# Patient Record
Sex: Male | Born: 1960
Health system: Southern US, Community
[De-identification: ages and names within clinical notes are randomized; demographics above are authoritative.]

## PROBLEM LIST (undated history)

## (undated) DIAGNOSIS — I10 Essential (primary) hypertension: Secondary | ICD-10-CM

## (undated) DIAGNOSIS — IMO0001 Reserved for inherently not codable concepts without codable children: Secondary | ICD-10-CM

## (undated) DIAGNOSIS — R188 Other ascites: Secondary | ICD-10-CM

## (undated) DIAGNOSIS — K7460 Unspecified cirrhosis of liver: Secondary | ICD-10-CM

## (undated) DIAGNOSIS — K439 Ventral hernia without obstruction or gangrene: Secondary | ICD-10-CM

## (undated) HISTORY — DX: Unspecified cirrhosis of liver: K74.60

## (undated) HISTORY — PX: HAND SURGERY: SHX662

---

## 1998-11-19 HISTORY — PX: BACK SURGERY: SHX140

## 2000-12-31 ENCOUNTER — Encounter: Payer: Self-pay | Admitting: Neurosurgery

## 2000-12-31 ENCOUNTER — Ambulatory Visit (HOSPITAL_COMMUNITY): Admission: RE | Admit: 2000-12-31 | Discharge: 2000-12-31 | Payer: Self-pay | Admitting: Neurosurgery

## 2001-01-01 ENCOUNTER — Ambulatory Visit (HOSPITAL_COMMUNITY): Admission: RE | Admit: 2001-01-01 | Discharge: 2001-01-01 | Payer: Self-pay | Admitting: Neurosurgery

## 2001-01-08 ENCOUNTER — Ambulatory Visit (HOSPITAL_COMMUNITY): Admission: RE | Admit: 2001-01-08 | Discharge: 2001-01-08 | Payer: Self-pay | Admitting: Neurosurgery

## 2001-01-08 ENCOUNTER — Encounter: Payer: Self-pay | Admitting: Neurosurgery

## 2001-01-14 ENCOUNTER — Inpatient Hospital Stay (HOSPITAL_COMMUNITY): Admission: RE | Admit: 2001-01-14 | Discharge: 2001-01-15 | Payer: Self-pay | Admitting: Neurosurgery

## 2001-01-14 ENCOUNTER — Encounter: Payer: Self-pay | Admitting: Neurosurgery

## 2015-04-13 ENCOUNTER — Telehealth: Payer: Self-pay | Admitting: Internal Medicine

## 2015-04-13 ENCOUNTER — Emergency Department (HOSPITAL_COMMUNITY)
Admission: EM | Admit: 2015-04-13 | Discharge: 2015-04-13 | Disposition: A | Discharge status: Home / Self Care | Payer: Self-pay | Attending: Emergency Medicine | Admitting: Emergency Medicine

## 2015-04-13 ENCOUNTER — Encounter (HOSPITAL_COMMUNITY): Payer: Self-pay | Admitting: Emergency Medicine

## 2015-04-13 ENCOUNTER — Emergency Department (HOSPITAL_COMMUNITY): Payer: Self-pay

## 2015-04-13 DIAGNOSIS — R05 Cough: Secondary | ICD-10-CM | POA: Insufficient documentation

## 2015-04-13 DIAGNOSIS — K7031 Alcoholic cirrhosis of liver with ascites: Secondary | ICD-10-CM | POA: Insufficient documentation

## 2015-04-13 DIAGNOSIS — Z720 Tobacco use: Secondary | ICD-10-CM | POA: Insufficient documentation

## 2015-04-13 LAB — COMPREHENSIVE METABOLIC PANEL
ALT: 21 U/L (ref 17–63)
AST: 35 U/L (ref 15–41)
Albumin: 2.9 g/dL — ABNORMAL LOW (ref 3.5–5.0)
Alkaline Phosphatase: 78 U/L (ref 38–126)
Anion gap: 7 (ref 5–15)
BUN: 10 mg/dL (ref 6–20)
CO2: 26 mmol/L (ref 22–32)
Calcium: 8.4 mg/dL — ABNORMAL LOW (ref 8.9–10.3)
Chloride: 103 mmol/L (ref 101–111)
Creatinine, Ser: 0.79 mg/dL (ref 0.61–1.24)
GFR calc Af Amer: >60 mL/min (ref >60)
GFR calc non Af Amer: >60 mL/min (ref >60)
Glucose, Bld: 99 mg/dL (ref 65–99)
Potassium: 4.1 mmol/L (ref 3.5–5.1)
Sodium: 136 mmol/L (ref 135–145)
Total Bilirubin: 2.2 mg/dL — ABNORMAL HIGH (ref 0.3–1.2)
Total Protein: 6.4 g/dL — ABNORMAL LOW (ref 6.5–8.1)

## 2015-04-13 LAB — CBC WITH DIFFERENTIAL/PLATELET
Basophils Absolute: 0.1 10*3/uL (ref 0.0–0.1)
Basophils Relative: 1 % (ref 0–1)
Eosinophils Absolute: 0.1 10*3/uL (ref 0.0–0.7)
Eosinophils Relative: 1 % (ref 0–5)
HCT: 40.5 % (ref 39.0–52.0)
Hemoglobin: 13.8 g/dL (ref 13.0–17.0)
Lymphocytes Relative: 30 % (ref 12–46)
Lymphs Abs: 2.3 10*3/uL (ref 0.7–4.0)
MCH: 33.8 pg (ref 26.0–34.0)
MCHC: 34.1 g/dL (ref 30.0–36.0)
MCV: 99.3 fL (ref 78.0–100.0)
Monocytes Absolute: 0.7 10*3/uL (ref 0.1–1.0)
Monocytes Relative: 9 % (ref 3–12)
NEUTROS PCT: 59 % (ref 43–77)
Neutro Abs: 4.5 10*3/uL (ref 1.7–7.7)
PLATELETS: 179 10*3/uL (ref 150–400)
RBC: 4.08 MIL/uL — AB (ref 4.22–5.81)
RDW: 13.4 % (ref 11.5–15.5)
WBC: 7.6 10*3/uL (ref 4.0–10.5)

## 2015-04-13 LAB — BRAIN NATRIURETIC PEPTIDE: B Natriuretic Peptide: 50.1 pg/mL (ref 0.0–100.0)

## 2015-04-13 LAB — I-STAT TROPONIN, ED: Troponin i, poc: 0 ng/mL (ref 0.00–0.08)

## 2015-04-13 MED ORDER — IPRATROPIUM-ALBUTEROL 0.5-2.5 (3) MG/3ML IN SOLN
3.0000 mL | Freq: Once | RESPIRATORY_TRACT | Status: AC
  Administered 2015-04-13: 3 mL via RESPIRATORY_TRACT
  Filled 2015-04-13: qty 3

## 2015-04-13 MED ORDER — IOHEXOL 300 MG/ML  SOLN
100.0000 mL | Freq: Once | INTRAMUSCULAR | Status: AC | PRN
  Administered 2015-04-13: 100 mL via INTRAVENOUS

## 2015-04-13 MED ORDER — SPIRONOLACTONE 50 MG PO TABS: 50.0000 mg | ORAL_TABLET | Freq: Every day | ORAL | Status: DC

## 2015-04-13 MED ORDER — SPIRONOLACTONE 50 MG PO TABS
50.0000 mg | ORAL_TABLET | Freq: Every day | ORAL | Status: DC
  Administered 2015-04-13: 50 mg via ORAL
  Filled 2015-04-13: qty 1

## 2015-04-13 MED ORDER — FUROSEMIDE 40 MG PO TABS: 40.0000 mg | ORAL_TABLET | Freq: Every day | ORAL | Status: DC

## 2015-04-13 NOTE — Discharge Instructions (Signed)
Cirrhosis Cirrhosis is a condition of scarring of the liver which is caused when the liver has tried repairing itself following damage. This damage may come from a previous infection such as one of the forms of hepatitis (usually hepatitis C), or the damage may come from being injured by toxins. The main toxin that causes this damage is alcohol. The scarring of the liver from use of alcohol is irreversible. That means the liver cannot return to normal even though alcohol is not used any more. The main danger of hepatitis C infection is that it may cause long-lasting (chronic) liver disease, and this also may lead to cirrhosis. This complication is progressive and irreversible. CAUSES  Prior to available blood tests, hepatitis C could be contracted by blood transfusions. Since testing of blood has improved, this is now unlikely. This infection can also be contracted through intravenous drug use and the sharing of needles. It can also be contracted through sexual relationships. The injury caused by alcohol comes from too much use. It is not a few drinks that poison the liver, but years of misuse. Usually there will be some signs and symptoms early with scarring of the liver that suggest the development of better habits. Alcohol should never be used while using acetaminophen. A small dose of both taken together may cause irreversible damage to the liver. HOME CARE INSTRUCTIONS  There is no specific treatment for cirrhosis. However, there are things you can do to avoid making the condition worse.  Rest as needed.  Eat a well-balanced diet. Your caregiver can help you with suggestions.  Vitamin supplements including vitamins A, K, D, and thiamine can help.  A low-salt diet, water restriction, or diuretic medicine may be needed to reduce fluid retention.  Avoid alcohol. This can be extremely toxic if combined with acetaminophen.  Avoid drugs which are toxic to the liver. Some of these include isoniazid,  methyldopa, acetaminophen, anabolic steroids (muscle-building drugs), erythromycin, and oral contraceptives (birth control pills). Check with your caregiver to make sure medicines you are presently taking will not be harmful.  Periodic blood tests may be required. Follow your caregiver's advice regarding the timing of these.  Milk thistle is an herbal remedy which does protect the liver against toxins. However, it will not help once the liver has been scarred. SEEK MEDICAL CARE IF:  You have increasing fatigue or weakness.  You develop swelling of the hands, feet, legs, or face.  You vomit bright red blood, or a coffee ground appearing material.  You have blood in your stools, or the stools turn black and tarry.  You have a fever.  You develop loss of appetite, or have nausea and vomiting.  You develop jaundice.  You develop easy bruising or bleeding.  You have worsening of any of the problems you are concerned about. Document Released: 11/05/2005 Document Revised: 01/28/2012 Document Reviewed: 06/23/2008 [facility] Patient Information 2015 [place], [place]. This information is not intended to replace advice given to you by your health care provider. Make sure you discuss any questions you have with your health care provider.  Ascites Ascites is a gathering of fluid in the belly (abdomen). This is most often caused by liver disease. It may also be caused by a number of other less common problems. It causes a ballooning out (distension) of the abdomen. CAUSES  Scarring of the liver (cirrhosis) is the most common cause of ascites. Other causes include:  Infection or inflammation in the abdomen.  Cancer in the abdomen.  Heart failure.  Certain forms of kidney failure (nephritic syndrome).  Inflammation of the pancreas.  Clots in the veins of the liver. SYMPTOMS  In the early stages of ascites, you may not have any symptoms. The main symptom of ascites is a sense of abdominal  bloating. This is due to the presence of fluid. This may also cause an increase in abdominal or waist size. People with this condition can develop swelling in the legs, and men can develop a swollen scrotum. When there is a lot of fluid, it may be hard to breath. Stretching of the abdomen by fluid can be painful. DIAGNOSIS  Certain features of your medical history, such as a history of liver disease and of an enlarging abdomen, can suggest the presence of ascites. The diagnosis of ascites can be made on physical exam by your caregiver. An abdominal ultrasound examination can confirm that ascites is present, and estimate the amount of fluid. Once ascites is confirmed, it is important to determine its cause. Again, a history of one of the conditions listed in "CAUSES" provides a strong clue. A physical exam is important, and blood and X-ray tests may be needed. During a procedure called paracentesis, a sample of fluid is removed from the abdomen. This can determine certain key features about the fluid, such as whether or not infection or cancer is present. Your caregiver will determine if a paracentesis is necessary. They will describe the procedure to you. PREVENTION  Ascites is a complication of other conditions. Therefore to prevent ascites, you must seek treatment for any significant health conditions you have. Once ascites is present, careful attention to fluid and salt intake may help prevent it from getting worse. If you have ascites, you should not drink alcohol. PROGNOSIS  The prognosis of ascites depends on the underlying disease. If the disease is reversible, such as with certain infections or with heart failure, then ascites may improve or disappear. When ascites is caused by cirrhosis, then it indicates that the liver disease has worsened, and further evaluation and treatment of the liver disease is needed. If your ascites is caused by cancer, then the success or failure of the cancer treatment  will determine whether your ascites will improve or worsen. RISKS AND COMPLICATIONS  Ascites is likely to worsen if it is not properly diagnosed and treated. A large amount of ascites can cause pain and difficulty breathing. The main complication, besides worsening, is infection (called spontaneous bacterial peritonitis). This requires prompt treatment. TREATMENT  The treatment of ascites depends on its cause. When liver disease is your cause, medical management using water pills (diuretics) and decreasing salt intake is often effective. Ascites due to peritoneal inflammation or malignancy (cancer) alone does not respond to salt restriction and diuretics. Hospitalization is sometimes required. If the treatment of ascites cannot be managed with medications, a number of other treatments are available. Your caregivers will help you decide which will work best for you. Some of these are:  Removal of fluid from the abdomen (paracentesis).  Fluid from the abdomen is passed into a vein (peritoneovenous shunting).  Liver transplantation.  Transjugular intrahepatic portosystemic stent shunt. HOME CARE INSTRUCTIONS  It is important to monitor body weight and the intake and output of fluids. Weigh yourself at the same time every day. Record your weights. Fluid restriction may be necessary. It is also important to know your salt intake. The more salt you take in, the more fluid you will retain. Ninety percent of people with ascites  respond to this approach.  Follow any directions for medicines carefully.  Follow up with your caregiver, as directed.  Report any changes in your health, especially any new or worsening symptoms.  If your ascites is from liver disease, avoid alcohol and other substances toxic to the liver. SEEK MEDICAL CARE IF:   Your weight increases more than a few pounds in a few days.  Your abdominal or waist size increases.  You develop swelling in your legs.  You had swelling  and it worsens. SEEK IMMEDIATE MEDICAL CARE IF:   You develop a fever.  You develop new abdominal pain.  You develop difficulty breathing.  You develop confusion.  You have bleeding from the mouth, stomach, or rectum. MAKE SURE YOU:   Understand these instructions.  Will watch your condition.  Will get help right away if you are not doing well or get worse. Document Released: 11/05/2005 Document Revised: 01/28/2012 Document Reviewed: 06/06/2007 [facility] Patient Information 2015 [place], [place]. This information is not intended to replace advice given to you by your health care provider. Make sure you discuss any questions you have with your health care provider.

## 2015-04-13 NOTE — ED Provider Notes (Signed)
CSN: [id number]     Arrival date & time 04/13/15  1258 History   First MD Initiated Contact with Patient 04/13/15 1538     Chief Complaint  Patient presents with  . Leg Swelling     (Consider location/radiation/quality/duration/timing/severity/associated sxs/prior Treatment) HPI The patient reports he has had large swelling of his abdomen and legs that have been developing over several months duration. Now he reports his abdomen is so tense and there is somewhat pressure that it is making him short of breath. He denies specific pain but just feels an intense amount of tightness in his abdomen. He quit drinking alcohol approximately 3 months ago. Prior to that he reports a daily alcohol history particularly heavy over the 5 years prior. The patient also is a daily smoker. He does endorse morning cough he has not had fever or chest pain. He has not seen a physician in many years. He has no known medical history. History reviewed. No pertinent past medical history. History reviewed. No pertinent past surgical history. History reviewed. No pertinent family history. History  Substance Use Topics  . Smoking status: Current Every Day Smoker  . Smokeless tobacco: Not on file  . Alcohol Use: No     Comment: former heavy drinker     Review of Systems  10 Systems reviewed and are negative for acute change except as noted in the HPI.   Allergies  Review of patient's allergies indicates no known allergies.  Home Medications   Prior to Admission medications   Medication Sig Start Date End Date Taking? Authorizing Provider  furosemide (LASIX) 40 MG tablet Take 1 tablet (40 mg total) by mouth daily. 04/13/15   Charlesetta Shanks, MD  spironolactone (ALDACTONE) 50 MG tablet Take 1 tablet (50 mg total) by mouth daily. 04/13/15   Charlesetta Shanks, MD   BP 121/90 mmHg  Pulse 71  Temp(Src) 98.4 F (36.9 C) (Oral)  Resp 19  SpO2 100% Physical Exam  Constitutional: He is oriented to person, place, and  time. He appears well-developed and well-nourished.  HENT:  Head: Normocephalic and atraumatic.  Eyes: EOM are normal. Pupils are equal, round, and reactive to light.  Neck: Neck supple.  Cardiovascular: Normal rate, regular rhythm, normal heart sounds and intact distal pulses.   Pulmonary/Chest: Effort normal and breath sounds normal.  Abdominal: Soft. Bowel sounds are normal. He exhibits distension. There is no tenderness.  Patient's abdomen is very distended and tense. There is erythema over the lower and mid abdomen diffusely.  Musculoskeletal: Normal range of motion. He exhibits edema.  Bilateral 3+ pitting edema lower legs. No localizing calf tenderness. Erythema over the dorsum of both feet.  Neurological: He is alert and oriented to person, place, and time. He has normal strength. Coordination normal. GCS eye subscore is 4. GCS verbal subscore is 5. GCS motor subscore is 6.  Skin: Skin is warm, dry and intact.  Psychiatric: He has a normal mood and affect.    ED Course  Procedures (including critical care time) Labs Review Labs Reviewed  CBC WITH DIFFERENTIAL/PLATELET - Abnormal; Notable for the following:    RBC 4.08 (*)    All other components within normal limits  COMPREHENSIVE METABOLIC PANEL - Abnormal; Notable for the following:    Calcium 8.4 (*)    Total Protein 6.4 (*)    Albumin 2.9 (*)    Total Bilirubin 2.2 (*)    All other components within normal limits  BRAIN NATRIURETIC PEPTIDE  HEPATITIS PANEL, ACUTE  Randolm Idol, ED    Imaging Review Dg Chest 2 View  04/13/2015   CLINICAL DATA:  Shortness of breath and lower extremity edema for 3 weeks  EXAM: CHEST  2 VIEW  COMPARISON:  None.  FINDINGS: There is a calcified granuloma in the posterior left mid lung region. There is no edema or consolidation. Heart size and pulmonary vascularity are normal. No adenopathy. No bone lesions.  IMPRESSION: Calcified granuloma left mid lung posteriorly. No edema or  consolidation.   Electronically Signed   By: Lowella Grip III M.D.   On: 04/13/2015 14:22   Ct Abdomen Pelvis W Contrast  04/13/2015   CLINICAL DATA:  Leg an abdominal swelling for several months. History of smoking and alcohol use.  EXAM: CT ABDOMEN AND PELVIS WITH CONTRAST  TECHNIQUE: Multidetector CT imaging of the abdomen and pelvis was performed using the standard protocol following bolus administration of intravenous contrast.  CONTRAST:  117mL OMNIPAQUE IOHEXOL 300 MG/ML  SOLN  COMPARISON:  None.  FINDINGS: Nodular contour of the liver compatible with cirrhotic change. No discrete hepatic lesions on this non CTA examination. Normal appearance of the gallbladder. No radiopaque gallstones. No intra extrahepatic biliary duct dilatation. Moderate volume intra-abdominal ascites. The portal vein appears patent. No definitive hypertrophied gastroesophageal varices. Borderline splenomegaly with the spleen measuring 12.6 cm punctate calcification within the spleen, likely the sequela of prior granulomatous infection.  There is symmetric enhancement and excretion of the bilateral kidneys. No definite renal stones this postcontrast examination. No renal lesions. No urine obstruction a perinephric stranding. Normal appearance of the bilateral adrenal glands and pancreas.  Scatter minimal colonic diverticulosis without evidence of diverticulitis. The bowel is normal in course and caliber without discrete area of wall thickening given the presence of intra-abdominal ascites. The appendix is not visualized, however there is no pericecal inflammatory change. No pneumoperitoneum, pneumatosis or portal venous gas.  Moderate amount of mixed calcified and noncalcified atherosclerotic plaque with a normal caliber abdominal aorta. The major branch vessels of the abdominal aorta appear patent on this non CTA examination. No bulky retroperitoneal, pelvic or inguinal lymphadenopathy.  Mesenteric scattered calcifications  within normal sized prostate gland. Normal appearance of the urinary bladder given underdistention. Limited visualization of the lower thorax is negative for focal airspace opacity or pleural effusion.  Normal heart size. Coronary artery calcifications. Trace amount of pericardial fluid, presumably physiologic.  No acute or aggressive osseous abnormalities. Mild-to-moderate multilevel lumbar spine DDD, worse at L4-L5 with disc space height loss, endplate irregularity and small posteriorly directed disc osteophyte complex. Mild-to-moderate bilateral facet degenerative change of the lower lumbar spine.  Moderate diffuse body wall anasarca, most conspicuous about the bilateral flanks. Small bilateral mesenteric fat containing inguinal hernias.  IMPRESSION: 1. Stigmata of cirrhosis and portal venous hypertension including borderline splenomegaly and moderate volume intra-abdominal ascites. No discrete hepatic lesions on this non arterial phase examination. Further evaluation with nonemergent abdominal MRI could be performed as clinically indicated. 2. Minimal colonic diverticulosis without evidence of diverticulitis.   Electronically Signed   By: Sandi Mariscal M.D.   On: 04/13/2015 19:10     EKG Interpretation None     Consult: Patient's case is been discussed with Dr. Valentino Hue he approves starting the patient on spironolactone 50 mg daily and Lasix 40 mg daily with a follow-up in the office. MDM   Final diagnoses:  Ascites due to alcoholic cirrhosis   The patient appears her chronic alcoholic cirrhosis with ascites. Consultations have been made to  gastroenterology and an interventional radiology for establishing ongoing care for the patient. At this time he is stable for discharge with a follow-up plan of paracentesis tomorrow by interventional radiology and follow up with gastroenterology this week.     Charlesetta Shanks, MD 04/15/15 [id number]

## 2015-04-13 NOTE — Telephone Encounter (Signed)
In ED with ascites, CT shows cirrhosis - hx EtOH - stopped 3 months ago  Does not need admission  Advised try to schedule paracentesis  Start furosemide and spironolactone  We will contact patient tomorrow to arrange appt in next several days

## 2015-04-13 NOTE — ED Notes (Signed)
Pt sts leg and abd swelling x several months getting more severe; pt sts quit drinking ETOH 3 months ago; pt sts SOB due to fluid in abd area

## 2015-04-13 NOTE — ED Notes (Signed)
Patient sent for Scan by another staff member

## 2015-04-14 ENCOUNTER — Other Ambulatory Visit: Payer: Self-pay | Admitting: Radiology

## 2015-04-14 DIAGNOSIS — R188 Other ascites: Secondary | ICD-10-CM

## 2015-04-14 LAB — HEPATITIS PANEL, ACUTE
HCV Ab: NEGATIVE
HEP B C IGM: NONREACTIVE
Hep A IgM: NONREACTIVE
Hepatitis B Surface Ag: NEGATIVE

## 2015-04-14 NOTE — Telephone Encounter (Signed)
Left message for patient to call back  

## 2015-04-15 ENCOUNTER — Ambulatory Visit (HOSPITAL_COMMUNITY): Admission: RE | Admit: 2015-04-15 | Payer: Self-pay | Source: Ambulatory Visit

## 2015-04-15 NOTE — Telephone Encounter (Signed)
Left message for patient to call back  

## 2015-04-19 ENCOUNTER — Encounter: Payer: Self-pay | Admitting: Physician Assistant

## 2015-04-19 NOTE — Telephone Encounter (Signed)
OK We have tried to contact him

## 2015-04-19 NOTE — Telephone Encounter (Signed)
No return call from the patient. I left another message from him to call if he chooses to pursue an appt here.

## 2015-04-21 ENCOUNTER — Ambulatory Visit (HOSPITAL_COMMUNITY)
Admission: RE | Admit: 2015-04-21 | Discharge: 2015-04-21 | Disposition: A | Discharge status: Home / Self Care | Payer: Self-pay | Source: Ambulatory Visit | Attending: Radiology | Admitting: Radiology

## 2015-04-21 DIAGNOSIS — R188 Other ascites: Secondary | ICD-10-CM | POA: Insufficient documentation

## 2015-04-21 LAB — BODY FLUID CELL COUNT WITH DIFFERENTIAL
Lymphs, Fluid: 37 %
Monocyte-Macrophage-Serous Fluid: 58 % (ref 50–90)
Neutrophil Count, Fluid: 5 % (ref 0–25)
Total Nucleated Cell Count, Fluid: 425 cu mm (ref 0–1000)

## 2015-04-21 LAB — ALBUMIN, FLUID (OTHER): Albumin, Fluid: 1.1 g/dL

## 2015-04-21 NOTE — Procedures (Signed)
Successful US guided paracentesis from RLQ.  Yielded 6 Liters of hazy yellow fluid.  No immediate complications.  Pt tolerated well.   Specimen was sent for labs.  Gareth Eagle R PA-C 04/21/2015 2:26 PM

## 2015-04-22 LAB — PATHOLOGIST SMEAR REVIEW

## 2015-04-24 LAB — BODY FLUID CULTURE: Culture: NO GROWTH

## 2015-05-10 ENCOUNTER — Ambulatory Visit: Payer: Self-pay | Admitting: Physician Assistant

## 2015-06-21 ENCOUNTER — Emergency Department (HOSPITAL_COMMUNITY): Payer: Self-pay

## 2015-06-21 ENCOUNTER — Telehealth: Payer: Self-pay

## 2015-06-21 ENCOUNTER — Emergency Department (HOSPITAL_COMMUNITY)
Admission: EM | Admit: 2015-06-21 | Discharge: 2015-06-21 | Disposition: A | Discharge status: Home / Self Care | Payer: Self-pay | Attending: Emergency Medicine | Admitting: Emergency Medicine

## 2015-06-21 ENCOUNTER — Encounter (HOSPITAL_COMMUNITY): Payer: Self-pay | Admitting: Emergency Medicine

## 2015-06-21 DIAGNOSIS — R2243 Localized swelling, mass and lump, lower limb, bilateral: Secondary | ICD-10-CM | POA: Insufficient documentation

## 2015-06-21 DIAGNOSIS — Z720 Tobacco use: Secondary | ICD-10-CM | POA: Insufficient documentation

## 2015-06-21 DIAGNOSIS — R0602 Shortness of breath: Secondary | ICD-10-CM | POA: Insufficient documentation

## 2015-06-21 DIAGNOSIS — R188 Other ascites: Secondary | ICD-10-CM | POA: Insufficient documentation

## 2015-06-21 HISTORY — DX: Other ascites: R18.8

## 2015-06-21 LAB — COMPREHENSIVE METABOLIC PANEL
ALK PHOS: 113 U/L (ref 38–126)
ALT: 20 U/L (ref 17–63)
AST: 37 U/L (ref 15–41)
Albumin: 3.1 g/dL — ABNORMAL LOW (ref 3.5–5.0)
Anion gap: 8 (ref 5–15)
BUN: 12 mg/dL (ref 6–20)
CALCIUM: 8.6 mg/dL — AB (ref 8.9–10.3)
CO2: 24 mmol/L (ref 22–32)
CREATININE: 0.77 mg/dL (ref 0.61–1.24)
Chloride: 101 mmol/L (ref 101–111)
GFR calc non Af Amer: >60 mL/min (ref >60)
Glucose, Bld: 102 mg/dL — ABNORMAL HIGH (ref 65–99)
Potassium: 4 mmol/L (ref 3.5–5.1)
SODIUM: 133 mmol/L — AB (ref 135–145)
Total Bilirubin: 2.4 mg/dL — ABNORMAL HIGH (ref 0.3–1.2)
Total Protein: 6.9 g/dL (ref 6.5–8.1)

## 2015-06-21 LAB — CBC WITH DIFFERENTIAL/PLATELET
Basophils Absolute: 0.1 10*3/uL (ref 0.0–0.1)
Basophils Relative: 1 % (ref 0–1)
EOS ABS: 0.2 10*3/uL (ref 0.0–0.7)
Eosinophils Relative: 3 % (ref 0–5)
HCT: 40.4 % (ref 39.0–52.0)
Hemoglobin: 14 g/dL (ref 13.0–17.0)
LYMPHS ABS: 1.6 10*3/uL (ref 0.7–4.0)
Lymphocytes Relative: 20 % (ref 12–46)
MCH: 33.7 pg (ref 26.0–34.0)
MCHC: 34.7 g/dL (ref 30.0–36.0)
MCV: 97.1 fL (ref 78.0–100.0)
MONO ABS: 0.7 10*3/uL (ref 0.1–1.0)
MONOS PCT: 9 % (ref 3–12)
NEUTROS PCT: 67 % (ref 43–77)
Neutro Abs: 5.3 10*3/uL (ref 1.7–7.7)
PLATELETS: 198 10*3/uL (ref 150–400)
RBC: 4.16 MIL/uL — AB (ref 4.22–5.81)
RDW: 14.4 % (ref 11.5–15.5)
WBC: 7.9 10*3/uL (ref 4.0–10.5)

## 2015-06-21 LAB — BRAIN NATRIURETIC PEPTIDE: B NATRIURETIC PEPTIDE 5: 181.4 pg/mL — AB (ref 0.0–100.0)

## 2015-06-21 LAB — I-STAT TROPONIN, ED: Troponin i, poc: 0 ng/mL (ref 0.00–0.08)

## 2015-06-21 LAB — LIPASE, BLOOD: LIPASE: 27 U/L (ref 22–51)

## 2015-06-21 MED ORDER — FUROSEMIDE 40 MG PO TABS: 40.0000 mg | ORAL_TABLET | Freq: Every day | ORAL | Status: DC

## 2015-06-21 MED ORDER — SPIRONOLACTONE 50 MG PO TABS: 50.0000 mg | ORAL_TABLET | Freq: Every day | ORAL | Status: DC

## 2015-06-21 MED ORDER — LIDOCAINE HCL (PF) 1 % IJ SOLN
INTRAMUSCULAR | Status: AC
  Filled 2015-06-21: qty 10

## 2015-06-21 NOTE — Telephone Encounter (Signed)
Call received from Fuller Mandril, [place] requesting a hospital follow up appointment for the patient and and appointment was scheduled for 06/28/15 @ 1000 w/ Dr Jarold Song. The information was added to the AVS.

## 2015-06-21 NOTE — ED Notes (Signed)
Pt here with abd pain and distention x months; pt sts diuretic meds ran out end of June and has been here before for same; pt sts swelling in legs; large abd distention noted; pt with hx of liver failure and did not follow up with PCP

## 2015-06-21 NOTE — ED Notes (Signed)
Pt is in stable condition upon d/c and is escorted from ED via wheelchair. 

## 2015-06-21 NOTE — ED Notes (Signed)
Patient transported to X-ray 

## 2015-06-21 NOTE — ED Notes (Signed)
Dr. Darl Householder at bedside for ultrasound of abdomen.

## 2015-06-21 NOTE — ED Provider Notes (Signed)
CSN: [id number]     Arrival date & time 06/21/15  1017 History   First MD Initiated Contact with Patient 06/21/15 1025     Chief Complaint  Patient presents with  . Leg Swelling  . Abdominal Pain     (Consider location/radiation/quality/duration/timing/severity/associated sxs/prior Treatment) Patient is a 54 y.o. male presenting with abdominal pain. The history is provided by the patient and medical records.  Abdominal Pain   This is a 54 year old male with history of alcoholic cirrhosis, presenting to the ED for abdominal pain and distention for the past 2 months. Patient was previously on Lasix and Aldactone, states he ran out of these medications at the end of June. He states since this time he has had increased swelling in his legs and abdomen. He denies any nausea, vomiting, or diarrhea. No reported fever or chills. Patient states he is only able to lie flat for 30 minutes at a time or less as he feels there is fluid in his longest period patient was sent to a specialist, however did not have money for co-pay and could not be seen. He does not have a primary care physician at this time. Patient does report some occasional chest pain which he thinks is due to fluid. Patient is a daily smoker. He has no known history of coronary artery disease or MI.  VSS.  Past Medical History  Diagnosis Date  . Ascites    History reviewed. No pertinent past surgical history. History reviewed. No pertinent family history. History  Substance Use Topics  . Smoking status: Current Every Day Smoker  . Smokeless tobacco: Not on file  . Alcohol Use: No     Comment: former heavy drinker     Review of Systems  Gastrointestinal: Positive for abdominal pain and abdominal distention.  All other systems reviewed and are negative.     Allergies  Review of patient's allergies indicates no known allergies.  Home Medications   Prior to Admission medications   Medication Sig Start Date End Date Taking?  Authorizing Provider  furosemide (LASIX) 40 MG tablet Take 1 tablet (40 mg total) by mouth daily. 04/13/15   Charlesetta Shanks, MD  spironolactone (ALDACTONE) 50 MG tablet Take 1 tablet (50 mg total) by mouth daily. 04/13/15   Charlesetta Shanks, MD   BP 127/75 mmHg  Pulse 98  Temp(Src) 98.5 F (36.9 C) (Oral)  Resp 22  SpO2 98%   Physical Exam  Constitutional: He is oriented to person, place, and time. He appears well-developed and well-nourished. No distress.  HENT:  Head: Normocephalic and atraumatic.  Mouth/Throat: Oropharynx is clear and moist.  Eyes: Conjunctivae and EOM are normal. Pupils are equal, round, and reactive to light.  Neck: Normal range of motion. Neck supple.  Cardiovascular: Normal rate, regular rhythm and normal heart sounds.   Pulmonary/Chest: Effort normal. No respiratory distress. He has no wheezes. He has rales (mild).  Abdominal: Soft. Bowel sounds are normal. He exhibits distension, fluid wave and ascites.  Musculoskeletal: Normal range of motion. He exhibits edema (2+ bilaterally).  Neurological: He is alert and oriented to person, place, and time.  Skin: Skin is warm and dry. He is not diaphoretic.  Psychiatric: He has a normal mood and affect.  Nursing note and vitals reviewed.   ED Course  Procedures (including critical care time) Labs Review Labs Reviewed  CBC WITH DIFFERENTIAL/PLATELET - Abnormal; Notable for the following:    RBC 4.16 (*)    All other components within normal limits  COMPREHENSIVE METABOLIC PANEL - Abnormal; Notable for the following:    Sodium 133 (*)    Glucose, Bld 102 (*)    Calcium 8.6 (*)    Albumin 3.1 (*)    Total Bilirubin 2.4 (*)    All other components within normal limits  BRAIN NATRIURETIC PEPTIDE - Abnormal; Notable for the following:    B Natriuretic Peptide 181.4 (*)    All other components within normal limits  LIPASE, BLOOD  I-STAT TROPOININ, ED    Imaging Review Dg Chest 2 View  06/21/2015   CLINICAL DATA:   Short of breath  EXAM: CHEST  2 VIEW  COMPARISON:  04/13/2015  FINDINGS: Heart size and vascularity normal. Mild bibasilar atelectasis/ scarring unchanged. Calcified granuloma superior segment left lower lobe is unchanged. Negative for heart failure or pneumonia. No effusion.  IMPRESSION: No active cardiopulmonary disease.   Electronically Signed   By: Franchot Gallo M.D.   On: 06/21/2015 11:56   US Paracentesis  06/21/2015   INDICATION: Ascites, request for a paracentesis.  EXAM: ULTRASOUND-GUIDED PARACENTESIS  COMPARISON:  Paracentesis 04/21/15.  MEDICATIONS: None.  COMPLICATIONS: None immediate  TECHNIQUE: Informed written consent was obtained from the patient after a discussion of the risks, benefits and alternatives to treatment. A timeout was performed prior to the initiation of the procedure.  Initial ultrasound scanning demonstrates a large amount of ascites within the right lower abdominal quadrant. The right lower abdomen was prepped and draped in the usual sterile fashion. 1% lidocaine was used for local anesthesia.  Under direct ultrasound guidance, a 19 gauge, 10-cm, Yueh catheter was introduced. An ultrasound image was saved for documentation purposed. The paracentesis was performed. The catheter was removed and a dressing was applied. The patient tolerated the procedure well without immediate post procedural complication.  FINDINGS: A total of approximately 8.4 liters of serous fluid was removed.  IMPRESSION: Successful ultrasound-guided paracentesis yielding 8.4 liters of peritoneal fluid.  Read By:  Tsosie Billing PA-C   Electronically Signed   By: Jerilynn Mages.  Shick M.D.   On: 06/21/2015 14:14     EKG Interpretation   Date/Time:  Tuesday June 21 2015 10:45:38 EDT Ventricular Rate:  102 PR Interval:  202 QRS Duration: 79 QT Interval:  355 QTC Calculation: 462 R Axis:   0 Text Interpretation:  Sinus or ectopic atrial tachycardia Inferoposterior  infarct, recent No significant change since last  tracing Confirmed by YAO   MD, DAVID ([id number]) on 06/21/2015 11:40:52 AM      MDM   Final diagnoses:  SOB (shortness of breath)  Ascites   54 year old male here with abdominal distention and some intermittent shortness of breath. He has history of alcoholic cirrhosis and ascites, been off of his diuretics for the past month. Patient is afebrile, nontoxic. He does appear fluid overloaded.  His abdomen is significantly distended and he has peripheral edema. His vital signs remained stable on room air. Labwork as above. Chest x-ray is clear. Do not suspect SBP.  Bedside ultrasound performed by Dr. Darl Householder-- significant fluid noted in abdomen.  Will arrange u/s paracentesis.  U/s paracentesis completed, 8.4 liters drained.  Patient monitored for approx 1 hour afterwards, VS remain stable. No complaints of dizziness, lightheadedness, or feelings of syncope.  He states he feels significantly better after fluid drained.  Patient appears stable for discharge. Will restart his Lasix and Aldactone. Case manager has arranged [facility].  Discussed plan with patient, he/she acknowledged understanding and agreed  with plan of care.  Return precautions given for new or worsening symptoms.  Larene Pickett, PA-C 06/21/15 [place] Yao, MD 06/22/15 [phone]

## 2015-06-21 NOTE — Procedures (Signed)
Successful US guided paracentesis from RLQ.  Yielded 8.4 liters of serous.  No immediate complications.  Pt tolerated well.   Specimen was not sent for labs.  Tsosie Billing D PA-C 06/21/2015 2:05 PM

## 2015-06-21 NOTE — ED Notes (Signed)
Patient transported to Ultrasound 

## 2015-06-21 NOTE — Discharge Instructions (Signed)
Take the prescribed medication as directed. Follow-up with [facility]-- appt scheduled for you on 8/9 at 10:00AM Return to the ED for new or worsening symptoms.

## 2015-06-21 NOTE — Care Management Note (Signed)
Case Management Note  Patient Details  Name: Kevin Nicholson MRN: [id number] Date of Birth: 04/13/1961  Subjective/Objective:                54-year-old male here with abdominal distention and some intermittent shortness of breath. He has history of alcoholic cirrhosis and ascites, been off of his diuretics for the past month.// Home alone.             Action/Plan: Follow for disposition needs.  Expected Discharge Date:  06/21/15               Expected Discharge Plan:  Home/Self Care  In-House Referral:  PCP / [facility]  Discharge planning Services  CM Consult  Post Acute Care Choice:  NA Choice offered to:  NA  DME Arranged:    DME Agency:     HH Arranged:    HH Agency:     Status of Service:  Completed, signed off  Medicare Important Message Given:    Date Medicare IM Given:    Medicare IM give by:    Date Additional Medicare IM Given:    Additional Medicare Important Message give by:     If discussed at Long Length of Stay Meetings, dates discussed:    Additional Comments: Camellia J. Wood, RN, BSN, NCM [phone] ED CM consulted to meet with patient regarding PCP establishment and patient does not have insurance. Pt presented to [facility] ED today with abdominal distention and some intermittent shortness of breath. Met with patient at bedside, confirmed informaton. Pt  reports not having access to f/u care with PCP or insurance coverage. Discussed with patient importance and benefits of establishing PCP, and not utilizing the ED for primary care needs. Pt verbalized understanding and is in agreement. Discussed other options, provided list of local  affordable PCPs.  Pt voiced interest in [facility].  [facility] Brochure given with address, phone number, and the services highlighted.   Instructed pt that appointment is at [facility] 8/9 @1000.  Pt verbalizes understanding.  Wood, Camellia, RN 06/21/2015, 1:57  PM  

## 2015-06-28 ENCOUNTER — Ambulatory Visit: Payer: Self-pay | Attending: Family Medicine | Admitting: Family Medicine

## 2015-06-28 ENCOUNTER — Encounter: Payer: Self-pay | Admitting: Family Medicine

## 2015-06-28 VITALS — BP 116/80 | HR 96 | Temp 97.8°F | Ht 70.0 in | Wt 220.0 lb

## 2015-06-28 DIAGNOSIS — K7460 Unspecified cirrhosis of liver: Secondary | ICD-10-CM | POA: Insufficient documentation

## 2015-06-28 DIAGNOSIS — R0602 Shortness of breath: Secondary | ICD-10-CM | POA: Insufficient documentation

## 2015-06-28 DIAGNOSIS — R188 Other ascites: Secondary | ICD-10-CM | POA: Insufficient documentation

## 2015-06-28 DIAGNOSIS — F17210 Nicotine dependence, cigarettes, uncomplicated: Secondary | ICD-10-CM | POA: Insufficient documentation

## 2015-06-28 DIAGNOSIS — K7031 Alcoholic cirrhosis of liver with ascites: Secondary | ICD-10-CM

## 2015-06-28 DIAGNOSIS — R609 Edema, unspecified: Secondary | ICD-10-CM | POA: Insufficient documentation

## 2015-06-28 DIAGNOSIS — Z79899 Other long term (current) drug therapy: Secondary | ICD-10-CM | POA: Insufficient documentation

## 2015-06-28 LAB — POCT INR: INR: 1.2

## 2015-06-28 NOTE — Progress Notes (Signed)
Pt here for alcoholic cirrhosis known ascites w/ abd swelling, SOB, ran out of lasix, aldactone. U/S guided paracentesis by IR and took of 8 liters.  Pt states last drink was 58mo ago Pt states he's taken all meds as prescribed.  Complains  of SOB and abd pain 5/10

## 2015-06-28 NOTE — Progress Notes (Signed)
Kevin Nicholson, is a 54 y.o. male  ZJQ:[id number]  WIO:[id number]  DOB - Feb 28, 1961  CC:  Chief Complaint  Patient presents with  . Hospitalization Follow-up  . Cirrhosis       HPI: Dellas Guard is a 54 y.o. male with a history of alcoholic liver cirrhosis who was supposed to follow-up with GI but states he never went to see [facility] because he did not have the $180 to see them. He ran out of his medications resulting accumulation of ascites for which he presented to the ED at [facility] on 06/21/15 with abdominal pain and leg swelling.  His blood work revealed an elevated bilirubin of 2.4 otherwise other LFTs within normal. He underwent an IR guided abdominal paracentesis which yielded 8.4 L of serous fluid which was not sent off for cytology.  He comes in here today to establish medical care.Complains of some abdominal discomfort, pedal edema and mild shortness of breath. He has not drunk alcohol in 4 months. Patient has No headache, No chest pain, No Nausea, No new weakness tingling or numbness, No Cough   No Known Allergies Past Medical History  Diagnosis Date  . Ascites    Current Outpatient Prescriptions on File Prior to Visit  Medication Sig Dispense Refill  . furosemide (LASIX) 40 MG tablet Take 1 tablet (40 mg total) by mouth daily. 30 tablet 1  . spironolactone (ALDACTONE) 50 MG tablet Take 1 tablet (50 mg total) by mouth daily. 30 tablet 1   No current facility-administered medications on file prior to visit.   Family History  Problem Relation Age of Onset  . Cancer Mother   . Cancer Father    History   Social History  . Marital Status: Single    Spouse Name: N/A  . Number of Children: N/A  . Years of Education: N/A   Occupational History  . Not on file.   Social History Main Topics  . Smoking status: Current Every Day Smoker -- 1.00 packs/day  . Smokeless tobacco: Not on file  . Alcohol Use: No     Comment: former heavy drinker   . Drug Use: No    . Sexual Activity: Not on file   Other Topics Concern  . Not on file   Social History Narrative    Review of Systems: Constitutional: Negative for fever, chills, diaphoresis, activity change, appetite change and fatigue. HENT: Negative for ear pain, nosebleeds, congestion, facial swelling, rhinorrhea, neck pain, neck stiffness and ear discharge.  Eyes: Negative for pain, discharge, redness, itching and visual disturbance. Respiratory: Negative for cough, choking, chest tightness, positive for  shortness of breath, negative for wheezing and stridor.  Cardiovascular: Negative for chest pain, palpitations and positive for leg swelling. Gastrointestinal: positive for abdominal distention. Genitourinary: Negative for dysuria, urgency, frequency, hematuria, flank pain, decreased urine volume, difficulty urinating and dyspareunia.  Musculoskeletal: Negative for back pain, joint swelling, arthralgia and gait problem. Neurological: Negative for dizziness, tremors, seizures, syncope, facial asymmetry, speech difficulty, weakness, light-headedness, numbness and headaches.  Hematological: Negative for adenopathy. Does not bruise/bleed easily. Skin: Negative for rash, ulcer. Psychiatric/Behavioral: Negative for hallucinations, behavioral problems, confusion, dysphoric mood, decreased concentration and agitation.    Objective:   Filed Vitals:   06/28/15 1008  BP: 116/80  Pulse: 96  Temp: 97.8 F (36.6 C)    Physical Exam: Constitutional: Patient appears well-developed and well-nourished. No distress. HENT: Normocephalic, atraumatic, External right and left ear normal. Oropharynx is clear and moist.  Eyes:  Conjunctivae and EOM are normal. PERRLA, no scleral icterus. Neck: Normal ROM, No JVD. No tracheal deviation. No thyromegaly. CVS: RRR, S1/S2 +, no murmurs, no gallops, no carotid bruit.  Pulmonary: Effort and breath sounds normal, no stridor, rhonchi, wheezes, rales.  Abdominal: Massive  ascites, abdominal girth 49 inches.  Musculoskeletal: Normal range of motion. 3+ bilateral pedal edema.  Lymphadenopathy: No lymphadenopathy noted, cervical, inguinal or axillary Neuro: Alert. Normal reflexes, muscle tone coordination. No cranial nerve deficit. Skin: Skin is warm and dry. Spider nevi on abdominal wall.  Psychiatric: Normal mood and affect. Behavior, judgment, thought content normal.  Lab Results  Component Value Date   WBC 7.9 06/21/2015   HGB 14.0 06/21/2015   HCT 40.4 06/21/2015   MCV 97.1 06/21/2015   PLT 198 06/21/2015   Lab Results  Component Value Date   CREATININE 0.77 06/21/2015   BUN 12 06/21/2015   NA 133* 06/21/2015   K 4.0 06/21/2015   CL 101 06/21/2015   CO2 24 06/21/2015   CLINICAL DATA: Leg an abdominal swelling for several months. History of smoking and alcohol use.  EXAM: CT ABDOMEN AND PELVIS WITH CONTRAST  TECHNIQUE: Multidetector CT imaging of the abdomen and pelvis was performed using the standard protocol following bolus administration of intravenous contrast.  CONTRAST: 16mL OMNIPAQUE IOHEXOL 300 MG/ML SOLN  COMPARISON: None.  FINDINGS: Nodular contour of the liver compatible with cirrhotic change. No discrete hepatic lesions on this non CTA examination. Normal appearance of the gallbladder. No radiopaque gallstones. No intra extrahepatic biliary duct dilatation. Moderate volume intra-abdominal ascites. The portal vein appears patent. No definitive hypertrophied gastroesophageal varices. Borderline splenomegaly with the spleen measuring 12.6 cm punctate calcification within the spleen, likely the sequela of prior granulomatous infection.  There is symmetric enhancement and excretion of the bilateral kidneys. No definite renal stones this postcontrast examination. No renal lesions. No urine obstruction a perinephric stranding. Normal appearance of the bilateral adrenal glands and pancreas.  Scatter minimal  colonic diverticulosis without evidence of diverticulitis. The bowel is normal in course and caliber without discrete area of wall thickening given the presence of intra-abdominal ascites. The appendix is not visualized, however there is no pericecal inflammatory change. No pneumoperitoneum, pneumatosis or portal venous gas.  Moderate amount of mixed calcified and noncalcified atherosclerotic plaque with a normal caliber abdominal aorta. The major branch vessels of the abdominal aorta appear patent on this non CTA examination. No bulky retroperitoneal, pelvic or inguinal lymphadenopathy.  Mesenteric scattered calcifications within normal sized prostate gland. Normal appearance of the urinary bladder given underdistention. Limited visualization of the lower thorax is negative for focal airspace opacity or pleural effusion.  Normal heart size. Coronary artery calcifications. Trace amount of pericardial fluid, presumably physiologic.  No acute or aggressive osseous abnormalities. Mild-to-moderate multilevel lumbar spine DDD, worse at L4-L5 with disc space height loss, endplate irregularity and small posteriorly directed disc osteophyte complex. Mild-to-moderate bilateral facet degenerative change of the lower lumbar spine.  Moderate diffuse body wall anasarca, most conspicuous about the bilateral flanks. Small bilateral mesenteric fat containing inguinal hernias.  IMPRESSION: 1. Stigmata of cirrhosis and portal venous hypertension including borderline splenomegaly and moderate volume intra-abdominal ascites. No discrete hepatic lesions on this non arterial phase examination. Further evaluation with nonemergent abdominal MRI could be performed as clinically indicated. 2. Minimal colonic diverticulosis without evidence of diverticulitis.   Electronically Signed  By: Sandi Mariscal M.D.  On: 04/13/2015 19:10      Assessment and plan:  54 year old male patient with  cirrhosis of the liver with massive ascites. -Unable to afford GI  -MELD score ([facility]o)= 9 -Referred for ultrasound-guided paracentesis. -Continue furosemide and spironolactone. -We will reassess at the next office visit for initiation of beta blocker as his blood pressure is on the low side today.   The patient was given clear instructions to go to ER or return to medical center if symptoms don't improve, worsen or new problems develop. The patient verbalized understanding. The patient was told to call to get lab results if they haven't heard anything in the next week.     Arnoldo Morale, [facility] [phone] 06/28/2015, 10:29 AM

## 2015-07-04 ENCOUNTER — Ambulatory Visit (HOSPITAL_COMMUNITY): Payer: Self-pay

## 2015-07-08 ENCOUNTER — Ambulatory Visit (HOSPITAL_COMMUNITY)
Admission: RE | Admit: 2015-07-08 | Discharge: 2015-07-08 | Disposition: A | Discharge status: Home / Self Care | Payer: Medicaid Other | Source: Ambulatory Visit | Attending: Family Medicine | Admitting: Family Medicine

## 2015-07-08 DIAGNOSIS — R188 Other ascites: Secondary | ICD-10-CM | POA: Diagnosis present

## 2015-07-08 DIAGNOSIS — K7031 Alcoholic cirrhosis of liver with ascites: Secondary | ICD-10-CM

## 2015-07-08 NOTE — Procedures (Signed)
Para No comp/EBL

## 2015-07-15 ENCOUNTER — Encounter: Payer: Self-pay | Admitting: Family Medicine

## 2015-07-15 ENCOUNTER — Ambulatory Visit: Payer: Self-pay | Attending: Family Medicine | Admitting: Family Medicine

## 2015-07-15 ENCOUNTER — Ambulatory Visit: Payer: Self-pay

## 2015-07-15 VITALS — BP 110/72 | HR 96 | Temp 98.0°F | Resp 16 | Ht 71.0 in | Wt 212.6 lb

## 2015-07-15 DIAGNOSIS — R188 Other ascites: Secondary | ICD-10-CM

## 2015-07-15 DIAGNOSIS — K7031 Alcoholic cirrhosis of liver with ascites: Secondary | ICD-10-CM

## 2015-07-15 MED ORDER — FUROSEMIDE 40 MG PO TABS: 40.0000 mg | ORAL_TABLET | Freq: Every day | ORAL | Status: DC

## 2015-07-15 MED ORDER — LACTULOSE 10 GM/15ML PO SOLN: 10.0000 g | Freq: Two times a day (BID) | ORAL | Status: DC | PRN

## 2015-07-15 MED ORDER — SPIRONOLACTONE 50 MG PO TABS: 50.0000 mg | ORAL_TABLET | Freq: Every day | ORAL | Status: DC

## 2015-07-15 NOTE — Progress Notes (Signed)
Patient here for follow up on his ascites and cirrhosis  of his liver Patient states per his doctor she wanted him to come back in for re check

## 2015-07-15 NOTE — Progress Notes (Signed)
Subjective:    Patient ID: Kevin Nicholson, male    DOB: 1961/07/26, 54 y.o.   MRN: [id number]  HPI Kevin Nicholson is a 54 year old male patient with a history of ascites secondary to liver cirrhosis status post ultrasound-guided paracentesis on 06/21/15 in which 8.4 L of ascitic fluid was removed after which I had seen him at an office visit 2 weeks ago and referred him for a repeat abdominal paracentesis which he had on 07/08/15 with removal of 4 L of ascitic fluid. Fluid was not sent for cytology in both cases.  He remains compliant on his medications but still complains of abdominal bloating; he is short of breath but this is his baseline and he is not as dyspneic as he felt at his last office visit when he was referred for the paracentesis. Previously referred to [facility] but was unable to afford the co-pay.  Past Medical History  Diagnosis Date  . Ascites    History reviewed. No pertinent past surgical history.  Social History   Social History  . Marital Status: Single    Spouse Name: N/A  . Number of Children: N/A  . Years of Education: N/A   Occupational History  . Not on file.   Social History Main Topics  . Smoking status: Current Every Day Smoker -- 1.00 packs/day  . Smokeless tobacco: Not on file  . Alcohol Use: No     Comment: former heavy drinker   . Drug Use: No  . Sexual Activity: Not on file   Other Topics Concern  . Not on file   Social History Narrative    No Known Allergies  No current outpatient prescriptions on file prior to visit.   No current facility-administered medications on file prior to visit.      Review of Systems  Constitutional: Negative for fever, chills, diaphoresis, activity change, appetite change and fatigue. HENT: Negative for ear pain, nosebleeds, congestion, facial swelling, rhinorrhea, neck pain, neck stiffness and ear discharge.  Eyes: Negative for pain, discharge, redness, itching and visual disturbance. Respiratory:  Negative for cough, choking, chest tightness, positive for  shortness of breath which is at baseline, negative for wheezing and stridor.  Cardiovascular: Negative for chest pain, palpitations and positive for leg swelling. Gastrointestinal: positive for abdominal distention. Genitourinary: Negative for dysuria, urgency, frequency, hematuria, flank pain, decreased urine volume, difficulty urinating and dyspareunia.  Musculoskeletal: Negative for back pain, joint swelling, arthralgia and gait problem. Neurological: Negative for dizziness, tremors, seizures, syncope, facial asymmetry, speech difficulty, weakness, light-headedness, numbness and headaches.  Hematological: Negative for adenopathy. Does not bruise/bleed easily. Skin: Negative for rash, ulcer. Psychiatric/Behavioral: Negative for hallucinations, behavioral problems, confusion, dysphoric mood, decreased concentration and agitation.        Objective: Filed Vitals:   07/15/15 1414  BP: 110/72  Pulse: 96  Temp: 98 F (36.7 C)  Resp: 16  Height: 5\' 11"  (1.803 m)  Weight: 212 lb 9.6 oz (96.435 kg)  SpO2: 99%      Physical Exam Constitutional: Patient appears well-developed and well-nourished. No distress. HENT: Normocephalic, atraumatic, External right and left ear normal. Oropharynx is clear and moist.  Eyes: Conjunctivae and EOM are normal. PERRLA, no scleral icterus. Neck: Normal ROM, No JVD. No tracheal deviation. No thyromegaly. CVS: RRR, S1/S2 +, no murmurs, no gallops, no carotid bruit.  Pulmonary: Effort and breath sounds normal, no stridor, rhonchi, wheezes, rales.  Abdominal: Massive ascites, abdominal girth 48.5 inches.  Musculoskeletal: Normal range of motion. 3+  bilateral pedal edema.  Lymphadenopathy: No lymphadenopathy noted, cervical, inguinal or axillary Neuro: Alert. Normal reflexes, muscle tone coordination. No cranial nerve deficit. Skin: Skin is warm and dry. Spider nevi on abdominal wall. Multiple  erythematous scab on lower extremities Psychiatric: Normal mood and affect. Behavior, judgment, thought content normal.  CMP Latest Ref Rng 06/21/2015 04/13/2015  Glucose 65 - 99 mg/dL 102(H) 99  BUN 6 - 20 mg/dL 12 10  Creatinine 0.61 - 1.24 mg/dL 0.77 0.79  Sodium 135 - 145 mmol/L 133(L) 136  Potassium 3.5 - 5.1 mmol/L 4.0 4.1  Chloride 101 - 111 mmol/L 101 103  CO2 22 - 32 mmol/L 24 26  Calcium 8.9 - 10.3 mg/dL 8.6(L) 8.4(L)  Total Protein 6.5 - 8.1 g/dL 6.9 6.4(L)  Total Bilirubin 0.3 - 1.2 mg/dL 2.4(H) 2.2(H)  Alkaline Phos 38 - 126 U/L 113 78  AST 15 - 41 U/L 37 35  ALT 17 - 63 U/L 20 21         Assessment & Plan:  54 year old male patient with cirrhosis of the liver with massive ascites. -Previously unable to afford GI; referred to GI today.  -MELD score ([facility])= 9 -Ultrasound-guided paracentesis not indicated today; if symptoms worsens he will need one -Continue furosemide and spironolactone. -Will reassess at the next office visit for initiation of beta blocker as his blood pressure is on the low side today; he will also need an ammonia level.

## 2015-07-20 ENCOUNTER — Encounter: Payer: Self-pay | Admitting: Gastroenterology

## 2015-08-08 ENCOUNTER — Other Ambulatory Visit: Payer: Self-pay

## 2015-08-08 ENCOUNTER — Encounter: Payer: Self-pay | Admitting: Gastroenterology

## 2015-08-08 ENCOUNTER — Ambulatory Visit (INDEPENDENT_AMBULATORY_CARE_PROVIDER_SITE_OTHER): Payer: Self-pay | Admitting: Gastroenterology

## 2015-08-08 VITALS — BP 116/69 | HR 92 | Temp 98.1°F | Ht 71.0 in | Wt 216.8 lb

## 2015-08-08 DIAGNOSIS — R188 Other ascites: Secondary | ICD-10-CM

## 2015-08-08 DIAGNOSIS — K7031 Alcoholic cirrhosis of liver with ascites: Secondary | ICD-10-CM

## 2015-08-08 NOTE — Patient Instructions (Addendum)
Please go by [facility] and fill out financial assistance forms. This is where you need to have your blood work done.   We have scheduled you for another paracentesis.   We have also scheduled you for an upper endoscopy with Dr. Oneida Alar in the near future. If you change your mind about the routine screening colonoscopy, please let me know.  Please take the prescription for the hepatitis vaccinations to your doctor tomorrow to have started.   Strictly follow a low sodium (2 gram sodium) diet. I will likely adjust your fluid pills after reviewing updated blood work.   Return in 6-8 weeks.   Low-Sodium Eating Plan Sodium raises blood pressure and causes water to be held in the body. Getting less sodium from food will help lower your blood pressure, reduce any swelling, and protect your heart, liver, and kidneys. We get sodium by adding salt (sodium chloride) to food. Most of our sodium comes from canned, boxed, and frozen foods. Restaurant foods, fast foods, and pizza are also very high in sodium. Even if you take medicine to lower your blood pressure or to reduce fluid in your body, getting less sodium from your food is important. WHAT IS MY PLAN? Most people should limit their sodium intake to 2,300 mg a day. Your health care Muaz Shorey recommends that you limit your sodium intake to 2 grams a day.  WHAT DO I NEED TO KNOW ABOUT THIS EATING PLAN? For the low-sodium eating plan, you will follow these general guidelines:  Choose foods with a % Daily Value for sodium of less than 5% (as listed on the food label).   Use salt-free seasonings or herbs instead of table salt or sea salt.   Check with your health care Ryn Peine or pharmacist before using salt substitutes.   Eat fresh foods.  Eat more vegetables and fruits.  Limit canned vegetables. If you do use them, rinse them well to decrease the sodium.   Limit cheese to 1 oz (28 g) per day.   Eat lower-sodium products, often labeled as  "lower sodium" or "no salt added."  Avoid foods that contain monosodium glutamate (MSG). MSG is sometimes added to [place] food and some canned foods.  Check food labels (Nutrition Facts labels) on foods to learn how much sodium is in one serving.  Eat more home-cooked food and less restaurant, buffet, and fast food.  When eating at a restaurant, ask that your food be prepared with less salt or none, if possible.  HOW DO I READ FOOD LABELS FOR SODIUM INFORMATION? The Nutrition Facts label lists the amount of sodium in one serving of the food. If you eat more than one serving, you must multiply the listed amount of sodium by the number of servings. Food labels may also identify foods as:  Sodium free--Less than 5 mg in a serving.  Very low sodium--35 mg or less in a serving.  Low sodium--140 mg or less in a serving.  Light in sodium--50% less sodium in a serving. For example, if a food that usually has 300 mg of sodium is changed to become light in sodium, it will have 150 mg of sodium.  Reduced sodium--25% less sodium in a serving. For example, if a food that usually has 400 mg of sodium is changed to reduced sodium, it will have 300 mg of sodium. WHAT FOODS CAN I EAT? Grains Low-sodium cereals, including oats, puffed wheat and rice, and shredded wheat cereals. Low-sodium crackers. Unsalted rice and pasta. Lower-sodium bread.  Vegetables Frozen or fresh vegetables. Low-sodium or reduced-sodium canned vegetables. Low-sodium or reduced-sodium tomato sauce and paste. Low-sodium or reduced-sodium tomato and vegetable juices.  Fruits Fresh, frozen, and canned fruit. Fruit juice.  Meat and Other Protein Products Low-sodium canned tuna and salmon. Fresh or frozen meat, poultry, seafood, and fish. Lamb. Unsalted nuts. Dried beans, peas, and lentils without added salt. Unsalted canned beans. Homemade soups without salt. Eggs.  Dairy Milk. Soy milk. Ricotta cheese. Low-sodium or  reduced-sodium cheeses. Yogurt.  Condiments Fresh and dried herbs and spices. Salt-free seasonings. Onion and garlic powders. Low-sodium varieties of mustard and ketchup. Lemon juice.  Fats and Oils Reduced-sodium salad dressings. Unsalted butter.  Other Unsalted popcorn and pretzels.  The items listed above may not be a complete list of recommended foods or beverages. Contact your dietitian for more options. WHAT FOODS ARE NOT RECOMMENDED? Grains Instant hot cereals. Bread stuffing, pancake, and biscuit mixes. Croutons. Seasoned rice or pasta mixes. Noodle soup cups. Boxed or frozen macaroni and cheese. Self-rising flour. Regular salted crackers. Vegetables Regular canned vegetables. Regular canned tomato sauce and paste. Regular tomato and vegetable juices. Frozen vegetables in sauces. Salted french fries. Olives. Angie Fava. Relishes. Sauerkraut. Salsa. Meat and Other Protein Products Salted, canned, smoked, spiced, or pickled meats, seafood, or fish. Bacon, ham, sausage, hot dogs, corned beef, chipped beef, and packaged luncheon meats. Salt pork. Jerky. Pickled herring. Anchovies, regular canned tuna, and sardines. Salted nuts. Dairy Processed cheese and cheese spreads. Cheese curds. Blue cheese and cottage cheese. Buttermilk.  Condiments Onion and garlic salt, seasoned salt, table salt, and sea salt. Canned and packaged gravies. Worcestershire sauce. Tartar sauce. Barbecue sauce. Teriyaki sauce. Soy sauce, including reduced sodium. Steak sauce. Fish sauce. Oyster sauce. Cocktail sauce. Horseradish. Regular ketchup and mustard. Meat flavorings and tenderizers. Bouillon cubes. Hot sauce. Tabasco sauce. Marinades. Taco seasonings. Relishes. Fats and Oils Regular salad dressings. Salted butter. Margarine. Ghee. Bacon fat.  Other Potato and tortilla chips. Corn chips and puffs. Salted popcorn and pretzels. Canned or dried soups. Pizza. Frozen entrees and pot pies.  The items listed  above may not be a complete list of foods and beverages to avoid. Contact your dietitian for more information. Document Released: 04/27/2002 Document Revised: 11/10/2013 Document Reviewed: 09/09/2013 [facility] Patient Information 2015 [place], [place]. This information is not intended to replace advice given to you by your health care Eriyonna Matsushita. Make sure you discuss any questions you have with your health care Afiya Ferrebee.

## 2015-08-08 NOTE — Progress Notes (Signed)
Primary Care Physician:  Arnoldo Morale, MD Primary Gastroenterologist:  Dr. Oneida Alar   Chief Complaint  Patient presents with  . Cirrhosis    HPI:   Kevin Nicholson is a 54 y.o. male presenting today at the request of his primary care doctor due to cirrhosis.   Paracentesis June (6 liters), Aug 2 (8.4 liters), Aug 19th (4 liters). CT May 2016 with stigmata of cirrhosis and portal venous hypertension noting borderline splenomegaly, moderate volume ascites, no discrete hepatic lesions, colonic diverticulosis. Negative viral markers May 2016. Fluid analysis June 2016 at time of original paracentesis negative for SBP. Quit drinking in March 2016. Sober since that time.   Has intermittent abdominal pain, "stabs" in him "all the time". Uncomfortable. On lactulose through PCP. BM daily. No hematochezia or melena. States he lost weight "like crazy" when all this started happening. No N/V. Some food gives indigestion. No confusion or mental status changes but stays fatigued all the time. Still has lower extremity edema. On lasix 40 mg daily and aldactone 50 mg daily. Tries not to eat extra salt but not sure if he is following it right. Eats a can of sardines daily along with saltine crackers. Feels like abdomen is tight now but "not as bad as it has been". No Hep A or B vaccinations. No prior upper endoscopy or colonoscopy. Not much of an appetite. About a year ago weighed around 270.   Past Medical History  Diagnosis Date  . Ascites   . Cirrhosis     Past Surgical History  Procedure Laterality Date  . Back surgery  2000  . 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 furosemide (LASIX) 40 MG tablet Take 1 tablet (40 mg total) by mouth daily. 30 tablet 1  . lactulose (CHRONULAC) 10 GM/15ML solution Take 15 mLs (10 g total) by mouth 2 (two) times daily as needed for mild constipation. 946  mL 1  . spironolactone (ALDACTONE) 50 MG tablet Take 1 tablet (50 mg total) by mouth daily. 30 tablet 1   No current facility-administered medications for this visit.    Allergies as of 08/08/2015  . (No Known Allergies)    Family History  Problem Relation Age of Onset  . Cancer Mother   . Cancer Father   . Colon cancer Neg Hx   . Liver disease Neg Hx     Social History   Social History  . Marital Status: Single    Spouse Name: N/A  . Number of Children: N/A  . Years of Education: N/A   Occupational History  . Not on file.   Social History Main Topics  . Smoking status: Current Every Day Smoker -- 1.00 packs/day  . Smokeless tobacco: Not on file  . 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 Drug Use: No  . Sexual Activity: Not on file   Other Topics Concern  . Not on file   Social History Narrative    Review of Systems: Gen: see HPI CV: occasional chest discomfort Resp: +DOE GI: see HPI  GU : Denies urinary burning, urinary frequency, urinary hesitancy  MS: muscle wasting  Derm: Denies rash, itching, dry skin Psych: Denies depression, anxiety, memory loss, and confusion Heme: Denies bruising, bleeding, and enlarged lymph nodes.  Physical Exam: BP 116/69 mmHg  Pulse 92  Temp(Src) 98.1 F (36.7 C) (Oral)  Ht 5\' 11"  (1.803 m)  Wt 216 lb 12.8 oz (98.34 kg)  BMI 30.25 kg/m2 General:   Alert and oriented. Pleasant and cooperative. Appears chronically ill.  Head:  Normocephalic and atraumatic. Eyes:  Without icterus, sclera clear and conjunctiva pink.  Ears:  Normal auditory acuity. Nose:  No deformity, discharge,  or lesions. Mouth:  No deformity or lesions, oral mucosa pink.  Lungs:  Clear to auscultation bilaterally. No wheezes, rales, or rhonchi. No distress.  Heart:  S1, S2 present without murmurs appreciated.  Abdomen:  +BS, soft, distended, +ascites. Difficult to appreciate HSM due to moderately tense  abdomen Rectal:  Deferred  Msk:  Symmetrical without gross deformities. Normal posture. Extremities:  With 1-2 + pitting lower extremity edema. Muscle wasting bilateral upper extremities  Neurologic:  Alert and  oriented x4;  grossly normal neurologically. Psych:  Alert and cooperative. Normal mood and affect.   Lab Results  Component Value Date   WBC 7.9 06/21/2015   HGB 14.0 06/21/2015   HCT 40.4 06/21/2015   MCV 97.1 06/21/2015   PLT 198 06/21/2015   Lab Results  Component Value Date   ALT 20 06/21/2015   AST 37 06/21/2015   ALKPHOS 113 06/21/2015   BILITOT 2.4* 06/21/2015   Lab Results  Component Value Date   CREATININE 0.77 06/21/2015   BUN 12 06/21/2015   NA 133* 06/21/2015   K 4.0 06/21/2015   CL 101 06/21/2015   CO2 24 06/21/2015

## 2015-08-09 ENCOUNTER — Encounter: Payer: Self-pay | Admitting: Family Medicine

## 2015-08-09 ENCOUNTER — Ambulatory Visit: Payer: Medicaid Other | Attending: Family Medicine | Admitting: Family Medicine

## 2015-08-09 VITALS — BP 109/71 | HR 98 | Temp 98.7°F | Resp 16 | Ht 71.0 in | Wt 215.0 lb

## 2015-08-09 DIAGNOSIS — R188 Other ascites: Secondary | ICD-10-CM | POA: Insufficient documentation

## 2015-08-09 DIAGNOSIS — R0602 Shortness of breath: Secondary | ICD-10-CM | POA: Insufficient documentation

## 2015-08-09 DIAGNOSIS — Z23 Encounter for immunization: Secondary | ICD-10-CM | POA: Diagnosis not present

## 2015-08-09 DIAGNOSIS — F17200 Nicotine dependence, unspecified, uncomplicated: Secondary | ICD-10-CM | POA: Insufficient documentation

## 2015-08-09 DIAGNOSIS — J3489 Other specified disorders of nose and nasal sinuses: Secondary | ICD-10-CM | POA: Insufficient documentation

## 2015-08-09 DIAGNOSIS — K7031 Alcoholic cirrhosis of liver with ascites: Secondary | ICD-10-CM | POA: Diagnosis not present

## 2015-08-09 MED ORDER — SPIRONOLACTONE 50 MG PO TABS: 50.0000 mg | ORAL_TABLET | Freq: Every day | ORAL | Status: DC

## 2015-08-09 MED ORDER — FLUTICASONE PROPIONATE 50 MCG/ACT NA SUSP: 2.0000 | Freq: Every day | NASAL | Status: DC

## 2015-08-09 MED ORDER — ALBUTEROL SULFATE HFA 108 (90 BASE) MCG/ACT IN AERS: 2.0000 | INHALATION_SPRAY | Freq: Four times a day (QID) | RESPIRATORY_TRACT | Status: DC | PRN

## 2015-08-09 MED ORDER — LACTULOSE 10 GM/15ML PO SOLN: 10.0000 g | Freq: Two times a day (BID) | ORAL | Status: DC | PRN

## 2015-08-09 MED ORDER — ACETAMINOPHEN 500 MG PO TABS: 500.0000 mg | ORAL_TABLET | Freq: Three times a day (TID) | ORAL | Status: DC | PRN

## 2015-08-09 MED ORDER — FUROSEMIDE 40 MG PO TABS: 40.0000 mg | ORAL_TABLET | Freq: Every day | ORAL | Status: DC

## 2015-08-09 NOTE — Progress Notes (Signed)
Patient ID: Kevin Nicholson, male   DOB: [birth year], 54 y.o.   MRN: [id number]   Subjective:  Patient ID: Kevin Nicholson, male    DOB: 18-May-1961  Age: 54 y.o. MRN: [id number]  CC: Establish Care and Cirrhosis   HPI KAYDEN HUTMACHER presents for cirrhosis. He is followed by GI. He saw his GI provider yesterday. He has ascites. He has a paracentesis scheduled for 3 days from now. He has been advised to have Hep A and B vaccine.   Smoking: he continues to smoke since age 64. He would like to quit. He is not ready to quit now.   SOB: with dry cough. Sob with moderate exertion. No CP. Does not use an inhaler.   sinus pressure in the evenings: for 2 + months. No fever, chill, vision changes or nausea. Would like tylenol or aleve for pain. Wants to know which is safe.   Outpatient Prescriptions Prior to Visit  Medication Sig Dispense Refill  . furosemide (LASIX) 40 MG tablet Take 1 tablet (40 mg total) by mouth daily. 30 tablet 1  . lactulose (CHRONULAC) 10 GM/15ML solution Take 15 mLs (10 g total) by mouth 2 (two) times daily as needed for mild constipation. 946 mL 1  . spironolactone (ALDACTONE) 50 MG tablet Take 1 tablet (50 mg total) by mouth daily. 30 tablet 1   No facility-administered medications prior to visit.   Social History  Substance Use Topics  . Smoking status: Current Every Day Smoker -- 1.00 packs/day  . Smokeless tobacco: Not on file  . 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  HENT: Positive for sinus pressure.   Eyes: Negative for visual disturbance.  Respiratory: Positive for cough. Negative for shortness of breath.   Cardiovascular: Negative for chest pain, palpitations and leg swelling.  Gastrointestinal: Positive for abdominal distention. Negative for nausea, vomiting, abdominal pain, diarrhea, constipation and blood in  stool.  Endocrine: Negative for polydipsia, polyphagia and polyuria.  Musculoskeletal: Negative for myalgias, back pain, arthralgias, gait problem and neck pain.  Skin: Negative for rash.  Allergic/Immunologic: Negative for immunocompromised state.  Hematological: Negative for adenopathy. Bruises/bleeds easily (easy bruising. no bleeding ).  Psychiatric/Behavioral: Negative for suicidal ideas, sleep disturbance and dysphoric mood. The patient is not nervous/anxious.     Objective:  BP 109/71 mmHg  Pulse 98  Temp(Src) 98.7 F (37.1 C) (Oral)  Resp 16  Ht 5\' 11"  (1.803 m)  Wt 215 lb (97.523 kg)  BMI 30.00 kg/m2  SpO2 100%  BP/Weight 08/09/2015 08/08/2015 2/84/1324  Systolic BP 401 027 253  Diastolic BP 71 69 72  Wt. (Lbs) 215 216.8 212.6  BMI 30 30.25 29.66   Physical Exam  Constitutional: He appears well-developed and well-nourished. No distress.  HENT:  Nose: Mucosal edema present.  Neck: Normal range of motion. Neck supple.  Cardiovascular: Normal rate, regular rhythm, normal heart sounds and intact distal pulses.   Pulmonary/Chest: Effort normal and breath sounds normal.  Abdominal: He exhibits distension. There is no tenderness.  Round and distended abdomen   Musculoskeletal: He exhibits edema. He exhibits no tenderness.  Neurological: He is alert.  Skin: Skin is warm and dry. No rash noted. No erythema.  Psychiatric: He has a normal mood and affect.     Assessment & Plan:   Problem List Items Addressed This Visit  Ascites - Primary (Chronic)   Relevant Medications   furosemide (LASIX) 40 MG tablet   lactulose (CHRONULAC) 10 GM/15ML solution   spironolactone (ALDACTONE) 50 MG tablet   Hepatic cirrhosis (Chronic)   Relevant Medications   furosemide (LASIX) 40 MG tablet   lactulose (CHRONULAC) 10 GM/15ML solution   spironolactone (ALDACTONE) 50 MG tablet   Sinus pressure   Relevant Medications   fluticasone (FLONASE) 50 MCG/ACT nasal spray   acetaminophen  (TYLENOL) 500 MG tablet   SOB (shortness of breath)   Relevant Medications   albuterol (PROVENTIL HFA;VENTOLIN HFA) 108 (90 BASE) MCG/ACT inhaler   Vaccine for viral hepatitis    In need of vaccine. Hep B in stock Hep A ordered and will arrive shortly He will be contacted when hep A and stock and the series will be started Plan Hep Aseries of 2 at 0 and 6 months Plan Hep B series of 3 at 0, 1 and 6 months          No orders of the defined types were placed in this encounter.    Follow-up: Return in about 4 weeks (around 09/06/2015).   Kevin Nicholson

## 2015-08-09 NOTE — Assessment & Plan Note (Signed)
In need of vaccine. Hep B in stock Hep A ordered and will arrive shortly He will be contacted when hep A and stock and the series will be started Plan Hep Aseries of 2 at 0 and 6 months Plan Hep B series of 3 at 0, 1 and 6 months

## 2015-08-09 NOTE — Progress Notes (Signed)
Establish care with PCP Hx cirrhosis of the liver  Hx tobacco 1ppday

## 2015-08-09 NOTE — Patient Instructions (Addendum)
Mr. Kevin Nicholson, Kevin Nicholson was seen today for establish care and cirrhosis.  Diagnoses and all orders for this visit:  Ascites -     furosemide (LASIX) 40 MG tablet; Take 1 tablet (40 mg total) by mouth daily. -     lactulose (CHRONULAC) 10 GM/15ML solution; Take 15 mLs (10 g total) by mouth 2 (two) times daily as needed for mild constipation. -     spironolactone (ALDACTONE) 50 MG tablet; Take 1 tablet (50 mg total) by mouth daily.  Alcoholic cirrhosis of liver with ascites -     furosemide (LASIX) 40 MG tablet; Take 1 tablet (40 mg total) by mouth daily. -     lactulose (CHRONULAC) 10 GM/15ML solution; Take 15 mLs (10 g total) by mouth 2 (two) times daily as needed for mild constipation. -     spironolactone (ALDACTONE) 50 MG tablet; Take 1 tablet (50 mg total) by mouth daily.  Sinus pressure -     fluticasone (FLONASE) 50 MCG/ACT nasal spray; Place 2 sprays into both nostrils daily. -     acetaminophen (TYLENOL) 500 MG tablet; Take 1-2 tablets (500-1,000 mg total) by mouth every 8 (eight) hours as needed. Do not exceed 2000 mg in 24 hr period  SOB (shortness of breath) -     albuterol (PROVENTIL HFA;VENTOLIN HFA) 108 (90 BASE) MCG/ACT inhaler; Inhale 2 puffs into the lungs every 6 (six) hours as needed for wheezing or shortness of breath.  Other orders -     Flu Vaccine QUAD 36+ mos IM -     Cancel: Flu Vaccine QUAD 36+ mos IM   You will be called with Hep A and B vaccines have arrived   F/u with me in 4 weeks for cirrhosis   Dr. Adrian Blackwater  Dr. Adrian Blackwater

## 2015-08-09 NOTE — Assessment & Plan Note (Signed)
54 year old with likely ETOH cirrhosis, presenting with prior multiple LVAPs likely multifactorial and with need for stricter dietary regimen and possible increase in diuretics. I would like to check a repeat BMP and increase aldactone to 100 mg and keep lasix at 40 mg. Will order renal function first. Discussed avoidance of added salt, following a strict 2 gram sodium diet. Needs Hep A/B vaccination. Moderately tense abdomen at time of appt; will also arrange LVAP with 25 g IV albumin at 4 liters. No prior colonoscopy and declining this today despite discussion of risks/benefits. Willing to proceed with EGD for variceal screening. Does not want to be considered for transplant evaluation. Will follow closely. Applauded on ETOH abstinence.   Proceed with upper endoscopy in the near future with Dr. Oneida Alar. The risks, benefits, and alternatives have been discussed in detail with patient. They have stated understanding and desire to proceed.  PROPOFOL due to hx of ETOH use [place] para with 25 g IV albumin at 4 liters Labs to include autoimmune/PBC, INR, AFP [place] every 6 months Hep A/B vaccination 6-8 week return

## 2015-08-10 ENCOUNTER — Telehealth: Payer: Self-pay | Admitting: Family Medicine

## 2015-08-10 NOTE — Telephone Encounter (Signed)
Please call patient Kevin Nicholson now available Please give the following:  Kevin A series of 2 at 0 and 6 months Kevin Nicholson series of 3 at 0, 1 and 6 months

## 2015-08-10 NOTE — Progress Notes (Signed)
CC'D TO PCP °

## 2015-08-10 NOTE — Telephone Encounter (Signed)
Pt notified INJ here  transfer call to front office to schedule appointment

## 2015-08-12 ENCOUNTER — Encounter (HOSPITAL_COMMUNITY)
Admission: RE | Admit: 2015-08-12 | Discharge: 2015-08-12 | Disposition: A | Discharge status: Home / Self Care | Payer: Self-pay | Source: Ambulatory Visit | Attending: Gastroenterology | Admitting: Gastroenterology

## 2015-08-12 ENCOUNTER — Ambulatory Visit (HOSPITAL_COMMUNITY)
Admission: RE | Admit: 2015-08-12 | Discharge: 2015-08-12 | Disposition: A | Discharge status: Home / Self Care | Payer: Medicaid Other | Source: Ambulatory Visit | Attending: Gastroenterology | Admitting: Gastroenterology

## 2015-08-12 DIAGNOSIS — R188 Other ascites: Secondary | ICD-10-CM | POA: Diagnosis not present

## 2015-08-12 DIAGNOSIS — K7031 Alcoholic cirrhosis of liver with ascites: Secondary | ICD-10-CM | POA: Diagnosis not present

## 2015-08-12 MED ORDER — SODIUM CHLORIDE 0.9 % IV SOLN
Freq: Once | INTRAVENOUS | Status: AC
  Administered 2015-08-12: 250 mL via INTRAVENOUS

## 2015-08-12 MED ORDER — ALBUMIN HUMAN 25 % IV SOLN: 25.0000 g | Freq: Once | INTRAVENOUS | Status: DC

## 2015-08-12 MED ORDER — ALBUMIN HUMAN 25 % IV SOLN
50.0000 g | Freq: Once | INTRAVENOUS | Status: AC
  Administered 2015-08-12: 50 g via INTRAVENOUS
  Filled 2015-08-12: qty 200

## 2015-08-12 NOTE — Progress Notes (Signed)
Uneventful infusion of 50 Gm Albumin after paracentesis in Radiology. Pt states this is the first time he has received Albumin. Discharged via w/c accompanied by staff and friend to get car.

## 2015-08-12 NOTE — Procedures (Signed)
Successful US guided paracentesis from RLQ.  Yielded 10 liters of dark yellow fluid.  No immediate complications.  Pt tolerated well.   Specimen was not sent for labs.  WENDY S BLAIR PA-C 08/12/2015 12:03 PM

## 2015-08-12 NOTE — Discharge Instructions (Signed)
Albumin injection °What is this medicine? °ALBUMIN (al BYOO min) is used to treat or prevent shock following serious injury, bleeding, surgery, or burns by increasing the volume of blood plasma. This medicine can also replace low blood protein. °This medicine may be used for other purposes; ask your health care provider or pharmacist if you have questions. °COMMON BRAND NAME(S): Albuked, Albumarc, Albuminar, Albutein, Buminate, Flexbumin, Kedbumin, Macrotec, Plasbumin °What should I tell my health care provider before I take this medicine? °They need to know if you have any of the following conditions: °-anemia °-heart disease °-kidney disease °-an unusual or allergic reaction to albumin, other medicines, foods, dyes, or preservatives °-pregnant or trying to get pregnant °-breast-feeding °How should I use this medicine? °This medicine is for infusion into a vein. It is given by a health-care professional in a hospital or clinic. °Talk to your pediatrician regarding the use of this medicine in children. While this drug may be prescribed for selected conditions, precautions do apply. °Overdosage: If you think you have taken too much of this medicine contact a poison control center or emergency room at once. °NOTE: This medicine is only for you. Do not share this medicine with others. °What if I miss a dose? °This does not apply. °What may interact with this medicine? °Interactions are not expected. °This list may not describe all possible interactions. Give your health care provider a list of all the medicines, herbs, non-prescription drugs, or dietary supplements you use. Also tell them if you smoke, drink alcohol, or use illegal drugs. Some items may interact with your medicine. °What should I watch for while using this medicine? °Your condition will be closely monitored while you receive this medicine. °Some products are derived from human plasma, and there is a small risk that these products may contain certain  types of virus or bacteria. All products are processed to kill most viruses and bacteria. If you have questions concerning the risk of infections, discuss them with your doctor or health care professional. °What side effects may I notice from receiving this medicine? °Side effects that you should report to your doctor or health care professional as soon as possible: °-allergic reactions like skin rash, itching or hives, swelling of the face, lips, or tongue °-breathing problems °-changes in heartbeat °-fever, chills °-pain, redness or swelling at the injection site °-signs of viral infection including fever, drowsiness, chills, runny nose followed in about 2 weeks by a rash and joint pain °-tightness in the chest °Side effects that usually do not require medical attention (report to your doctor or health care professional if they continue or are bothersome): °-increased salivation °-nausea, vomiting °This list may not describe all possible side effects. Call your doctor for medical advice about side effects. You may report side effects to FDA at 1-800-FDA-1088. °Where should I keep my medicine? °This does not apply. You will not be given this medicine to store at home. °NOTE: This sheet is a summary. It may not cover all possible information. If you have questions about this medicine, talk to your doctor, pharmacist, or health care provider. °© 2015, Elsevier/Gold Standard. (2008-01-29 10:18:55) °Paracentesis °Paracentesis is a procedure used to remove excess fluid from the belly (abdomen). Excess fluid in the belly is called ascites. Excess fluid can be the result of certain conditions, such as infection, inflammation, abdominal injury, heart failure, chronic scarring of the liver (cirrhosis), or cancer. The excess fluid is removed using a needle inserted through the skin and tissue into the   abdomen.  °A paracentesis may be done to: °· Determine the cause of the excess fluid through examination of the  fluid. °· Relieve symptoms of shortness of breath or pain caused by the excess fluid. °· Determine presence of bleeding after an abdominal injury. °LET YOUR CAREGIVERS KNOW ABOUT: °· Allergies. °· Medications taken including herbs, eye drops, over-the-counter medications, and creams. °· Use of steroids (by mouth or creams). °· Previous problems with anesthetics or numbing medicine. °· Possibility of pregnancy, if this applies. °· History of blood clots (thrombophlebitis). °· History of bleeding or blood problems. °· Previous surgery. °· Other health problems. °RISKS AND COMPLICATIONS °· Injury to an abdominal organ, such as the bowel (large intestine), liver, spleen, or bladder. °· Possible infection. °· Bleeding. °· Low blood pressure (hypotension). °BEFORE THE PROCEDURE °This is a procedure that can be done as an outpatient. Confirm the time that you need to arrive for your procedure. A blood sample may be done to determine your blood clotting time. The presence of a severe bleeding disorder (coagulopathy) which cannot be promptly corrected may make this procedure inadvisable. You may be asked to urinate. °PROCEDURE °The procedure will take about 30 minutes. This time will vary depending on the amount of fluid that is removed. You may be asked to lie on your back with your head elevated. An area on your abdomen will be cleansed. A numbing medicine may then be injected (local anesthesia) into the skin and tissue. A needle is inserted through your abdominal skin and tissues until it is positioned in your abdomen. You may feel pressure or slight pain as the needle is positioned into the abdomen. Fluid is removed from the abdomen through the needle. Tell your caregiver if you feel dizzy or lightheaded. The needle is withdrawn once the desired amount of fluid has been removed. A sample of the fluid may be sent for examination.  °AFTER THE PROCEDURE °Your recovery will be assessed and monitored. If there are no  problems, as an outpatient, you should be able to go home shortly after the procedure. There may be a very limited amount of clear fluid draining from the needle insertion site over the next 2 days. Confirm with your caregiver as to the expected amount of drainage. °Obtaining the Test Results °It is your responsibility to obtain your test results. Do not assume everything is normal if you have not heard from your caregiver or the medical facility. It is important for you to follow up on all of your test results. °HOME CARE INSTRUCTIONS  °· You may resume normal diet and activities as directed or allowed. °· Only take over-the-counter or prescription medicines for pain, discomfort, or fever as directed by your caregiver. °SEEK IMMEDIATE MEDICAL CARE IF: °· You develop shortness of breath or chest pain. °· You develop increasing pain, discomfort, or swelling in your abdomen. °· You develop new drainage or pus coming from site where fluid was removed. °· You develop swelling or increased redness from site where fluid was removed. °· You develop an unexplained temperature of 102° F (38.9° C) or above. °Document Released: 05/21/2005 Document Revised: 01/28/2012 Document Reviewed: 06/27/2009 °ExitCare® Patient Information ©2015 ExitCare, [employer]. This information is not intended to replace advice given to you by your health care provider. Make sure you discuss any questions you have with your health care provider. ° ° °

## 2015-08-15 ENCOUNTER — Ambulatory Visit: Payer: Self-pay

## 2015-08-16 NOTE — Progress Notes (Signed)
Quick Note:  LMOM to call. ______ 

## 2015-08-16 NOTE — Progress Notes (Signed)
Quick Note:  10 liters of fluid removed! Needs labs done that were ordered at last visit. ______

## 2015-08-17 NOTE — Progress Notes (Signed)
Quick Note:  Pt is aware. He will try to do the labs as soon as he can, he has something like the flu now. ______

## 2015-08-23 ENCOUNTER — Ambulatory Visit: Payer: Medicaid Other | Attending: Internal Medicine

## 2015-08-23 ENCOUNTER — Encounter: Payer: Self-pay | Admitting: Pharmacist

## 2015-08-23 VITALS — BP 101/68 | HR 86 | Temp 97.9°F | Resp 18 | Ht 71.0 in | Wt 191.0 lb

## 2015-08-23 DIAGNOSIS — R05 Cough: Secondary | ICD-10-CM | POA: Insufficient documentation

## 2015-08-23 DIAGNOSIS — R059 Cough, unspecified: Secondary | ICD-10-CM

## 2015-08-23 DIAGNOSIS — Z23 Encounter for immunization: Secondary | ICD-10-CM | POA: Diagnosis not present

## 2015-08-23 MED ORDER — CETIRIZINE HCL 10 MG PO TABS: 10.0000 mg | ORAL_TABLET | Freq: Every day | ORAL | Status: DC

## 2015-08-23 NOTE — Progress Notes (Signed)
Patient reports being here for Hep A and Hep B immunizations today. This is the first in the series of two for Hep A and the first in the series of three for Hep B.   Patient complains of cough for 2 weeks, patient suspects seasonal allergy issue. Dr. Adrian Blackwater aware of cough and advised nurse to send zyrtec 10mg  for patient to take once daily and to proceed with both injections.

## 2015-08-23 NOTE — Patient Instructions (Addendum)
Please return 09/06/15 for follow up appointment with Dr. Adrian Blackwater.  Please return in 1 month for 2nd Hep B. Please return in 6 months for 2nd Hep A and 3rd Hep B injection.

## 2015-09-08 NOTE — Patient Instructions (Signed)
Kevin Nicholson  09/08/2015     @PREFPERIOPPHARMACY @   Your procedure is scheduled on  09/13/2015   Report to [name] at  1045  A.M.  Call this number if you have problems the morning of surgery:  [phone]   Remember:  Do not eat food or drink liquids after midnight.  Take these medicines the morning of surgery with A SIP OF WATER  Zyrtec. Take your inhaler before you come and bring it with you.   Do not wear jewelry, make-up or nail polish.  Do not wear lotions, powders, or perfumes.  You may wear deodorant.  Do not shave 48 hours prior to surgery.  Men may shave face and neck.  Do not bring valuables to the hospital.  [facility] is not responsible for any belongings or valuables.  Contacts, dentures or bridgework may not be worn into surgery.  Leave your suitcase in the car.  After surgery it may be brought to your room.  For patients admitted to the hospital, discharge time will be determined by your treatment team.  Patients discharged the day of surgery will not be allowed to drive home.   Name and phone number of your driver:   family Special instructions:  Follow the diet instructions given to you by Dr Oneida Alar office.  Please read over the following fact sheets that you were given. Pain Booklet, Coughing and Deep Breathing, Surgical Site Infection Prevention, Anesthesia Post-op Instructions and Care and Recovery After Surgery      Esophagogastroduodenoscopy Esophagogastroduodenoscopy (EGD) is a procedure that is used to examine the lining of the esophagus, stomach, and first part of the small intestine (duodenum). A long, flexible, lighted tube with a camera attached (endoscope) is inserted down the throat to view these organs. This procedure is done to detect problems or abnormalities, such as inflammation, bleeding, ulcers, or growths, in order to treat them. The procedure lasts 5-20 minutes. It is usually an outpatient procedure, but it may need to  be performed in a hospital in emergency cases. LET [facility] CARE PROVIDER KNOW ABOUT:  Any allergies you have.  All medicines you are taking, including vitamins, herbs, eye drops, creams, and over-the-counter medicines.  Previous problems you or members of your family have had with the use of anesthetics.  Any blood disorders you have.  Previous surgeries you have had.  Medical conditions you have. RISKS AND COMPLICATIONS Generally, this is a safe procedure. However, problems can occur and include:  Infection.  Bleeding.  Tearing (perforation) of the esophagus, stomach, or duodenum.  Difficulty breathing or not being able to breathe.  Excessive sweating.  Spasms of the larynx.  Slowed heartbeat.  Low blood pressure. BEFORE THE PROCEDURE  Do not eat or drink anything after midnight on the night before the procedure or as directed by your health care provider.  Do not take your regular medicines before the procedure if your health care provider asks you not to. Ask your health care provider about changing or stopping those medicines.  If you wear dentures, be prepared to remove them before the procedure.  Arrange for someone to drive you home after the procedure. PROCEDURE  A numbing medicine (local anesthetic) may be sprayed in your throat for comfort and to stop you from gagging or coughing.  You will have an IV tube inserted in a vein in your hand or arm. You will receive medicines and fluids through this tube.  You will  be given a medicine to relax you (sedative).  A pain reliever will be given through the IV tube.  A mouth guard may be placed in your mouth to protect your teeth and to keep you from biting on the endoscope.  You will be asked to lie on your left side.  The endoscope will be inserted down your throat and into your esophagus, stomach, and duodenum.  Air will be put through the endoscope to allow your health care provider to clearly view the  lining of your esophagus.  The lining of your esophagus, stomach, and duodenum will be examined. During the exam, your health care provider may:  Remove tissue to be examined under a microscope (biopsy) for inflammation, infection, or other medical problems.  Remove growths.  Remove objects (foreign bodies) that are stuck.  Treat any bleeding with medicines or other devices that stop tissues from bleeding (hot cautery, clipping devices).  Widen (dilate) or stretch narrowed areas of your esophagus and stomach.  The endoscope will be withdrawn. AFTER THE PROCEDURE  You will be taken to a recovery area for observation. Your blood pressure, heart rate, breathing rate, and blood oxygen level will be monitored often until the medicines you were given have worn off.  Do not eat or drink anything until the numbing medicine has worn off and your gag reflex has returned. You may choke.  Your health care provider should be able to discuss his or her findings with you. It will take longer to discuss the test results if any biopsies were taken.   This information is not intended to replace advice given to you by your health care provider. Make sure you discuss any questions you have with your health care provider.   Document Released: 03/08/2005 Document Revised: 11/26/2014 Document Reviewed: 10/08/2012 Elsevier Interactive Patient Education 2016 Elsevier Inc. PATIENT INSTRUCTIONS POST-ANESTHESIA  IMMEDIATELY FOLLOWING SURGERY:  Do not drive or operate machinery for the first twenty four hours after surgery.  Do not make any important decisions for twenty four hours after surgery or while taking narcotic pain medications or sedatives.  If you develop intractable nausea and vomiting or a severe headache please notify your doctor immediately.  FOLLOW-UP:  Please make an appointment with your surgeon as instructed. You do not need to follow up with anesthesia unless specifically instructed to do  so.  WOUND CARE INSTRUCTIONS (if applicable):  Keep a dry clean dressing on the anesthesia/puncture wound site if there is drainage.  Once the wound has quit draining you may leave it open to air.  Generally you should leave the bandage intact for twenty four hours unless there is drainage.  If the epidural site drains for more than 36-48 hours please call the anesthesia department.  QUESTIONS?:  Please feel free to call your physician or the hospital operator if you have any questions, and they will be happy to assist you.

## 2015-09-09 ENCOUNTER — Other Ambulatory Visit: Payer: Self-pay

## 2015-09-09 ENCOUNTER — Encounter (HOSPITAL_COMMUNITY)
Admission: RE | Admit: 2015-09-09 | Discharge: 2015-09-09 | Disposition: A | Discharge status: Home / Self Care | Payer: Medicaid Other | Source: Ambulatory Visit | Attending: Gastroenterology | Admitting: Gastroenterology

## 2015-09-09 ENCOUNTER — Encounter (HOSPITAL_COMMUNITY): Payer: Self-pay

## 2015-09-09 DIAGNOSIS — K7460 Unspecified cirrhosis of liver: Secondary | ICD-10-CM | POA: Diagnosis not present

## 2015-09-09 DIAGNOSIS — Z01818 Encounter for other preprocedural examination: Secondary | ICD-10-CM | POA: Insufficient documentation

## 2015-09-09 LAB — BASIC METABOLIC PANEL
ANION GAP: 8 (ref 5–15)
BUN: 18 mg/dL (ref 6–20)
CALCIUM: 9 mg/dL (ref 8.9–10.3)
CO2: 27 mmol/L (ref 22–32)
Chloride: 98 mmol/L — ABNORMAL LOW (ref 101–111)
Creatinine, Ser: 0.8 mg/dL (ref 0.61–1.24)
Glucose, Bld: 124 mg/dL — ABNORMAL HIGH (ref 65–99)
POTASSIUM: 4.8 mmol/L (ref 3.5–5.1)
Sodium: 133 mmol/L — ABNORMAL LOW (ref 135–145)

## 2015-09-09 LAB — CBC WITH DIFFERENTIAL/PLATELET
BASOS ABS: 0.1 10*3/uL (ref 0.0–0.1)
BASOS PCT: 1 %
Eosinophils Absolute: 0.2 10*3/uL (ref 0.0–0.7)
Eosinophils Relative: 3 %
HEMATOCRIT: 40.2 % (ref 39.0–52.0)
Hemoglobin: 13.9 g/dL (ref 13.0–17.0)
LYMPHS PCT: 22 %
Lymphs Abs: 1.4 10*3/uL (ref 0.7–4.0)
MCH: 34.3 pg — ABNORMAL HIGH (ref 26.0–34.0)
MCHC: 34.6 g/dL (ref 30.0–36.0)
MCV: 99.3 fL (ref 78.0–100.0)
MONO ABS: 0.6 10*3/uL (ref 0.1–1.0)
Monocytes Relative: 11 %
NEUTROS ABS: 3.9 10*3/uL (ref 1.7–7.7)
Neutrophils Relative %: 63 %
PLATELETS: 174 10*3/uL (ref 150–400)
RBC: 4.05 MIL/uL — AB (ref 4.22–5.81)
RDW: 13.9 % (ref 11.5–15.5)
WBC: 6.1 10*3/uL (ref 4.0–10.5)

## 2015-09-13 ENCOUNTER — Ambulatory Visit (HOSPITAL_COMMUNITY): Payer: Medicaid Other | Admitting: Anesthesiology

## 2015-09-13 ENCOUNTER — Encounter (HOSPITAL_COMMUNITY)
Admission: RE | Disposition: A | Discharge status: Home / Self Care | Payer: Self-pay | Source: Ambulatory Visit | Attending: Gastroenterology

## 2015-09-13 ENCOUNTER — Telehealth: Payer: Self-pay | Admitting: Gastroenterology

## 2015-09-13 ENCOUNTER — Encounter (HOSPITAL_COMMUNITY): Payer: Self-pay | Admitting: *Deleted

## 2015-09-13 ENCOUNTER — Ambulatory Visit (HOSPITAL_COMMUNITY)
Admission: RE | Admit: 2015-09-13 | Discharge: 2015-09-13 | Disposition: A | Discharge status: Home / Self Care | Payer: Medicaid Other | Source: Ambulatory Visit | Attending: Gastroenterology | Admitting: Gastroenterology

## 2015-09-13 ENCOUNTER — Other Ambulatory Visit: Payer: Self-pay

## 2015-09-13 DIAGNOSIS — K259 Gastric ulcer, unspecified as acute or chronic, without hemorrhage or perforation: Secondary | ICD-10-CM

## 2015-09-13 DIAGNOSIS — Z1389 Encounter for screening for other disorder: Secondary | ICD-10-CM | POA: Diagnosis present

## 2015-09-13 DIAGNOSIS — Z79899 Other long term (current) drug therapy: Secondary | ICD-10-CM | POA: Diagnosis not present

## 2015-09-13 DIAGNOSIS — F17210 Nicotine dependence, cigarettes, uncomplicated: Secondary | ICD-10-CM | POA: Insufficient documentation

## 2015-09-13 DIAGNOSIS — B9681 Helicobacter pylori [H. pylori] as the cause of diseases classified elsewhere: Secondary | ICD-10-CM | POA: Diagnosis not present

## 2015-09-13 DIAGNOSIS — R188 Other ascites: Secondary | ICD-10-CM

## 2015-09-13 DIAGNOSIS — I8500 Esophageal varices without bleeding: Secondary | ICD-10-CM | POA: Insufficient documentation

## 2015-09-13 DIAGNOSIS — K7031 Alcoholic cirrhosis of liver with ascites: Secondary | ICD-10-CM

## 2015-09-13 DIAGNOSIS — K2950 Unspecified chronic gastritis without bleeding: Secondary | ICD-10-CM | POA: Diagnosis not present

## 2015-09-13 DIAGNOSIS — K7460 Unspecified cirrhosis of liver: Secondary | ICD-10-CM | POA: Insufficient documentation

## 2015-09-13 HISTORY — PX: ESOPHAGOGASTRODUODENOSCOPY (EGD) WITH PROPOFOL: SHX5813

## 2015-09-13 HISTORY — PX: BIOPSY: SHX5522

## 2015-09-13 SURGERY — ESOPHAGOGASTRODUODENOSCOPY (EGD) WITH PROPOFOL
Anesthesia: Monitor Anesthesia Care

## 2015-09-13 MED ORDER — FENTANYL CITRATE (PF) 100 MCG/2ML IJ SOLN
25.0000 ug | INTRAMUSCULAR | Status: AC
  Administered 2015-09-13 (×2): 25 ug via INTRAVENOUS
  Filled 2015-09-13: qty 2

## 2015-09-13 MED ORDER — LIDOCAINE HCL (PF) 1 % IJ SOLN
INTRAMUSCULAR | Status: AC
  Filled 2015-09-13: qty 5

## 2015-09-13 MED ORDER — LIDOCAINE VISCOUS 2 % MT SOLN
OROMUCOSAL | Status: AC
  Filled 2015-09-13: qty 15

## 2015-09-13 MED ORDER — FENTANYL CITRATE (PF) 100 MCG/2ML IJ SOLN: 25.0000 ug | INTRAMUSCULAR | Status: DC | PRN

## 2015-09-13 MED ORDER — FUROSEMIDE 40 MG PO TABS: ORAL_TABLET | ORAL | Status: DC

## 2015-09-13 MED ORDER — PROPOFOL 500 MG/50ML IV EMUL
INTRAVENOUS | Status: DC | PRN
  Administered 2015-09-13: 125 ug/kg/min via INTRAVENOUS

## 2015-09-13 MED ORDER — STERILE WATER FOR IRRIGATION IR SOLN
Status: DC | PRN
  Administered 2015-09-13: 1000 mL

## 2015-09-13 MED ORDER — ONDANSETRON HCL 4 MG/2ML IJ SOLN: 4.0000 mg | Freq: Once | INTRAMUSCULAR | Status: DC | PRN

## 2015-09-13 MED ORDER — ONDANSETRON HCL 4 MG/2ML IJ SOLN
4.0000 mg | Freq: Once | INTRAMUSCULAR | Status: AC
  Administered 2015-09-13: 4 mg via INTRAVENOUS
  Filled 2015-09-13: qty 2

## 2015-09-13 MED ORDER — LACTATED RINGERS IV SOLN
INTRAVENOUS | Status: DC
  Administered 2015-09-13: 11:00:00 via INTRAVENOUS

## 2015-09-13 MED ORDER — SPIRONOLACTONE 100 MG PO TABS: 100.0000 mg | ORAL_TABLET | Freq: Every day | ORAL | Status: DC

## 2015-09-13 MED ORDER — PROPOFOL 10 MG/ML IV BOLUS
INTRAVENOUS | Status: DC | PRN
  Administered 2015-09-13 (×2): 10 mg via INTRAVENOUS

## 2015-09-13 MED ORDER — OMEPRAZOLE 20 MG PO CPDR: DELAYED_RELEASE_CAPSULE | ORAL | Status: DC

## 2015-09-13 MED ORDER — MIDAZOLAM HCL 2 MG/2ML IJ SOLN
1.0000 mg | INTRAMUSCULAR | Status: DC | PRN
  Administered 2015-09-13: 2 mg via INTRAVENOUS
  Filled 2015-09-13: qty 2

## 2015-09-13 SURGICAL SUPPLY — 19 items
BLOCK BITE 60FR ADLT L/F BLUE (MISCELLANEOUS) ×2 IMPLANT
ELECT REM PT RETURN 9FT ADLT (ELECTROSURGICAL)
ELECTRODE REM PT RTRN 9FT ADLT (ELECTROSURGICAL) IMPLANT
FLOOR PAD 36X40 (MISCELLANEOUS) ×3
FORCEPS BIOP RAD 4 LRG CAP 4 (CUTTING FORCEPS) ×2 IMPLANT
FORMALIN 10 PREFIL 20ML (MISCELLANEOUS) ×2 IMPLANT
KIT ENDO PROCEDURE PEN (KITS) ×3 IMPLANT
MANIFOLD NEPTUNE II (INSTRUMENTS) ×3 IMPLANT
NDL SCLEROTHERAPY 25GX240 (NEEDLE) IMPLANT
NEEDLE SCLEROTHERAPY 25GX240 (NEEDLE) IMPLANT
PAD FLOOR 36X40 (MISCELLANEOUS) IMPLANT
PROBE APC STR FIRE (PROBE) IMPLANT
PROBE INJECTION GOLD (MISCELLANEOUS)
PROBE INJECTION GOLD 7FR (MISCELLANEOUS) IMPLANT
SNARE SHORT THROW 13M SML OVAL (MISCELLANEOUS) IMPLANT
SYR 50ML LL SCALE MARK (SYRINGE) ×2 IMPLANT
SYR INFLATION 60ML (SYRINGE) ×2 IMPLANT
TUBING IRRIGATION ENDOGATOR (MISCELLANEOUS) ×2 IMPLANT
WATER STERILE IRR 1000ML POUR (IV SOLUTION) ×2 IMPLANT

## 2015-09-13 NOTE — Op Note (Signed)
[facility] [address] [place], [address]   ENDOSCOPY PROCEDURE REPORT  PATIENT: Kevin Nicholson, Kevin Nicholson  MR#: [id number] BIRTHDATE: [birth year] , 89  yrs. old GENDER: male  ENDOSCOPIST: Danie Binder, MD REFERRED BY:  PROCEDURE DATE: Sep 27, 2015 PROCEDURE:   EGD w/ biopsy  INDICATIONS:screening for varices. MEDICATIONS: Monitored anesthesia care TOPICAL ANESTHETIC:   Viscous Xylocaine ASA CLASS:  DESCRIPTION OF PROCEDURE:     Physical exam was performed.  Informed consent was obtained from the patient after explaining the benefits, risks, and alternatives to the procedure.  The patient was connected to the monitor and placed in the left lateral position.  Continuous oxygen was provided by nasal cannula and IV medicine administered through an indwelling cannula.  After administration of sedation, the patients esophagus was intubated and the     endoscope was advanced under direct visualization to the second portion of the duodenum.  The scope was removed slowly by carefully examining the color, texture, anatomy, and integrity of the mucosa on the way out.  The patient was recovered in endoscopy and discharged home in satisfactory condition.  Estimated blood loss is zero unless otherwise noted in this procedure report.     ESOPHAGUS: 4 COLUMNS GRADE I VARICES.   STOMACH: Three small non-bleeding and clean-based ulcers were found in the gastric antrum.  Biopsies were taken around the ulcers.   DUODENUM: The duodenal mucosa showed no abnormalities in the bulb and 2nd part of the duodenum. COMPLICATIONS: There were no immediate complications.  ENDOSCOPIC IMPRESSION: 1.   4 COLUMNS GRADE I VARICES 2.   Three small ulcersin the gastric antrum  RECOMMENDATIONS: ALDACTONE 100 MG WITH LASIX 40 MG DAILY WATCH THE SALT IN YOUR DIET. OMEPRAZOLE 30 MINS PRIOR TO YOUR MEALS TWICE DAILY. AVOID ITEMS THAT CAUSE ULCERS. BMP NEXT TUES. AWAIT BIOPSY RESULTS. REPEAT  EGD IN 3 MOS TO REASSESS PUD. FOLLOW UP IN 6 MOS.  REPEAT EXAM: eSigned:  Danie Binder, MD 2015-09-27 4:34 PM  CPT CODES: ICD CODES:  The ICD and CPT codes recommended by this software are interpretations from the data that the clinical staff has captured with the software.  The verification of the translation of this report to the ICD and CPT codes and modifiers is the sole responsibility of the health care institution and practicing physician where this report was generated.  [employer] will not be held responsible for the validity of the ICD and CPT codes included on this report.  AMA assumes no liability for data contained or not contained herein. CPT is a Designer, television/film set of the [employer].

## 2015-09-13 NOTE — Discharge Instructions (Signed)
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 YOU HAVE GASTRITIS.   TO KEEP FLUID FROM ACCUMULATING IN YOUR ABDOMEN, TAKE ALDACTONE 100 MG WITH LASIX 40 MG DAILY AND WATCH THE SALT IN YOUR DIET.  TO TREAT YOUR ULCERS, START OMEPRAZOLE.  TAKE 30 MINUTES PRIOR TO YOUR MEALS TWICE DAILY.  AVOID ITEMS THAT CAUSE ULCERS. SEE INFO BELOW.  YOU NEED YOUR BLOOD DRAWN NEXT TUES TO CHECK YOUR KIDNEY FUNCTION AND POTASSIUM.  YOUR BIOPSY RESULTS WILL BE AVAILABLE IN MY CHART AFTER OCT 26 AND MY OFFICE WILL CONTACT YOU IN 10-14 DAYS WITH YOUR RESULTS.   REPEAT EGD IN 3 MOS.  FOLLOW UP IN 6 MOS.   UPPER ENDOSCOPY AFTER CARE Read the instructions outlined below and refer to this sheet in the next week. These discharge instructions provide you with general information on caring for yourself after you leave the hospital. While your treatment has been planned according to the most current medical practices available, unavoidable complications occasionally occur. If you have any problems or questions after discharge, call DR. Tammy Wickliffe, [phone].  ACTIVITY  You may resume your regular activity, but move at a slower pace for the next 24 hours.   Take frequent rest periods for the next 24 hours.   Walking will help get rid of the air and reduce the bloated feeling in your belly (abdomen).   No driving for 24 hours (because of the medicine (anesthesia) used during the test).   You may shower.   Do not sign any important legal documents or operate any machinery for 24 hours (because of the anesthesia used during the test).    NUTRITION  Drink plenty of fluids.   You may resume your normal diet as instructed by your doctor.   Begin with a light meal and progress to your normal diet. Heavy or fried foods are harder to digest and may make you feel sick to your stomach (nauseated).   Avoid alcoholic beverages for  24 hours or as instructed.    MEDICATIONS  You may resume your normal medications.   WHAT YOU CAN EXPECT TODAY  Some feelings of bloating in the abdomen.   Passage of more gas than usual.    IF YOU HAD A BIOPSY TAKEN DURING THE UPPER ENDOSCOPY:  Eat a soft diet IF YOU HAVE NAUSEA, BLOATING, ABDOMINAL PAIN, OR VOMITING.    FINDING OUT THE RESULTS OF YOUR TEST Not all test results are available during your visit. DR. Oneida Alar WILL CALL YOU WITHIN 7 DAYS OF YOUR PROCEDUE WITH YOUR RESULTS. Do not assume everything is normal if you have not heard from DR. Quenesha Douglass IN ONE WEEK, CALL HER OFFICE AT [phone].  SEEK IMMEDIATE MEDICAL ATTENTION AND CALL THE OFFICE: [phone] IF:  You have more than a spotting of blood in your stool.   Your belly is swollen (abdominal distention).   You are nauseated or vomiting.   You have a temperature over 101F.   You have abdominal pain or discomfort that is severe or gets worse throughout the day.   Ulcer Disease (Peptic Ulcer, Gastric Ulcer, Duodenal Ulcer) You have an ulcer. This may be in your stomach (gastric ulcer) or in the first part of your small bowel, the duodenum (duodenal ulcer). An ulcer is a break in the lining of the stomach or duodenum. The ulcer causes erosion into the deeper tissue. CAUSES The stomach has a lining to  protect itself from the acid that digests food. The lining can be damaged in two main ways:  The Helico Pylori bacteria (H. Pyolori) can infect the lining of the stomach and cause ulcers.   Nonsteroidal, anti-inflammatory medications (NSAIDS) can cause gastric ulcerations.   Smoking tobacco can increase the acid in the stomach. This can lead to ulcers, and will impair healing of ulcers.   Other factors, such as alcohol use and stress may contribute to ulcer formation. Rarely, a tumor or cancer can cause an ulcer.   SYMPTOMS The problems (symptoms) of ulcer disease are usually a burning or gnawing of the  mid-upper belly (abdomen). This is often worse on an empty stomach and may get better with food. This may be associated with feeling sick to your stomach (nausea), bloating, and vomiting. If the ulcer results in bleeding, it can cause:  Black, tarry stools.   Vomiting of bright red blood.   Vomiting coffee-ground-looking materials.   SEVERE ANEMIA   HOME CARE INSTRUCTIONS Continue regular work and usual activities unless advised otherwise by your caregiver.  Avoid tobacco, alcohol, and caffeine. Tobacco use will decrease and slow the rates of healing.   Avoid foods that seem to aggravate or cause discomfort.   There are many over-the-counter products available to control stomach acid and other symptoms.   Special diets are not usually needed.   Keep any follow-up appointments and blood tests, as directed.   SEEK IMMEDIATE MEDICAL CARE IF:  You develop bright red, rectal bleeding; dark black, tarry stools; or vomit blood.   You become light-headed, weak, have fainting episodes, or become sweaty, cold and clammy.   You experience severe abdominal pain not controlled by medications.

## 2015-09-13 NOTE — Telephone Encounter (Signed)
PT needs a large volume paracentesis THUR IN [place] AT [facility]. HE NEEDS ALBUMIN 25 GMS IV AT ONSET AND REPEAT AFTER 4 LS. CALL PT WITH APPT TIME AND LET HIM KNOW, HE SHOULD HOLD HIS DIURETICS ON THE DAY OF THE PARACENTESIS. HE NEEDS TO STRICTLY FOLLOW A LOW SALT DIET.

## 2015-09-13 NOTE — Anesthesia Preprocedure Evaluation (Addendum)
Anesthesia Evaluation  Patient identified by MRN, date of birth, ID band Patient awake    Reviewed: Allergy & Precautions, NPO status , Unable to perform ROS - Chart review only  Airway Mallampati: I  TM Distance: >3 FB     Dental  (+) Teeth Intact, Partial Upper   Pulmonary shortness of breath and with exertion, Current Smoker,    breath sounds clear to auscultation       Cardiovascular negative cardio ROS   Rhythm:Regular Rate:Normal     Neuro/Psych    GI/Hepatic (+) Cirrhosis   ascites    ,   Endo/Other    Renal/GU      Musculoskeletal   Abdominal   Peds  Hematology   Anesthesia Other Findings   Reproductive/Obstetrics                            Anesthesia Physical Anesthesia Plan  ASA: III  Anesthesia Plan: MAC   Post-op Pain Management:    Induction: Intravenous  Airway Management Planned: Simple Face Mask  Additional Equipment:   Intra-op Plan:   Post-operative Plan:   Informed Consent: I have reviewed the patients History and Physical, chart, labs and discussed the procedure including the risks, benefits and alternatives for the proposed anesthesia with the patient or authorized representative who has indicated his/her understanding and acceptance.     Plan Discussed with:   Anesthesia Plan Comments:         Anesthesia Quick Evaluation

## 2015-09-13 NOTE — Anesthesia Postprocedure Evaluation (Signed)
  Anesthesia Post-op Note  Patient: Kevin Nicholson  Procedure(s) Performed: Procedure(s) with comments: ESOPHAGOGASTRODUODENOSCOPY (EGD) WITH PROPOFOL (N/A) - 1215 BIOPSY (N/A) - gastric,   Patient Location: PACU  Anesthesia Type:MAC  Level of Consciousness: awake  Airway and Oxygen Therapy: Patient Spontanous Breathing  Post-op Pain: none  Post-op Assessment: Post-op Vital signs reviewed, Patient's Cardiovascular Status Stable, Respiratory Function Stable, Patent Airway and No signs of Nausea or vomiting              Post-op Vital Signs: Reviewed and stable  Last Vitals:  Filed Vitals:   09/13/15 1103  BP: 134/79  Pulse: 77  Temp: 36.3 C  Resp: 20    Complications: No apparent anesthesia complications

## 2015-09-13 NOTE — Transfer of Care (Signed)
Immediate Anesthesia Transfer of Care Note  Patient: Kevin Nicholson  Procedure(s) Performed: Procedure(s) with comments: ESOPHAGOGASTRODUODENOSCOPY (EGD) WITH PROPOFOL (N/A) - 1215 BIOPSY (N/A) - gastric,   Patient Location: PACU  Anesthesia Type:MAC  Level of Consciousness: awake, alert  and oriented  Airway & Oxygen Therapy: Patient Spontanous Breathing and Patient connected to face mask oxygen  Post-op Assessment: Report given to RN  Post vital signs: Reviewed and stable  Last Vitals:  Filed Vitals:   09/13/15 1103  BP: 134/79  Pulse: 77  Temp: 36.3 C  Resp: 20    Complications: No apparent anesthesia complications

## 2015-09-13 NOTE — Telephone Encounter (Signed)
Called pt and he is set for 09/19/2015 @ [facility]. This was the first appt available between [facility] and [facility]. Pt is aware

## 2015-09-13 NOTE — H&P (Signed)
  Primary Care Physician:  Arnoldo Morale, MD Primary Gastroenterologist:  Dr. Oneida Alar  Pre-Procedure History & Physical: HPI:  Kevin Nicholson is a 54 y.o. male here for SCREENING FOR VARICES.  Past Medical History  Diagnosis Date  . Ascites   . Cirrhosis [facility])     Past Surgical History  Procedure Laterality Date  . Back surgery  2000  . Hand surgery      lost fingers while cleaning out saw at work, amputation     Prior to Admission medications   Medication Sig Start Date End Date Taking? Authorizing Provider  albuterol (PROVENTIL HFA;VENTOLIN HFA) 108 (90 BASE) MCG/ACT inhaler Inhale 2 puffs into the lungs every 6 (six) hours as needed for wheezing or shortness of breath. 08/09/15  Yes Josalyn Funches, MD  cetirizine (ZYRTEC) 10 MG tablet Take 1 tablet (10 mg total) by mouth daily. 08/23/15  Yes Josalyn Funches, MD  fluticasone (FLONASE) 50 MCG/ACT nasal spray Place 2 sprays into both nostrils daily. 08/09/15  Yes Josalyn Funches, MD  furosemide (LASIX) 40 MG tablet Take 1 tablet (40 mg total) by mouth daily. 08/09/15  Yes Josalyn Funches, MD  lactulose (CHRONULAC) 10 GM/15ML solution Take 15 mLs (10 g total) by mouth 2 (two) times daily as needed for mild constipation. 08/09/15  Yes Josalyn Funches, MD  spironolactone (ALDACTONE) 50 MG tablet Take 1 tablet (50 mg total) by mouth daily. 08/09/15   Boykin Nearing, MD    Allergies as of 08/08/2015  . (No Known Allergies)    Family History  Problem Relation Age of Onset  . Cancer Mother   . Cancer Father   . Colon cancer Neg Hx   . Liver disease Neg Hx     Social History   Social History  . Marital Status: Single    Spouse Name: N/A  . Number of Children: N/A  . Years of Education: N/A   Occupational History  . Not on file.   Social History Main Topics  . Smoking status: Current Every Day Smoker -- 1.00 packs/day for 45 years  . Smokeless tobacco: Not on file  . 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 Drug Use: No  . Sexual Activity: Not on file   Other Topics Concern  . Not on file   Social History Narrative    Review of Systems: See HPI, otherwise negative ROS   Physical Exam: BP 134/79 mmHg  Pulse 77  Temp(Src) 97.4 F (36.3 C) (Oral)  Resp 20  SpO2 100% General:   Alert,  pleasant and cooperative in NAD Head:  Normocephalic and atraumatic. Neck:  Supple; Lungs:  Clear throughout to auscultation.    Heart:  Regular rate and rhythm. Abdomen:  Soft, nontender and nondistended. Normal bowel sounds, without guarding, and without rebound.   Neurologic:  Alert and  oriented x4;  grossly normal neurologically.  Impression/Plan:    SCREENING VARICES  PLAN:  1.EGD TODAY

## 2015-09-13 NOTE — Progress Notes (Signed)
REVIEWED-NO ADDITIONAL RECOMMENDATIONS. 

## 2015-09-14 ENCOUNTER — Encounter (HOSPITAL_COMMUNITY): Payer: Self-pay | Admitting: Gastroenterology

## 2015-09-19 ENCOUNTER — Ambulatory Visit (HOSPITAL_COMMUNITY)
Admission: RE | Admit: 2015-09-19 | Discharge: 2015-09-19 | Disposition: A | Discharge status: Home / Self Care | Payer: Medicaid Other | Source: Ambulatory Visit | Attending: Gastroenterology | Admitting: Gastroenterology

## 2015-09-19 ENCOUNTER — Telehealth: Payer: Self-pay | Admitting: Gastroenterology

## 2015-09-19 ENCOUNTER — Other Ambulatory Visit: Payer: Self-pay

## 2015-09-19 DIAGNOSIS — R188 Other ascites: Secondary | ICD-10-CM | POA: Diagnosis not present

## 2015-09-19 MED ORDER — CLARITHROMYCIN 500 MG PO TABS: ORAL_TABLET | ORAL | Status: DC

## 2015-09-19 MED ORDER — AMOXICILLIN 500 MG PO TABS: ORAL_TABLET | ORAL | Status: DC

## 2015-09-19 MED ORDER — ALBUMIN HUMAN 25 % IV SOLN
25.0000 g | Freq: Once | INTRAVENOUS | Status: AC
  Administered 2015-09-19: 25 g via INTRAVENOUS

## 2015-09-19 MED ORDER — ALBUMIN HUMAN 25 % IV SOLN
25.0000 g | Freq: Once | INTRAVENOUS | Status: DC
Start: 2015-09-19 — End: 2016-07-26
  Filled 2015-09-19: qty 100

## 2015-09-19 MED ORDER — LIDOCAINE HCL (PF) 1 % IJ SOLN
INTRAMUSCULAR | Status: AC
  Filled 2015-09-19: qty 10

## 2015-09-19 NOTE — Telephone Encounter (Signed)
I called and informed pt. He wants to go to [facility] for Blood work tomorrow. He wants me to check with [facility] in the [place] and find out where to send him.

## 2015-09-19 NOTE — Telephone Encounter (Signed)
PLEASE CALL PT. HIS stomach Bx showed H. Pylori infection. He needs AMOXICILLIN 500 mg 2 po BID for 10 days and Biaxin 500 mg po bid for 10 days. CONTINUE OMEPRAZOLE BID.  TAKE ALL THE ABX AS PRESCRIBED. H PYLORI IF LEFT UNTREATED CAN LEAD TO STOMACH CANCER. Med side effects include NVD, abd pain, and metallic taste.   HIS ECG HAD CHANGES THAT DID NOT INDICATE DEFINITE HEART DISEASE BUT HE SHOULD SEE HIS PCP TO SEE IF HE NEEDS AN EVALUATION BY CARDIOLOGY.   TO KEEP FLUID FROM ACCUMULATING IN YOUR ABDOMEN, TAKE ALDACTONE 100 MG WITH LASIX 40 MG DAILY AND WATCH THE SALT IN YOUR DIET.  YOU NEED YOUR BLOOD DRAWN TUES TO CHECK YOUR KIDNEY FUNCTION AND POTASSIUM.  REPEAT EGD IN 3 MOS.  FOLLOW UP IN 6 MOS E30 CIRRHOSIS/H PYLORI GASTRITIS.

## 2015-09-19 NOTE — Procedures (Signed)
   US guided RLQ paracentesis 8.4 L yellow fluid obtained  25 gr pre procedure IV Albumin per MD 25 gr post procedure IV Albumin per MD  Pt tolerated well

## 2015-09-20 NOTE — Telephone Encounter (Signed)
PT is aware that I have called the [facility] [address] in [place] [phone]) and LMOVM for a return call with their fax number and I will fax his lab orders to them.

## 2015-09-20 NOTE — Telephone Encounter (Signed)
PT HAS FU OV AND IS ON RECALL

## 2015-09-21 ENCOUNTER — Other Ambulatory Visit: Payer: Self-pay

## 2015-09-21 ENCOUNTER — Telehealth: Payer: Self-pay

## 2015-09-21 DIAGNOSIS — R188 Other ascites: Principal | ICD-10-CM

## 2015-09-21 DIAGNOSIS — K7460 Unspecified cirrhosis of liver: Secondary | ICD-10-CM

## 2015-09-21 NOTE — Telephone Encounter (Signed)
PATIENT CALLED AGAIN INQUIRING ABOUT LABS TO BE DRAWN IN [place]    [phone]

## 2015-09-21 NOTE — Telephone Encounter (Signed)
See note of 09/19/2015.

## 2015-09-21 NOTE — Telephone Encounter (Signed)
I have faxed the orders to [facility] in [place] #[phone].  LMOM for pt that I am faxing .

## 2015-09-23 ENCOUNTER — Telehealth: Payer: Self-pay | Admitting: Gastroenterology

## 2015-09-23 ENCOUNTER — Encounter: Payer: Self-pay | Admitting: Family Medicine

## 2015-09-23 ENCOUNTER — Ambulatory Visit: Payer: Medicaid Other | Attending: Family Medicine | Admitting: Family Medicine

## 2015-09-23 VITALS — BP 107/70 | HR 78 | Temp 98.9°F | Resp 16 | Ht 71.0 in | Wt 185.0 lb

## 2015-09-23 DIAGNOSIS — Z114 Encounter for screening for human immunodeficiency virus [HIV]: Secondary | ICD-10-CM | POA: Diagnosis not present

## 2015-09-23 DIAGNOSIS — Z23 Encounter for immunization: Secondary | ICD-10-CM | POA: Insufficient documentation

## 2015-09-23 DIAGNOSIS — I8500 Esophageal varices without bleeding: Secondary | ICD-10-CM | POA: Diagnosis present

## 2015-09-23 DIAGNOSIS — K7031 Alcoholic cirrhosis of liver with ascites: Secondary | ICD-10-CM | POA: Insufficient documentation

## 2015-09-23 LAB — BASIC METABOLIC PANEL
BUN: 17 mg/dL (ref 7–25)
CO2: 29 mmol/L (ref 20–31)
CREATININE: 0.74 mg/dL (ref 0.70–1.33)
Calcium: 9.2 mg/dL (ref 8.6–10.3)
Chloride: 98 mmol/L (ref 98–110)
GLUCOSE: 97 mg/dL (ref 65–99)
Potassium: 5 mmol/L (ref 3.5–5.3)
Sodium: 133 mmol/L — ABNORMAL LOW (ref 135–146)

## 2015-09-23 NOTE — Progress Notes (Signed)
Patient ID: Kevin Nicholson, male   DOB: 12-Dec-1960, 54 y.o.   MRN: [id number]   Subjective:  Patient ID: Kevin Nicholson, male    DOB: 10/02/1961  Age: 54 y.o. MRN: [id number]  CC: Immunizations and Cirrhosis   HPI Kevin Nicholson presents for    1. 2nd hep B vaccine: getting second vaccine in series. Tolerated with first.  2. Cirrhosis: recent EGD revealed varices. Had recent paracentesis. Doing well. Mild chronic SOB. Minimal distension in abdomen with associated back pain. No fever, chills, bleeding. Bruising on arms.   Social History  Substance Use Topics  . Smoking status: Current Every Day Smoker -- 1.00 packs/day for 45 years  . Smokeless tobacco: Not on file  . 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 albuterol (PROVENTIL HFA;VENTOLIN HFA) 108 (90 BASE) MCG/ACT inhaler Inhale 2 puffs into the lungs every 6 (six) hours as needed for wheezing or shortness of breath. 1 Inhaler 2  . cetirizine (ZYRTEC) 10 MG tablet Take 1 tablet (10 mg total) by mouth daily. 30 tablet 11  . clarithromycin (BIAXIN) 500 MG tablet 1 PO BID FOR 10 DAYS. 20 tablet 0  . fluticasone (FLONASE) 50 MCG/ACT nasal spray Place 2 sprays into both nostrils daily. 16 g 6  . furosemide (LASIX) 40 MG tablet 1 PO DAILY With ALDACTONE 30 tablet 11  . lactulose (CHRONULAC) 10 GM/15ML solution Take 15 mLs (10 g total) by mouth 2 (two) times daily as needed for mild constipation. 946 mL 5  . omeprazole (PRILOSEC) 20 MG capsule 1 po bid 30 minutes before meals for 3 mos then once daily FOREVER 60 capsule 11  . spironolactone (ALDACTONE) 100 MG tablet Take 1 tablet (100 mg total) by mouth daily. 30 tablet 11  . amoxicillin (AMOXIL) 500 MG tablet 2 PO BID FOR 10 DAYS (Patient not taking: Reported on 09/23/2015) 40 tablet 0   Facility-Administered Medications Prior to Visit  Medication Dose Route Frequency  Provider Last Rate Last Dose  . albumin human 25 % solution 25 g  25 g Intravenous Once Danie Binder, MD        ROS Review of Systems  Constitutional: Negative for fever, chills, fatigue and unexpected weight change.  Eyes: Negative for visual disturbance.  Respiratory: Negative for cough and shortness of breath.   Cardiovascular: Negative for chest pain, palpitations and leg swelling.  Gastrointestinal: Negative for nausea, vomiting, abdominal pain, diarrhea, constipation and blood in stool.  Endocrine: Negative for polydipsia, polyphagia and polyuria.  Musculoskeletal: Negative for myalgias, back pain, arthralgias, gait problem and neck pain.  Skin: Negative for rash.  Allergic/Immunologic: Negative for immunocompromised state.  Hematological: Negative for adenopathy. Bruises/bleeds easily.  Psychiatric/Behavioral: Negative for suicidal ideas, sleep disturbance and dysphoric mood. The patient is not nervous/anxious.     Objective:  BP 107/70 mmHg  Pulse 78  Temp(Src) 98.9 F (37.2 C) (Oral)  Resp 16  Ht 5\' 11"  (1.803 m)  Wt 185 lb (83.915 kg)  BMI 25.81 kg/m2  SpO2 99%  BP/Weight 09/23/2015 09/19/2015 72/07/4708  Systolic BP 628 366 294  Diastolic BP 70 64 71  Wt. (Lbs) 185 193 -  BMI 25.81 26.93 -    Physical Exam  Constitutional: He appears well-developed and well-nourished. No distress.  HENT:  Head: Normocephalic and atraumatic.  Neck: Normal range of motion. Neck supple.  Cardiovascular: Normal  rate, regular rhythm, normal heart sounds and intact distal pulses.   Pulmonary/Chest: Effort normal and breath sounds normal.  Abdominal: Soft. Bowel sounds are normal. He exhibits no distension. There is no tenderness.  Musculoskeletal: He exhibits no edema or tenderness.  Neurological: He is alert.  Skin: Skin is warm and dry. No rash noted. No erythema.  Psychiatric: He has a normal mood and affect.     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 Due for Hep B and Hep A in 5 months.    Plan Hep A series of 2 at 0 and 6 months Plan Hep B series of 3 at 0, 1 and 6 months          Other Visit Diagnoses    Screening for HIV (human immunodeficiency virus)        Relevant Orders    HIV antibody (with reflex)    Need for Tdap vaccination        Relevant Orders    Tdap vaccine greater than or equal to 7yo IM (Completed)       No orders of the defined types were placed in this encounter.    Follow-up: Return in about 5 months (around 02/21/2016) for Hep A and Hep B.   Boykin Nearing MD

## 2015-09-23 NOTE — Progress Notes (Signed)
F/U cirrhosis  Pain scale #3 Immunisation #2 series Hep A Hep B

## 2015-09-23 NOTE — Assessment & Plan Note (Signed)
Patient received second Hep B vaccine today. Due for Hep B and Hep A in 5 months.    Plan Hep A series of 2 at 0 and 6 months Plan Hep B series of 3 at 0, 1 and 6 months

## 2015-09-23 NOTE — Telephone Encounter (Signed)
CALLED [facility]. NO BMP DRAWN. ORDER FOR BMP FAXED 2 DAYS AGO. ORDER IN. CALL PT TO REMIND HIM TO HAVE BLOOD DRAWN.

## 2015-09-23 NOTE — Patient Instructions (Addendum)
Kevin Nicholson was seen today for immunizations and cirrhosis.  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  F/u in 2 months with me for cirrhosis  Dr. Adrian Blackwater

## 2015-09-24 LAB — HIV ANTIBODY (ROUTINE TESTING W REFLEX): HIV 1&2 Ab, 4th Generation: NONREACTIVE

## 2015-09-26 ENCOUNTER — Ambulatory Visit (INDEPENDENT_AMBULATORY_CARE_PROVIDER_SITE_OTHER): Payer: Self-pay | Admitting: Gastroenterology

## 2015-09-26 ENCOUNTER — Other Ambulatory Visit: Payer: Self-pay

## 2015-09-26 ENCOUNTER — Encounter: Payer: Self-pay | Admitting: Gastroenterology

## 2015-09-26 VITALS — BP 106/61 | HR 85 | Temp 96.9°F | Ht 71.0 in | Wt 186.6 lb

## 2015-09-26 DIAGNOSIS — K7030 Alcoholic cirrhosis of liver without ascites: Secondary | ICD-10-CM

## 2015-09-26 NOTE — Telephone Encounter (Signed)
LMOM for pt that it is very important that he does his blood right away. Orders were faxed last week.  Told him to please let us know that he goes.

## 2015-09-26 NOTE — Telephone Encounter (Signed)
Routing to DS 

## 2015-09-26 NOTE — Progress Notes (Signed)
Referring Provider: Arnoldo Morale, MD Primary Care Physician:  No primary care provider on file.  Primary GI: Dr. Oneida Alar   Chief Complaint  Patient presents with  . Follow-up    HPI:   Kevin Nicholson is a 54 y.o. male presenting today with a history of ETOH cirrhosis, multiple LVAPs in past, EGD Oct 2016 with Grade 1 varices and PUD secondary to H.pylori. Needs surveillance in Jan 2017 for reassessment of PUD. Prescribed Amoxicillin/BiaxinPrilosec BID. Aldactone 100 mg with lasix 40 mg daily. Low sodium diet. Recent BMP on file, BUN/Cr remain normal. Due for surveillance ultrasound now.   Started abx on Friday. Chronic sharp pains in belly "all the time" but not constant. No diarrhea or constipation. Lasix and aldactone in morning. No lower extremity edema. Had paracentesis on 10/31 of 8.4 liters removed. In process of vaccinations, last one due in 6 months. Would like to pursue colonoscopy at time of next EGD in Jan 2017. Lactulose BID, has 1 BM a day.   Past Medical History  Diagnosis Date  . Ascites   . Cirrhosis [facility])     Past Surgical History  Procedure Laterality Date  . Back surgery  2000  . 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 Esophagogastroduodenoscopy (egd) with propofol N/A 09/13/2015    Dr. 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 +H.pylori . Surveillance due in Jan 2017 for ulcer healing  . Esophageal biopsy N/A 09/13/2015    Procedure: BIOPSY;  Surgeon: Danie Binder, MD;  Location: [facility];  Service: Endoscopy;  Laterality: N/A;  gastric,     Current Outpatient Prescriptions  Medication Sig Dispense Refill  . albuterol (PROVENTIL HFA;VENTOLIN HFA) 108 (90 BASE) MCG/ACT inhaler Inhale 2 puffs into the lungs every 6 (six) hours as needed for wheezing or shortness of breath. 1 Inhaler 2  . cetirizine (ZYRTEC) 10 MG tablet Take 1 tablet (10 mg total) by mouth daily. 30 tablet 11  .  clarithromycin (BIAXIN) 500 MG tablet 1 PO BID FOR 10 DAYS. 20 tablet 0  . fluticasone (FLONASE) 50 MCG/ACT nasal spray Place 2 sprays into both nostrils daily. 16 g 6  . furosemide (LASIX) 40 MG tablet 1 PO DAILY With ALDACTONE 30 tablet 11  . lactulose (CHRONULAC) 10 GM/15ML solution Take 15 mLs (10 g total) by mouth 2 (two) times daily as needed for mild constipation. 946 mL 5  . omeprazole (PRILOSEC) 20 MG capsule 1 po bid 30 minutes before meals for 3 mos then once daily FOREVER 60 capsule 11  . spironolactone (ALDACTONE) 100 MG tablet Take 1 tablet (100 mg total) by mouth daily. 30 tablet 11   No current facility-administered medications for this visit.   Facility-Administered Medications Ordered in Other Visits  Medication Dose Route Frequency Provider Last Rate Last Dose  . albumin human 25 % solution 25 g  25 g Intravenous Once Danie Binder, MD        Allergies as of 09/26/2015  . (No Known Allergies)    Family History  Problem Relation Age of Onset  . Cancer Mother   . Cancer Father   . Colon cancer Neg Hx   . Liver disease Neg Hx     Social History   Social History  . Marital Status: Single    Spouse Name: N/A  . Number of Children: N/A  . Years of Education: N/A  Social History Main Topics  . Smoking status: Current Every Day Smoker -- 1.00 packs/day for 45 years  . Smokeless tobacco: None  . 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 Drug Use: No  . Sexual Activity: Not Asked   Other Topics Concern  . None   Social History Narrative    Review of Systems: As mentioned in HPI   Physical Exam: BP 106/61 mmHg  Pulse 85  Temp(Src) 96.9 F (36.1 C)  Ht 5\' 11"  (1.803 m)  Wt 186 lb 9.6 oz (84.641 kg)  BMI 26.04 kg/m2 General:   Alert and oriented. No distress noted. Pleasant and cooperative.  Head:  Normocephalic and atraumatic. Abdomen:  +BS, soft, non-tender and non-distended. No rebound or guarding. Small  golf-ball sized mass-like area at umbilicus, protruding with valsalva maneuver. Likely umbilical hernia Msk:  Symmetrical without gross deformities. Normal posture. Extremities:  Without edema. Neurologic:  Alert and  oriented x4;  grossly normal neurologically. Psych:  Alert and cooperative. Normal mood and affect.  Lab Results  Component Value Date   WBC 6.1 09/09/2015   HGB 13.9 09/09/2015   HCT 40.2 09/09/2015   MCV 99.3 09/09/2015   PLT 174 09/09/2015   Lab Results  Component Value Date   ALT 20 06/21/2015   AST 37 06/21/2015   ALKPHOS 113 06/21/2015   BILITOT 2.4* 06/21/2015   Lab Results  Component Value Date   CREATININE 0.74 09/23/2015   BUN 17 09/23/2015   NA 133* 09/23/2015   K 5.0 09/23/2015   CL 98 09/23/2015   CO2 29 09/23/2015   Lab Results  Component Value Date   INR 1.20 06/28/2015

## 2015-09-26 NOTE — Telephone Encounter (Signed)
Pt returned call and he had labs done Friday. Results in epic.

## 2015-09-26 NOTE — Patient Instructions (Signed)
Keep up the good work!!  Continue the antibiotics until they are all gone. Do not skip any doses.   We have ordered a routine ultrasound for your liver.   I will see you back in January 2017 to schedule the colonoscopy and upper endoscopy.

## 2015-09-26 NOTE — Progress Notes (Signed)
CC'D TO PCP °

## 2015-09-26 NOTE — Assessment & Plan Note (Signed)
Recent EGD with Grade 1 varices and PUD secondary to H.pylori. Currently in middle of treatment for H.pylori. Due for surveillance ultrasound now. Remains abstinent from ETOH. Will continue Lasix 40 mg and Aldactone 100 mg daily. Return in Jan 2017 to arrange surveillance EGD and initial screening colonoscopy. In interim, will refer for transplant evaluation consideration. As of note, will discuss with Dr. Oneida Alar starting non-selective beta-blocker therapy for variceal bleeding prophylaxis.

## 2015-09-29 NOTE — Telephone Encounter (Signed)
PLEASE CALL PT. HIS LABS LOOK OK EXCEPT A SLIGHTLY LOW Na. IT IS DUE TO HIS TAKING DIURETICS.

## 2015-09-30 NOTE — Telephone Encounter (Signed)
Referral was made on 09/28/15. We are waiting on them to call us back.

## 2015-09-30 NOTE — Telephone Encounter (Signed)
Left VM that referral was made on 11/9 to [facility] and that it does take awhile for them to get in contact with him.

## 2015-09-30 NOTE — Telephone Encounter (Signed)
Pt is aware of his results. He said he was in the office on Monday and saw Laban Emperor, NP. He said she was to refer him somewhere for his liver, to [place] or [place] and he prefers [place]. I told him I did not see that in her notes, but he is to come back in Jan for OV and to schedule TCS and EGD and she might be doing it after that. I will let her know and then let him know.

## 2015-09-30 NOTE — Telephone Encounter (Signed)
LMOM to call.

## 2015-09-30 NOTE — Telephone Encounter (Signed)
It is in my notes :) Under my assessment and plan, it says to refer for transplant evaluation.   [facility]: what is status of this?

## 2015-10-03 ENCOUNTER — Ambulatory Visit (HOSPITAL_COMMUNITY)
Admission: RE | Admit: 2015-10-03 | Discharge: 2015-10-03 | Disposition: A | Discharge status: Home / Self Care | Payer: Medicaid Other | Source: Ambulatory Visit | Attending: Gastroenterology | Admitting: Gastroenterology

## 2015-10-03 DIAGNOSIS — R188 Other ascites: Secondary | ICD-10-CM | POA: Insufficient documentation

## 2015-10-03 DIAGNOSIS — J90 Pleural effusion, not elsewhere classified: Secondary | ICD-10-CM | POA: Insufficient documentation

## 2015-10-03 DIAGNOSIS — K7030 Alcoholic cirrhosis of liver without ascites: Secondary | ICD-10-CM | POA: Insufficient documentation

## 2015-10-05 ENCOUNTER — Telehealth: Payer: Self-pay | Admitting: *Deleted

## 2015-10-05 NOTE — Telephone Encounter (Signed)
LVM to return call.

## 2015-10-05 NOTE — Telephone Encounter (Signed)
-----   Message from Boykin Nearing, MD sent at 09/26/2015  8:59 AM EST ----- Junious Dresser, please call patient  Screening HIV negative Persistently slightly low sodium, stable over last 3 months   Dr. Oneida Alar, [facility]

## 2015-10-06 ENCOUNTER — Telehealth: Payer: Self-pay | Admitting: Family Medicine

## 2015-10-06 DIAGNOSIS — F17200 Nicotine dependence, unspecified, uncomplicated: Secondary | ICD-10-CM

## 2015-10-06 NOTE — Telephone Encounter (Signed)
Patient is calling to obtain results. Please follow up with pt. Thank you.

## 2015-10-06 NOTE — Telephone Encounter (Signed)
Patient returned phone call, please f/u °

## 2015-10-07 NOTE — Telephone Encounter (Signed)
Date of birth verified by pt Lab results given  Screening HIV negative Persistently slightly low sodium, stable over last 3 months  Pt verbalized understanding  Requesting help to stop smoking

## 2015-10-10 NOTE — Progress Notes (Signed)
Quick Note:  No HCC. Repeat in 6 months. ______ 

## 2015-10-11 NOTE — Progress Notes (Signed)
Quick Note:  Pt is aware of results. Also aware of OV in Jan. He said he received a call from the [facility] in [place] yesterday and they cannot schedule him because they [facility] not [facility]. He wants to know if the liver clinic in [place] would accept him.  Please advise! ______

## 2015-10-11 NOTE — Progress Notes (Signed)
Quick Note:  Routing to [facility] clinical pool regarding liver clinic options. ______

## 2015-10-11 NOTE — Progress Notes (Signed)
Quick Note:  LMOM to call. ______ 

## 2015-10-12 MED ORDER — VARENICLINE TARTRATE 1 MG PO TABS: 1.0000 mg | ORAL_TABLET | Freq: Two times a day (BID) | ORAL | Status: DC

## 2015-10-12 MED ORDER — VARENICLINE TARTRATE 0.5 MG X 11 & 1 MG X 42 PO MISC: ORAL | Status: DC

## 2015-10-12 NOTE — Telephone Encounter (Signed)
Called patient  Call completed   A: current smoker, ready to quit.  P: chantix ordered  Patient counseled regarding risk of depression/suicidal ideation and nightmares. He has none of these currently.

## 2015-10-12 NOTE — Progress Notes (Signed)
Quick Note:  Is there any one who will accept? ______

## 2015-10-12 NOTE — Assessment & Plan Note (Addendum)
A: current smoker, ready to quit.  P: chantix ordered  Patient counseled regarding risk of depression/suicidal ideation and nightmares. He has none of these currently.

## 2015-10-20 MED ORDER — NADOLOL 40 MG PO TABS: 40.0000 mg | ORAL_TABLET | Freq: Every day | ORAL | Status: DC

## 2015-10-20 MED ORDER — NADOLOL 20 MG PO TABS: 20.0000 mg | ORAL_TABLET | Freq: Every day | ORAL | Status: DC

## 2015-10-20 NOTE — Progress Notes (Signed)
LMOM to call.

## 2015-10-20 NOTE — Progress Notes (Signed)
Please call patient. I have started him on Nadolol 20 mg once daily for variceal bleed prophylaxis. I didn't start at 3, because he has a mean arterial pressure less than 85 (it is 76). Monitor for hypotension. Nadolol can cause fatigue and sexual dysfunction. It is important he starts this.

## 2015-10-20 NOTE — Addendum Note (Signed)
Addended by: Orvil Feil on: 10/20/2015 08:43 AM   Modules accepted: Orders, Medications

## 2015-10-20 NOTE — Progress Notes (Signed)
REVIEWED. ACCORDING TO THE 2014 ASGE GUIDELINES, HE WOULD BENEFIT FROM BETA BLOCKER PROPHYLAXIS TO DELAY TIME TO FIRST VARICEAL BLEED . AS WELL HE WILL NEED AN EGD TO SCREEN FOR VARICES EVERY YEAR BECAUSE HE HAS ETOH INDUCED CIRRHOSIS.

## 2015-10-21 NOTE — Progress Notes (Signed)
Pt called and is aware.  

## 2015-10-24 ENCOUNTER — Telehealth: Payer: Self-pay | Admitting: Gastroenterology

## 2015-10-24 NOTE — Telephone Encounter (Signed)
[facility]. Had to leave a voicemail with the rx information. Asked them to call or fax [place] if there were any problems with it.

## 2015-10-24 NOTE — Telephone Encounter (Signed)
Pt called this afternoon. He said someone called him Friday and told him that his prescription had been called into [facility] and he could go pick it up. He said when he got there he was told that they didn't have that prescription. He didn't know the name of the medication. [place] had been called in on 12/1 and there's a confirmation of receipt that pharmacy received it. I told the patient that the nurse would check on that for him. Please advise. [phone]

## 2015-10-25 NOTE — Telephone Encounter (Signed)
Noted  

## 2015-11-22 MED FILL — ?SPIRONOLACTONE 50 MG TABLE: 50 | 30 days supply | Qty: 60 | Fill #2

## 2015-11-22 MED FILL — FLUTICASONE PROP 50 MCG SPR: 50 | 30 days supply | Qty: 16 | Fill #2

## 2015-11-22 MED FILL — ALL DAY ALLERGY 10 MG TAB: 10 | 30 days supply | Qty: 30 | Fill #3

## 2015-11-22 MED FILL — ?FUROSEMIDE 40 MG TABLET: 40 | 30 days supply | Qty: 30 | Fill #2

## 2015-11-22 MED FILL — ?OMEPRAZOLE DR 20 MG CAPSUL: 20 | 30 days supply | Qty: 60 | Fill #2

## 2015-11-22 MED FILL — NADOLOL 20 MG TABLET: 20 | 30 days supply | Qty: 30 | Fill #1

## 2015-12-01 ENCOUNTER — Ambulatory Visit: Payer: Medicaid Other | Attending: Family Medicine | Admitting: Family Medicine

## 2015-12-01 ENCOUNTER — Encounter: Payer: Self-pay | Admitting: Family Medicine

## 2015-12-01 VITALS — BP 97/63 | HR 66 | Temp 98.4°F | Resp 16 | Ht 71.0 in | Wt 198.0 lb

## 2015-12-01 DIAGNOSIS — R188 Other ascites: Secondary | ICD-10-CM | POA: Insufficient documentation

## 2015-12-01 DIAGNOSIS — K7031 Alcoholic cirrhosis of liver with ascites: Secondary | ICD-10-CM

## 2015-12-01 DIAGNOSIS — Z6827 Body mass index (BMI) 27.0-27.9, adult: Secondary | ICD-10-CM | POA: Diagnosis not present

## 2015-12-01 DIAGNOSIS — R05 Cough: Secondary | ICD-10-CM | POA: Diagnosis not present

## 2015-12-01 DIAGNOSIS — F17210 Nicotine dependence, cigarettes, uncomplicated: Secondary | ICD-10-CM | POA: Diagnosis not present

## 2015-12-01 DIAGNOSIS — R109 Unspecified abdominal pain: Secondary | ICD-10-CM | POA: Insufficient documentation

## 2015-12-01 DIAGNOSIS — Z79899 Other long term (current) drug therapy: Secondary | ICD-10-CM | POA: Insufficient documentation

## 2015-12-01 DIAGNOSIS — K7460 Unspecified cirrhosis of liver: Secondary | ICD-10-CM | POA: Diagnosis present

## 2015-12-01 DIAGNOSIS — Z0000 Encounter for general adult medical examination without abnormal findings: Secondary | ICD-10-CM

## 2015-12-01 LAB — CBC
HEMATOCRIT: 42 % (ref 39.0–52.0)
HEMOGLOBIN: 14.6 g/dL (ref 13.0–17.0)
MCH: 34.2 pg — ABNORMAL HIGH (ref 26.0–34.0)
MCHC: 34.8 g/dL (ref 30.0–36.0)
MCV: 98.4 fL (ref 78.0–100.0)
MPV: 8.5 fL — AB (ref 8.6–12.4)
Platelets: 265 10*3/uL (ref 150–400)
RBC: 4.27 MIL/uL (ref 4.22–5.81)
RDW: 13.6 % (ref 11.5–15.5)
WBC: 6.5 10*3/uL (ref 4.0–10.5)

## 2015-12-01 LAB — COMPLETE METABOLIC PANEL WITH GFR
ALT: 23 U/L (ref 9–46)
AST: 31 U/L (ref 10–35)
Albumin: 3.6 g/dL (ref 3.6–5.1)
Alkaline Phosphatase: 162 U/L — ABNORMAL HIGH (ref 40–115)
BUN: 31 mg/dL — AB (ref 7–25)
CHLORIDE: 96 mmol/L — AB (ref 98–110)
CO2: 29 mmol/L (ref 20–31)
CREATININE: 1.09 mg/dL (ref 0.70–1.33)
Calcium: 9 mg/dL (ref 8.6–10.3)
GFR, EST AFRICAN AMERICAN: 88 mL/min (ref >=60)
GFR, Est Non African American: 76 mL/min (ref >=60)
Glucose, Bld: 72 mg/dL (ref 65–99)
POTASSIUM: 4.9 mmol/L (ref 3.5–5.3)
Sodium: 133 mmol/L — ABNORMAL LOW (ref 135–146)
Total Bilirubin: 1 mg/dL (ref 0.2–1.2)
Total Protein: 7.3 g/dL (ref 6.1–8.1)

## 2015-12-01 LAB — POCT GLYCOSYLATED HEMOGLOBIN (HGB A1C): HEMOGLOBIN A1C: 5

## 2015-12-01 MED ORDER — ALBUMIN HUMAN 25 % IV SOLN: 25.0000 g | Freq: Once | INTRAVENOUS | Status: DC

## 2015-12-01 NOTE — Progress Notes (Signed)
Patient ID: Kevin Nicholson, male   DOB: 03/25/1961, 55 y.o.   MRN: [id number]   Subjective:  Patient ID: Kevin Nicholson, male    DOB: [birth year]  Age: 55 y.o. MRN: [id number]  CC: Cirrhosis   HPI Kevin Nicholson presents for    1. F/u cirrhosis: patient has ETOH induced cirrhosis. He reports worsening abdominal swelling for past 2-3 weeks. During this time he has had URI with cough as well. Coughing exacerbated his abdominal pain. He has not had fever or chills. No nausea or emesis. The cough has improved. He continues to take lasix and aldactone. He is compliant with his low salt diet. He feels that he would benefit from another paracentesis. His last paracentesis as done on 09/19/2015., 8.4 L of fluid was removed. He has not yet contacted his gastroenterologist.   Social History  Substance Use Topics  . Smoking status: Current Every Day Smoker -- 1.00 packs/day for 45 years  . Smokeless tobacco: Not on file  . 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 albuterol (PROVENTIL HFA;VENTOLIN HFA) 108 (90 BASE) MCG/ACT inhaler Inhale 2 puffs into the lungs every 6 (six) hours as needed for wheezing or shortness of breath. 1 Inhaler 2  . cetirizine (ZYRTEC) 10 MG tablet Take 1 tablet (10 mg total) by mouth daily. 30 tablet 11  . clarithromycin (BIAXIN) 500 MG tablet 1 PO BID FOR 10 DAYS. 20 tablet 0  . fluticasone (FLONASE) 50 MCG/ACT nasal spray Place 2 sprays into both nostrils daily. 16 g 6  . furosemide (LASIX) 40 MG tablet 1 PO DAILY With ALDACTONE 30 tablet 11  . lactulose (CHRONULAC) 10 GM/15ML solution Take 15 mLs (10 g total) by mouth 2 (two) times daily as needed for mild constipation. 946 mL 5  . nadolol (CORGARD) 20 MG tablet Take 1 tablet (20 mg total) by mouth daily. 30 tablet 5  . omeprazole (PRILOSEC) 20 MG capsule 1 po bid 30 minutes before meals for 3 mos then once  daily FOREVER 60 capsule 11  . spironolactone (ALDACTONE) 100 MG tablet Take 1 tablet (100 mg total) by mouth daily. 30 tablet 11  . varenicline (CHANTIX CONTINUING MONTH PAK) 1 MG tablet Take 1 tablet (1 mg total) by mouth 2 (two) times daily. (Patient not taking: Reported on 12/01/2015) 60 tablet 2  . varenicline (CHANTIX STARTING MONTH PAK) 0.5 MG X 11 & 1 MG X 42 tablet Taper per packet insert (Patient not taking: Reported on 12/01/2015) 53 tablet 0   Facility-Administered Medications Prior to Visit  Medication Dose Route Frequency Provider Last Rate Last Dose  . albumin human 25 % solution 25 g  25 g Intravenous Once Danie Binder, MD        ROS Review of Systems  Constitutional: Negative for fever, chills, fatigue and unexpected weight change.  Eyes: Negative for visual disturbance.  Respiratory: Negative for cough and shortness of breath.   Cardiovascular: Negative for chest pain, palpitations and leg swelling.  Gastrointestinal: Positive for abdominal distention. Negative for nausea, vomiting, abdominal pain, diarrhea, constipation and blood in stool.  Endocrine: Negative for polydipsia, polyphagia and polyuria.  Musculoskeletal: Negative for myalgias, back pain, arthralgias, gait problem and neck pain.  Skin: Negative for rash.  Allergic/Immunologic: Negative for immunocompromised state.  Neurological: Negative for dizziness, syncope and weakness.  Hematological: Negative for adenopathy. Does  not bruise/bleed easily.  Psychiatric/Behavioral: Negative for suicidal ideas, sleep disturbance and dysphoric mood. The patient is not nervous/anxious.     Objective:  BP 97/63 mmHg  Pulse 66  Temp(Src) 98.4 F (36.9 C) (Oral)  Resp 16  Ht 5\' 11"  (1.803 m)  Wt 198 lb (89.812 kg)  BMI 27.63 kg/m2  SpO2 100%  BP/Weight 12/01/2015 09/26/2015 0000000  Systolic BP 97 [id number]  Diastolic BP 63 61 70  Wt. (Lbs) 198 186.6 185  BMI 27.63 26.04 25.81    Physical Exam  Constitutional:  He appears well-developed and well-nourished. No distress.  HENT:  Head: Normocephalic and atraumatic.  Neck: Normal range of motion. Neck supple.  Cardiovascular: Normal rate, regular rhythm, normal heart sounds and intact distal pulses.   Pulmonary/Chest: Effort normal and breath sounds normal.  Abdominal: Soft. Bowel sounds are normal. He exhibits distension. He exhibits no mass. There is generalized tenderness. There is no rebound and no guarding.  Mild diffuse tenderness  Musculoskeletal: He exhibits no edema.  Neurological: He is alert.  Skin: Skin is warm and dry. No rash noted. No erythema.  Psychiatric: He has a normal mood and affect.   Assessment & Plan:   Problem List Items Addressed This Visit    Ascites (Chronic)   Relevant Medications   albumin human 25 % solution 25 g (Start on 12/05/2015 12:00 AM)   Other Relevant Orders   COMPLETE METABOLIC PANEL WITH GFR   CBC   US Paracentesis   Hepatic cirrhosis (HCC) (Chronic)    Other Visit Diagnoses    Healthcare maintenance    -  Primary    Relevant Orders    POCT glycosylated hemoglobin (Hb A1C) (Completed)       No orders of the defined types were placed in this encounter.    Follow-up: No Follow-up on file.   Boykin Nearing MD

## 2015-12-01 NOTE — Progress Notes (Signed)
F/U abdominal fluid  Pain scale #5 Tobacco user 1ppday  No suicidal thought in the past two weeks

## 2015-12-01 NOTE — Assessment & Plan Note (Signed)
Continue lasix and aldactone at the current doses Continue low salt diet  Paracentesis scheduled, do not take lasix or aldactone on day of paracentesis  Please call your GI doctor for follow up as well

## 2015-12-01 NOTE — Patient Instructions (Addendum)
Diagnoses and all orders for this visit:  Healthcare maintenance -     POCT glycosylated hemoglobin (Hb A1C)  Ascites  Alcoholic cirrhosis of liver with ascites (HCC)    Continue lasix and aldactone at the current doses Continue low salt diet  Paracentesis scheduled, do not take lasix or aldactone on day of paracentesis  Please call your GI doctor for follow up as well   You will be called with lab results   F/u in 3 months   Dr. Adrian Blackwater

## 2015-12-02 ENCOUNTER — Telehealth: Payer: Self-pay | Admitting: Gastroenterology

## 2015-12-02 ENCOUNTER — Telehealth: Payer: Self-pay | Admitting: *Deleted

## 2015-12-02 NOTE — Telephone Encounter (Signed)
Pt called to let us know that he is having fluid drawn off on Monday at [facility] and he is also aware of his OV here on 1/25 to set up a repeat EGD

## 2015-12-02 NOTE — Telephone Encounter (Signed)
Pt notified us paracentesis Appointment change from Jan 16 to Jan 24 at 11:00am arriving at 10:45am at [facility]  Pt verbalized understanding

## 2015-12-05 ENCOUNTER — Ambulatory Visit (HOSPITAL_COMMUNITY): Payer: Medicaid Other

## 2015-12-05 NOTE — Telephone Encounter (Signed)
Noted  

## 2015-12-06 ENCOUNTER — Telehealth: Payer: Self-pay

## 2015-12-06 NOTE — Telephone Encounter (Signed)
Patient not  Available at this time Message left on voice mail to return our call

## 2015-12-06 NOTE — Telephone Encounter (Signed)
Returned patient phone call Patient is aware of his recent lab results

## 2015-12-06 NOTE — Telephone Encounter (Signed)
Pt returning call. Please contact pt at earliest convenience. Kevin Nicholson, ASA

## 2015-12-06 NOTE — Telephone Encounter (Signed)
-----   Message from Boykin Nearing, MD sent at 12/02/2015  8:31 AM EST ----- Persistent slightly low sodium on diuretics

## 2015-12-07 NOTE — Telephone Encounter (Signed)
Date of birth verified by pt  Lab results given Pt verbalized understanding 

## 2015-12-13 ENCOUNTER — Ambulatory Visit (HOSPITAL_COMMUNITY)
Admission: RE | Admit: 2015-12-13 | Discharge: 2015-12-13 | Disposition: A | Discharge status: Home / Self Care | Payer: Medicaid Other | Source: Ambulatory Visit | Attending: Family Medicine | Admitting: Family Medicine

## 2015-12-13 ENCOUNTER — Encounter (HOSPITAL_COMMUNITY): Payer: Self-pay

## 2015-12-13 ENCOUNTER — Encounter (HOSPITAL_COMMUNITY)
Admission: RE | Admit: 2015-12-13 | Discharge: 2015-12-13 | Disposition: A | Discharge status: Home / Self Care | Payer: Medicaid Other | Source: Ambulatory Visit | Attending: Family Medicine | Admitting: Family Medicine

## 2015-12-13 DIAGNOSIS — K7460 Unspecified cirrhosis of liver: Secondary | ICD-10-CM | POA: Insufficient documentation

## 2015-12-13 DIAGNOSIS — R188 Other ascites: Secondary | ICD-10-CM | POA: Diagnosis present

## 2015-12-13 HISTORY — DX: Reserved for inherently not codable concepts without codable children: IMO0001

## 2015-12-13 MED ORDER — ALBUMIN HUMAN 25 % IV SOLN
25.0000 g | Freq: Once | INTRAVENOUS | Status: AC
  Administered 2015-12-13: 25 g via INTRAVENOUS
  Filled 2015-12-13: qty 100

## 2015-12-13 MED ORDER — SODIUM CHLORIDE 0.9 % IV SOLN
INTRAVENOUS | Status: DC
  Administered 2015-12-13: 13:00:00 via INTRAVENOUS

## 2015-12-13 NOTE — Discharge Instructions (Signed)
Albumin injection °What is this medicine? °ALBUMIN (al BYOO min) is used to treat or prevent shock following serious injury, bleeding, surgery, or burns by increasing the volume of blood plasma. This medicine can also replace low blood protein. °This medicine may be used for other purposes; ask your health care provider or pharmacist if you have questions. °What should I tell my health care provider before I take this medicine? °They need to know if you have any of the following conditions: °-anemia °-heart disease °-kidney disease °-an unusual or allergic reaction to albumin, other medicines, foods, dyes, or preservatives °-pregnant or trying to get pregnant °-breast-feeding °How should I use this medicine? °This medicine is for infusion into a vein. It is given by a health-care professional in a hospital or clinic. °Talk to your pediatrician regarding the use of this medicine in children. While this drug may be prescribed for selected conditions, precautions do apply. °Overdosage: If you think you have taken too much of this medicine contact a poison control center or emergency room at once. °NOTE: This medicine is only for you. Do not share this medicine with others. °What if I miss a dose? °This does not apply. °What may interact with this medicine? °Interactions are not expected. °This list may not describe all possible interactions. Give your health care provider a list of all the medicines, herbs, non-prescription drugs, or dietary supplements you use. Also tell them if you smoke, drink alcohol, or use illegal drugs. Some items may interact with your medicine. °What should I watch for while using this medicine? °Your condition will be closely monitored while you receive this medicine. °Some products are derived from human plasma, and there is a small risk that these products may contain certain types of virus or bacteria. All products are processed to kill most viruses and bacteria. If you have questions  concerning the risk of infections, discuss them with your doctor or health care professional. °What side effects may I notice from receiving this medicine? °Side effects that you should report to your doctor or health care professional as soon as possible: °-allergic reactions like skin rash, itching or hives, swelling of the face, lips, or tongue °-breathing problems °-changes in heartbeat °-fever, chills °-pain, redness or swelling at the injection site °-signs of viral infection including fever, drowsiness, chills, runny nose followed in about 2 weeks by a rash and joint pain °-tightness in the chest °Side effects that usually do not require medical attention (report to your doctor or health care professional if they continue or are bothersome): °-increased salivation °-nausea, vomiting °This list may not describe all possible side effects. Call your doctor for medical advice about side effects. You may report side effects to FDA at 1-800-FDA-1088. °Where should I keep my medicine? °This does not apply. You will not be given this medicine to store at home. °NOTE: This sheet is a summary. It may not cover all possible information. If you have questions about this medicine, talk to your doctor, pharmacist, or health care provider. °  °© 2016, [employer]vier/[employer] Standard. (2008-01-29 10:18:55) ° °

## 2015-12-13 NOTE — Procedures (Signed)
Ultrasound-guided  therapeutic paracentesis performed yielding 7 liters of yellow  fluid. No immediate complications. The pt will receive IV albumin postprocedure.

## 2015-12-14 ENCOUNTER — Other Ambulatory Visit: Payer: Self-pay

## 2015-12-14 ENCOUNTER — Encounter: Payer: Self-pay | Admitting: Gastroenterology

## 2015-12-14 ENCOUNTER — Ambulatory Visit (INDEPENDENT_AMBULATORY_CARE_PROVIDER_SITE_OTHER): Payer: Medicaid Other | Admitting: Gastroenterology

## 2015-12-14 VITALS — BP 94/58 | HR 56 | Temp 97.8°F | Ht 71.0 in | Wt 184.2 lb

## 2015-12-14 DIAGNOSIS — K7460 Unspecified cirrhosis of liver: Secondary | ICD-10-CM

## 2015-12-14 DIAGNOSIS — K7031 Alcoholic cirrhosis of liver with ascites: Secondary | ICD-10-CM

## 2015-12-14 DIAGNOSIS — K279 Peptic ulcer, site unspecified, unspecified as acute or chronic, without hemorrhage or perforation: Secondary | ICD-10-CM

## 2015-12-14 DIAGNOSIS — Z1211 Encounter for screening for malignant neoplasm of colon: Secondary | ICD-10-CM

## 2015-12-14 MED ORDER — NA SULFATE-K SULFATE-MG SULF 17.5-3.13-1.6 GM/177ML PO SOLN: 1.0000 | Freq: Once | ORAL | Status: DC

## 2015-12-14 NOTE — Patient Instructions (Signed)
I don't believe the previous labs were done that check further into the liver disease. I have ordered these today. You may have it done at the [facility] in [place].   We have scheduled you for a colonoscopy and upper endoscopy with Dr. Oneida Alar in the near future.  We are referring you back to transplant evaluation.   We will see you in 6 months. We will arrange a routine ultrasound in May 2017.

## 2015-12-14 NOTE — Progress Notes (Signed)
Referring Provider: Arnoldo Morale, MD Primary Care Physician:  Minerva Ends, MD  Primary GI: Dr. Oneida Alar   Chief Complaint  Patient presents with  . Follow-up    HPI:   Kevin Nicholson is a 55 y.o. 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 Needs surveillance  now for reassessment of PUD. [place] Nov 2016 without [place].  Not taking Prilosec very often, stating he rarely gets heartburn.  No confusion or mental status changes. Occasional abdominal discomfort when fluid has accumulated. No lower extremity edema. In process of completing vaccinations. He has not had complete work-up for etiology of cirrhosis, although it has been felt it is secondary to ETOH. No prior colonoscopy.    Past Medical History  Diagnosis Date  . Ascites   . Cirrhosis ([place])   . Shortness of breath dyspnea     with stair climbing    Past Surgical History  Procedure Laterality Date  . Back surgery  2000  . 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 Esophagogastroduodenoscopy (egd) with propofol N/A 09/13/2015    Dr. 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 +H.pylori . Surveillance due in Jan 2017 for ulcer healing  . Esophageal biopsy N/A 09/13/2015    Procedure: BIOPSY;  Surgeon: Danie Binder, MD;  Location: [facility];  Service: Endoscopy;  Laterality: N/A;  gastric,     Current Outpatient Prescriptions  Medication Sig Dispense Refill  . albuterol (PROVENTIL HFA;VENTOLIN HFA) 108 (90 BASE) MCG/ACT inhaler Inhale 2 puffs into the lungs every 6 (six) hours as needed for wheezing or shortness of breath. 1 Inhaler 2  . cetirizine (ZYRTEC) 10 MG tablet Take 1 tablet (10 mg total) by mouth daily. 30 tablet 11  . fluticasone (FLONASE) 50 MCG/ACT nasal spray Place 2 sprays into both nostrils  daily. 16 g 6  . furosemide (LASIX) 40 MG tablet 1 PO DAILY With ALDACTONE 30 tablet 11  . lactulose (CHRONULAC) 10 GM/15ML solution Take 15 mLs (10 g total) by mouth 2 (two) times daily as needed for mild constipation. 946 mL 5  . nadolol (CORGARD) 20 MG tablet Take 1 tablet (20 mg total) by mouth daily. 30 tablet 5  . omeprazole (PRILOSEC) 20 MG capsule 1 po bid 30 minutes before meals for 3 mos then once daily FOREVER 60 capsule 11  . spironolactone (ALDACTONE) 100 MG tablet Take 1 tablet (100 mg total) by mouth daily. 30 tablet 11  . clarithromycin (BIAXIN) 500 MG tablet 1 PO BID FOR 10 DAYS. (Patient not taking: Reported on 12/14/2015) 20 tablet 0   Current Facility-Administered Medications  Medication Dose Route Frequency Provider Last Rate Last Dose  . albumin human 25 % solution 25 g  25 g Intravenous Once Boykin Nearing, MD       Facility-Administered Medications Ordered in Other Visits  Medication Dose Route Frequency Provider Last Rate Last Dose  . albumin human 25 % solution 25 g  25 g Intravenous Once Danie Binder, MD        Allergies as of 12/14/2015  . (No Known Allergies)    Family History  Problem Relation Age of Onset  . Cancer Mother   . Cancer Father   . Colon cancer Neg Hx   .  Liver disease Neg Hx     Social History   Social History  . Marital Status: Single    Spouse Name: N/A  . Number of Children: N/A  . Years of Education: N/A   Social History Main Topics  . Smoking status: Current Every Day Smoker -- 1.00 packs/day for 45 years  . Smokeless tobacco: None  . 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 Drug Use: No  . Sexual Activity: Not Asked   Other Topics Concern  . None   Social History Narrative    Review of Systems: As mentioned in HPI.   Physical Exam: BP 94/58 mmHg  Pulse 56  Temp(Src) 97.8 F (36.6 C) (Oral)  Ht 5\' 11"  (1.803 m)  Wt 184 lb 3.2 oz (83.553 kg)  BMI 25.70 kg/m2 General:    Alert and oriented. No distress noted. Pleasant and cooperative.  Head:  Normocephalic and atraumatic. Eyes:  Conjuctiva clear without scleral icterus. Mouth:  Oral mucosa pink and moist. Good dentition. No lesions. Heart:  S1, S2 present without murmurs, rubs, or gallops. Regular rate and rhythm. Abdomen:  +BS, soft, non-tender and non-distended.Questionable umbilical hernia Msk:  Symmetrical without gross deformities. Normal posture. Extremities:  Without edema. Neurologic:  Alert and  oriented x4;  grossly normal neurologically. Psych:  Alert and cooperative. Normal mood and affect.

## 2015-12-20 LAB — COMPLETE METABOLIC PANEL WITH GFR
ALBUMIN: 3.4 g/dL — AB (ref 3.6–5.1)
ALK PHOS: 149 U/L — AB (ref 40–115)
ALT: 17 U/L (ref 9–46)
AST: 22 U/L (ref 10–35)
BILIRUBIN TOTAL: 0.7 mg/dL (ref 0.2–1.2)
BUN: 29 mg/dL — AB (ref 7–25)
CALCIUM: 8.9 mg/dL (ref 8.6–10.3)
CO2: 28 mmol/L (ref 20–31)
CREATININE: 1.15 mg/dL (ref 0.70–1.33)
Chloride: 100 mmol/L (ref 98–110)
GFR, Est African American: 82 mL/min (ref >=60)
GFR, Est Non African American: 71 mL/min (ref >=60)
Glucose, Bld: 73 mg/dL (ref 65–99)
POTASSIUM: 4.3 mmol/L (ref 3.5–5.3)
Sodium: 137 mmol/L (ref 135–146)
Total Protein: 7 g/dL (ref 6.1–8.1)

## 2015-12-20 LAB — FERRITIN: Ferritin: 575 ng/mL — ABNORMAL HIGH (ref 22–322)

## 2015-12-20 LAB — IRON AND TIBC
%SAT: 70 % — AB (ref 15–60)
IRON: 207 ug/dL — AB (ref 50–180)
TIBC: 294 ug/dL (ref 250–425)
UIBC: 87 ug/dL — AB (ref 125–400)

## 2015-12-20 LAB — PROTIME-INR
INR: 1.11 (ref <1.50)
PROTHROMBIN TIME: 14.4 s (ref 11.6–15.2)

## 2015-12-20 LAB — ANTI-SMOOTH MUSCLE ANTIBODY, IGG: SMOOTH MUSCLE AB: 23 U — AB (ref <20)

## 2015-12-20 LAB — ANA: Anti Nuclear Antibody(ANA): NEGATIVE

## 2015-12-21 LAB — MITOCHONDRIAL ANTIBODIES: Mitochondrial M2 Ab, IgG: <=20 Units (ref <=20.0)

## 2015-12-21 LAB — CERULOPLASMIN: Ceruloplasmin: 35 mg/dL (ref 18–36)

## 2015-12-21 MED FILL — ALL DAY ALLERGY 10 MG TAB: 10 | 30 days supply | Qty: 30 | Fill #4

## 2015-12-21 MED FILL — NADOLOL 20 MG TABLET: 20 | 30 days supply | Qty: 30 | Fill #2

## 2015-12-21 MED FILL — ?SPIRONOLACTONE 50 MG TABLE: 50 | 30 days supply | Qty: 60 | Fill #3

## 2015-12-21 NOTE — Assessment & Plan Note (Addendum)
Likely due to ETOH cirrhosis; however, it appears he has not had complete serologies performed. Will order this now. Next US abdomen in May 2017 to be scheduled. As he has been abstinent from ETOH since March 2016, will refer for transplant evaluation consideration. Continue low dose Nadolol at 20 mg per day (due to concern for hypotension). Patient at goal heart rate and asymptomatic.

## 2015-12-21 NOTE — Progress Notes (Signed)
REVIEWED-NO ADDITIONAL RECOMMENDATIONS. 

## 2015-12-21 NOTE — Assessment & Plan Note (Signed)
No prior screening colonoscopy. No concerning lower GI symptoms.   Proceed with colonoscopy with Dr. Oneida Alar in the near future. The risks, benefits, and alternatives have been discussed in detail with the patient. They state understanding and desire to proceed.  PROPOFOL due to polypharmacy

## 2015-12-21 NOTE — Assessment & Plan Note (Signed)
55 year old male with history of PUD secondary to H.pylori, s/p EGD Oct 2016 and need for surveillance EGD. Also notable history of cirrhosis, with Grade 1 varices on EGD Oct 2016. Advised to continue PPI daily, as he had only been taking prn.   Proceed with upper endoscopy in the near future with Dr. Oneida Alar. The risks, benefits, and alternatives have been discussed in detail with patient. They have stated understanding and desire to proceed.  PROPOFOL due to history of ETOH abuse.  Consider yearly variceal surveillance due to history of probable ETOH cirrhosis  Continue Prilosec once daily

## 2015-12-21 NOTE — Progress Notes (Signed)
CC'D TO PCP °

## 2015-12-28 MED FILL — SUPREP BOWEL PREP KIT: 17.5-3.13-1 | 1 days supply | Qty: 354 | Fill #0

## 2015-12-28 NOTE — Patient Instructions (Signed)
Kevin Nicholson  12/28/2015     @PREFPERIOPPHARMACY @   Your procedure is scheduled on  01/03/2016   Report to [name] at  51  A.M.  Call this number if you have problems the morning of surgery:  [phone]   Remember:  Do not eat food or drink liquids after midnight.  Take these medicines the morning of surgery with A SIP OF WATER  Zyrtec, nadolol, prilosec. Take your inhaler before you come and bring it with you.   Do not wear jewelry, make-up or nail polish.  Do not wear lotions, powders, or perfumes.  You may wear deodorant.  Do not shave 48 hours prior to surgery.  Men may shave face and neck.  Do not bring valuables to the hospital.  [facility] is not responsible for any belongings or valuables.  Contacts, dentures or bridgework may not be worn into surgery.  Leave your suitcase in the car.  After surgery it may be brought to your room.  For patients admitted to the hospital, discharge time will be determined by your treatment team.  Patients discharged the day of surgery will not be allowed to drive home.   Name and phone number of your driver:   family Special instructions:  Follow the diet and prep instructions given to you by Dr Nona Dell office.  Please read over the following fact sheets that you were given. Coughing and Deep Breathing, Surgical Site Infection Prevention, Anesthesia Post-op Instructions and Care and Recovery After Surgery      Esophagogastroduodenoscopy Esophagogastroduodenoscopy (EGD) is a procedure that is used to examine the lining of the esophagus, stomach, and first part of the small intestine (duodenum). A long, flexible, lighted tube with a camera attached (endoscope) is inserted down the throat to view these organs. This procedure is done to detect problems or abnormalities, such as inflammation, bleeding, ulcers, or growths, in order to treat them. The procedure lasts 5-20 minutes. It is usually an outpatient  procedure, but it may need to be performed in a hospital in emergency cases. LET [facility] CARE PROVIDER KNOW ABOUT:  Any allergies you have.  All medicines you are taking, including vitamins, herbs, eye drops, creams, and over-the-counter medicines.  Previous problems you or members of your family have had with the use of anesthetics.  Any blood disorders you have.  Previous surgeries you have had.  Medical conditions you have. RISKS AND COMPLICATIONS Generally, this is a safe procedure. However, problems can occur and include:  Infection.  Bleeding.  Tearing (perforation) of the esophagus, stomach, or duodenum.  Difficulty breathing or not being able to breathe.  Excessive sweating.  Spasms of the larynx.  Slowed heartbeat.  Low blood pressure. BEFORE THE PROCEDURE  Do not eat or drink anything after midnight on the night before the procedure or as directed by your health care provider.  Do not take your regular medicines before the procedure if your health care provider asks you not to. Ask your health care provider about changing or stopping those medicines.  If you wear dentures, be prepared to remove them before the procedure.  Arrange for someone to drive you home after the procedure. PROCEDURE  A numbing medicine (local anesthetic) may be sprayed in your throat for comfort and to stop you from gagging or coughing.  You will have an IV tube inserted in a vein in your hand or arm. You will receive  medicines and fluids through this tube.  You will be given a medicine to relax you (sedative).  A pain reliever will be given through the IV tube.  A mouth guard may be placed in your mouth to protect your teeth and to keep you from biting on the endoscope.  You will be asked to lie on your left side.  The endoscope will be inserted down your throat and into your esophagus, stomach, and duodenum.  Air will be put through the endoscope to allow your health  care provider to clearly view the lining of your esophagus.  The lining of your esophagus, stomach, and duodenum will be examined. During the exam, your health care provider may:  Remove tissue to be examined under a microscope (biopsy) for inflammation, infection, or other medical problems.  Remove growths.  Remove objects (foreign bodies) that are stuck.  Treat any bleeding with medicines or other devices that stop tissues from bleeding (hot cautery, clipping devices).  Widen (dilate) or stretch narrowed areas of your esophagus and stomach.  The endoscope will be withdrawn. AFTER THE PROCEDURE  You will be taken to a recovery area for observation. Your blood pressure, heart rate, breathing rate, and blood oxygen level will be monitored often until the medicines you were given have worn off.  Do not eat or drink anything until the numbing medicine has worn off and your gag reflex has returned. You may choke.  Your health care provider should be able to discuss his or her findings with you. It will take longer to discuss the test results if any biopsies were taken.   This information is not intended to replace advice given to you by your health care provider. Make sure you discuss any questions you have with your health care provider.   Document Released: 03/08/2005 Document Revised: 11/26/2014 Document Reviewed: 10/08/2012 Elsevier Interactive Patient Education 2016 [place]. Esophagogastroduodenoscopy, Care After Refer to this sheet in the next few weeks. These instructions provide you with information about caring for yourself after your procedure. Your health care provider may also give you more specific instructions. Your treatment has been planned according to current medical practices, but problems sometimes occur. Call your health care provider if you have any problems or questions after your procedure. WHAT TO EXPECT AFTER THE PROCEDURE After your procedure, it is typical  to feel:  Soreness in your throat.  Pain with swallowing.  Sick to your stomach (nauseous).  Bloated.  Dizzy.  Fatigued. HOME CARE INSTRUCTIONS  Do not eat or drink anything until the numbing medicine (local anesthetic) has worn off and your gag reflex has returned. You will know that the local anesthetic has worn off when you can swallow comfortably.  Do not drive or operate machinery until directed by your health care provider.  Take medicines only as directed by your health care provider. SEEK MEDICAL CARE IF:   You cannot stop coughing.  You are not urinating at all or less than usual. SEEK IMMEDIATE MEDICAL CARE IF:  You have difficulty swallowing.  You cannot eat or drink.  You have worsening throat or chest pain.  You have dizziness or lightheadedness or you faint.  You have nausea or vomiting.  You have chills.  You have a fever.  You have severe abdominal pain.  You have black, tarry, or bloody stools.   This information is not intended to replace advice given to you by your health care provider. Make sure you discuss any questions you have  with your health care provider.   Document Released: 10/22/2012 Document Revised: 11/26/2014 Document Reviewed: 10/22/2012 Elsevier Interactive Patient Education 2016 [employer]. Colonoscopy A colonoscopy is an exam to look at the entire large intestine (colon). This exam can help find problems such as tumors, polyps, inflammation, and areas of bleeding. The exam takes about 1 hour.  LET [facility] CARE PROVIDER KNOW ABOUT:   Any allergies you have.  All medicines you are taking, including vitamins, herbs, eye drops, creams, and over-the-counter medicines.  Previous problems you or members of your family have had with the use of anesthetics.  Any blood disorders you have.  Previous surgeries you have had.  Medical conditions you have. RISKS AND COMPLICATIONS  Generally, this is a safe procedure.  However, as with any procedure, complications can occur. Possible complications include:  Bleeding.  Tearing or rupture of the colon wall.  Reaction to medicines given during the exam.  Infection (rare). BEFORE THE PROCEDURE   Ask your health care provider about changing or stopping your regular medicines.  You may be prescribed an oral bowel prep. This involves drinking a large amount of medicated liquid, starting the day before your procedure. The liquid will cause you to have multiple loose stools until your stool is almost clear or light green. This cleans out your colon in preparation for the procedure.  Do not eat or drink anything else once you have started the bowel prep, unless your health care provider tells you it is safe to do so.  Arrange for someone to drive you home after the procedure. PROCEDURE   You will be given medicine to help you relax (sedative).  You will lie on your side with your knees bent.  A long, flexible tube with a light and camera on the end (colonoscope) will be inserted through the rectum and into the colon. The camera sends video back to a computer screen as it moves through the colon. The colonoscope also releases carbon dioxide gas to inflate the colon. This helps your health care provider see the area better.  During the exam, your health care provider may take a small tissue sample (biopsy) to be examined under a microscope if any abnormalities are found.  The exam is finished when the entire colon has been viewed. AFTER THE PROCEDURE   Do not drive for 24 hours after the exam.  You may have a small amount of blood in your stool.  You may pass moderate amounts of gas and have mild abdominal cramping or bloating. This is caused by the gas used to inflate your colon during the exam.  Ask when your test results will be ready and how you will get your results. Make sure you get your test results.   This information is not intended to replace  advice given to you by your health care provider. Make sure you discuss any questions you have with your health care provider.   Document Released: 11/02/2000 Document Revised: 08/26/2013 Document Reviewed: 07/13/2013 Elsevier Interactive Patient Education 2016 [employer] Inc. Colonoscopy, Care After Refer to this sheet in the next few weeks. These instructions provide you with information on caring for yourself after your procedure. Your health care provider may also give you more specific instructions. Your treatment has been planned according to current medical practices, but problems sometimes occur. Call your health care provider if you have any problems or questions after your procedure. WHAT TO EXPECT AFTER THE PROCEDURE  After your procedure, it is typical  to have the following:  A small amount of blood in your stool.  Moderate amounts of gas and mild abdominal cramping or bloating. HOME CARE INSTRUCTIONS  Do not drive, operate machinery, or sign important documents for 24 hours.  You may shower and resume your regular physical activities, but move at a slower pace for the first 24 hours.  Take frequent rest periods for the first 24 hours.  Walk around or put a warm pack on your abdomen to help reduce abdominal cramping and bloating.  Drink enough fluids to keep your urine clear or pale yellow.  You may resume your normal diet as instructed by your health care provider. Avoid heavy or fried foods that are hard to digest.  Avoid drinking alcohol for 24 hours or as instructed by your health care provider.  Only take over-the-counter or prescription medicines as directed by your health care provider.  If a tissue sample (biopsy) was taken during your procedure:  Do not take aspirin or blood thinners for 7 days, or as instructed by your health care provider.  Do not drink alcohol for 7 days, or as instructed by your health care provider.  Eat soft foods for the first 24  hours. SEEK MEDICAL CARE IF: You have persistent spotting of blood in your stool 2-3 days after the procedure. SEEK IMMEDIATE MEDICAL CARE IF:  You have more than a small spotting of blood in your stool.  You pass large blood clots in your stool.  Your abdomen is swollen (distended).  You have nausea or vomiting.  You have a fever.  You have increasing abdominal pain that is not relieved with medicine.   This information is not intended to replace advice given to you by your health care provider. Make sure you discuss any questions you have with your health care provider.   Document Released: 06/19/2004 Document Revised: 08/26/2013 Document Reviewed: 07/13/2013 Elsevier Interactive Patient Education 2016 Elsevier Inc. PATIENT INSTRUCTIONS POST-ANESTHESIA  IMMEDIATELY FOLLOWING SURGERY:  Do not drive or operate machinery for the first twenty four hours after surgery.  Do not make any important decisions for twenty four hours after surgery or while taking narcotic pain medications or sedatives.  If you develop intractable nausea and vomiting or a severe headache please notify your doctor immediately.  FOLLOW-UP:  Please make an appointment with your surgeon as instructed. You do not need to follow up with anesthesia unless specifically instructed to do so.  WOUND CARE INSTRUCTIONS (if applicable):  Keep a dry clean dressing on the anesthesia/puncture wound site if there is drainage.  Once the wound has quit draining you may leave it open to air.  Generally you should leave the bandage intact for twenty four hours unless there is drainage.  If the epidural site drains for more than 36-48 hours please call the anesthesia department.  QUESTIONS?:  Please feel free to call your physician or the hospital operator if you have any questions, and they will be happy to assist you.

## 2015-12-29 ENCOUNTER — Encounter (HOSPITAL_COMMUNITY)
Admission: RE | Admit: 2015-12-29 | Discharge: 2015-12-29 | Disposition: A | Discharge status: Home / Self Care | Payer: Medicaid Other | Source: Ambulatory Visit | Attending: Gastroenterology | Admitting: Gastroenterology

## 2015-12-29 ENCOUNTER — Encounter (HOSPITAL_COMMUNITY): Payer: Self-pay

## 2015-12-29 DIAGNOSIS — R0602 Shortness of breath: Secondary | ICD-10-CM | POA: Diagnosis present

## 2015-12-29 DIAGNOSIS — K7460 Unspecified cirrhosis of liver: Secondary | ICD-10-CM | POA: Diagnosis present

## 2015-12-29 DIAGNOSIS — R188 Other ascites: Secondary | ICD-10-CM | POA: Diagnosis present

## 2015-12-29 LAB — CBC WITH DIFFERENTIAL/PLATELET
Basophils Absolute: 0.1 10*3/uL (ref 0.0–0.1)
Basophils Relative: 1 %
EOS ABS: 0.2 10*3/uL (ref 0.0–0.7)
EOS PCT: 3 %
HCT: 41.7 % (ref 39.0–52.0)
Hemoglobin: 14.9 g/dL (ref 13.0–17.0)
LYMPHS ABS: 2 10*3/uL (ref 0.7–4.0)
LYMPHS PCT: 28 %
MCH: 35.5 pg — AB (ref 26.0–34.0)
MCHC: 35.7 g/dL (ref 30.0–36.0)
MCV: 99.3 fL (ref 78.0–100.0)
MONO ABS: 0.9 10*3/uL (ref 0.1–1.0)
MONOS PCT: 13 %
Neutro Abs: 4.1 10*3/uL (ref 1.7–7.7)
Neutrophils Relative %: 55 %
PLATELETS: 224 10*3/uL (ref 150–400)
RBC: 4.2 MIL/uL — ABNORMAL LOW (ref 4.22–5.81)
RDW: 13.4 % (ref 11.5–15.5)
WBC: 7.2 10*3/uL (ref 4.0–10.5)

## 2015-12-29 NOTE — Pre-Procedure Instructions (Signed)
Patient given information to sign up for my chart at home. 

## 2016-01-03 ENCOUNTER — Ambulatory Visit (HOSPITAL_COMMUNITY)
Admission: RE | Admit: 2016-01-03 | Discharge: 2016-01-03 | Disposition: A | Discharge status: Home / Self Care | Payer: Medicaid Other | Source: Ambulatory Visit | Attending: Gastroenterology | Admitting: Gastroenterology

## 2016-01-03 ENCOUNTER — Encounter (HOSPITAL_COMMUNITY)
Admission: RE | Disposition: A | Discharge status: Home / Self Care | Payer: Self-pay | Source: Ambulatory Visit | Attending: Gastroenterology

## 2016-01-03 ENCOUNTER — Encounter (HOSPITAL_COMMUNITY): Payer: Self-pay | Admitting: *Deleted

## 2016-01-03 ENCOUNTER — Ambulatory Visit (HOSPITAL_COMMUNITY): Payer: Medicaid Other | Admitting: Anesthesiology

## 2016-01-03 DIAGNOSIS — F17210 Nicotine dependence, cigarettes, uncomplicated: Secondary | ICD-10-CM | POA: Insufficient documentation

## 2016-01-03 DIAGNOSIS — D123 Benign neoplasm of transverse colon: Secondary | ICD-10-CM | POA: Insufficient documentation

## 2016-01-03 DIAGNOSIS — K3189 Other diseases of stomach and duodenum: Secondary | ICD-10-CM | POA: Insufficient documentation

## 2016-01-03 DIAGNOSIS — K279 Peptic ulcer, site unspecified, unspecified as acute or chronic, without hemorrhage or perforation: Secondary | ICD-10-CM | POA: Diagnosis present

## 2016-01-03 DIAGNOSIS — K2950 Unspecified chronic gastritis without bleeding: Secondary | ICD-10-CM | POA: Insufficient documentation

## 2016-01-03 DIAGNOSIS — Z79899 Other long term (current) drug therapy: Secondary | ICD-10-CM | POA: Insufficient documentation

## 2016-01-03 DIAGNOSIS — Z1211 Encounter for screening for malignant neoplasm of colon: Secondary | ICD-10-CM | POA: Diagnosis not present

## 2016-01-03 DIAGNOSIS — D125 Benign neoplasm of sigmoid colon: Secondary | ICD-10-CM | POA: Insufficient documentation

## 2016-01-03 DIAGNOSIS — K635 Polyp of colon: Secondary | ICD-10-CM

## 2016-01-03 DIAGNOSIS — K7460 Unspecified cirrhosis of liver: Secondary | ICD-10-CM | POA: Diagnosis not present

## 2016-01-03 DIAGNOSIS — D128 Benign neoplasm of rectum: Secondary | ICD-10-CM | POA: Insufficient documentation

## 2016-01-03 DIAGNOSIS — Z7951 Long term (current) use of inhaled steroids: Secondary | ICD-10-CM | POA: Diagnosis not present

## 2016-01-03 DIAGNOSIS — K2970 Gastritis, unspecified, without bleeding: Secondary | ICD-10-CM

## 2016-01-03 DIAGNOSIS — I8500 Esophageal varices without bleeding: Secondary | ICD-10-CM | POA: Diagnosis not present

## 2016-01-03 HISTORY — PX: COLONOSCOPY WITH PROPOFOL: SHX5780

## 2016-01-03 HISTORY — PX: POLYPECTOMY: SHX5525

## 2016-01-03 HISTORY — PX: BIOPSY: SHX5522

## 2016-01-03 HISTORY — PX: ESOPHAGOGASTRODUODENOSCOPY (EGD) WITH PROPOFOL: SHX5813

## 2016-01-03 SURGERY — ESOPHAGOGASTRODUODENOSCOPY (EGD) WITH PROPOFOL
Anesthesia: Monitor Anesthesia Care

## 2016-01-03 MED ORDER — LACTATED RINGERS IV SOLN
INTRAVENOUS | Status: DC
  Administered 2016-01-03 (×2): via INTRAVENOUS

## 2016-01-03 MED ORDER — LIDOCAINE HCL (CARDIAC) 10 MG/ML IV SOLN
INTRAVENOUS | Status: DC | PRN
  Administered 2016-01-03: 50 mg via INTRAVENOUS

## 2016-01-03 MED ORDER — EPHEDRINE SULFATE 50 MG/ML IJ SOLN
INTRAMUSCULAR | Status: AC
  Filled 2016-01-03: qty 1

## 2016-01-03 MED ORDER — FENTANYL CITRATE (PF) 100 MCG/2ML IJ SOLN
INTRAMUSCULAR | Status: AC
  Filled 2016-01-03: qty 2

## 2016-01-03 MED ORDER — MIDAZOLAM HCL 2 MG/2ML IJ SOLN
INTRAMUSCULAR | Status: AC
  Filled 2016-01-03: qty 2

## 2016-01-03 MED ORDER — SODIUM CHLORIDE 0.9 % IJ SOLN
INTRAMUSCULAR | Status: AC
  Filled 2016-01-03: qty 10

## 2016-01-03 MED ORDER — GLYCOPYRROLATE 0.2 MG/ML IJ SOLN
0.2000 mg | Freq: Once | INTRAMUSCULAR | Status: AC
  Administered 2016-01-03: 0.2 mg via INTRAVENOUS

## 2016-01-03 MED ORDER — LIDOCAINE VISCOUS 2 % MT SOLN
15.0000 mL | Freq: Once | OROMUCOSAL | Status: AC
  Administered 2016-01-03: 15 mL via OROMUCOSAL

## 2016-01-03 MED ORDER — FENTANYL CITRATE (PF) 100 MCG/2ML IJ SOLN
25.0000 ug | INTRAMUSCULAR | Status: AC
  Administered 2016-01-03: 25 ug via INTRAVENOUS

## 2016-01-03 MED ORDER — GLYCOPYRROLATE 0.2 MG/ML IJ SOLN
INTRAMUSCULAR | Status: AC
  Filled 2016-01-03: qty 1

## 2016-01-03 MED ORDER — EPHEDRINE SULFATE 50 MG/ML IJ SOLN
INTRAMUSCULAR | Status: DC | PRN
  Administered 2016-01-03 (×3): 10 mg via INTRAVENOUS

## 2016-01-03 MED ORDER — ONDANSETRON HCL 4 MG/2ML IJ SOLN
INTRAMUSCULAR | Status: AC
  Filled 2016-01-03: qty 2

## 2016-01-03 MED ORDER — PROPOFOL 10 MG/ML IV BOLUS
INTRAVENOUS | Status: AC
  Filled 2016-01-03: qty 20

## 2016-01-03 MED ORDER — MIDAZOLAM HCL 2 MG/2ML IJ SOLN
1.0000 mg | INTRAMUSCULAR | Status: DC | PRN
  Administered 2016-01-03: 2 mg via INTRAVENOUS

## 2016-01-03 MED ORDER — LIDOCAINE HCL (PF) 1 % IJ SOLN
INTRAMUSCULAR | Status: AC
  Filled 2016-01-03: qty 5

## 2016-01-03 MED ORDER — MIDAZOLAM HCL 5 MG/5ML IJ SOLN
INTRAMUSCULAR | Status: DC | PRN
  Administered 2016-01-03: 2 mg via INTRAVENOUS

## 2016-01-03 MED ORDER — STERILE WATER FOR IRRIGATION IR SOLN
Status: DC | PRN
  Administered 2016-01-03: 09:00:00

## 2016-01-03 MED ORDER — LIDOCAINE VISCOUS 2 % MT SOLN
OROMUCOSAL | Status: AC
  Filled 2016-01-03: qty 15

## 2016-01-03 MED ORDER — FENTANYL CITRATE (PF) 100 MCG/2ML IJ SOLN: 25.0000 ug | INTRAMUSCULAR | Status: DC | PRN

## 2016-01-03 MED ORDER — PROPOFOL 500 MG/50ML IV EMUL
INTRAVENOUS | Status: DC | PRN
  Administered 2016-01-03: 09:00:00 via INTRAVENOUS
  Administered 2016-01-03: 150 ug/kg/min via INTRAVENOUS

## 2016-01-03 MED ORDER — ONDANSETRON HCL 4 MG/2ML IJ SOLN: 4.0000 mg | Freq: Once | INTRAMUSCULAR | Status: DC | PRN

## 2016-01-03 NOTE — Anesthesia Preprocedure Evaluation (Signed)
Anesthesia Evaluation  Patient identified by MRN, date of birth, ID band Patient awake    Reviewed: Allergy & Precautions, NPO status , Patient's Chart, lab work & pertinent test results  Airway Mallampati: I  TM Distance: >3 FB     Dental  (+) Partial Lower   Pulmonary shortness of breath and with exertion, Current Smoker,    breath sounds clear to auscultation       Cardiovascular  Rhythm:Regular Rate:Normal     Neuro/Psych    GI/Hepatic PUD, (+) Cirrhosis   Esophageal Varices and ascites  substance abuse (quit etoh 1 yr ago)  alcohol use,   Endo/Other    Renal/GU      Musculoskeletal   Abdominal   Peds  Hematology   Anesthesia Other Findings   Reproductive/Obstetrics                             Anesthesia Physical Anesthesia Plan  ASA: III  Anesthesia Plan: MAC   Post-op Pain Management:    Induction: Intravenous  Airway Management Planned: Simple Face Mask  Additional Equipment:   Intra-op Plan:   Post-operative Plan:   Informed Consent: I have reviewed the patients History and Physical, chart, labs and discussed the procedure including the risks, benefits and alternatives for the proposed anesthesia with the patient or authorized representative who has indicated his/her understanding and acceptance.     Plan Discussed with:   Anesthesia Plan Comments:         Anesthesia Quick Evaluation

## 2016-01-03 NOTE — H&P (Signed)
Primary Care Physician:  Minerva Ends, MD Primary Gastroenterologist:  Dr. Oneida Alar  Pre-Procedure History & Physical: HPI:  Kevin Nicholson is a 55 y.o. male here for SCREENING: colon cancer/PUD.  Past Medical History  Diagnosis Date  . Ascites   . Cirrhosis ([place])     varices  . Shortness of breath dyspnea     with stair climbing    Past Surgical History  Procedure Laterality Date  . Back surgery  2000  . 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 Esophagogastroduodenoscopy (egd) with propofol N/A 09/13/2015    Dr. 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 +H.pylori . Surveillance due in Jan 2017 for ulcer healing  . Biopsy N/A 09/13/2015    Procedure: BIOPSY;  Surgeon: Danie Binder, MD;  Location: [facility];  Service: Endoscopy;  Laterality: N/A;  gastric,     Prior to Admission medications   Medication Sig Start Date End Date Taking? Authorizing Provider  albuterol (PROVENTIL HFA;VENTOLIN HFA) 108 (90 BASE) MCG/ACT inhaler Inhale 2 puffs into the lungs every 6 (six) hours as needed for wheezing or shortness of breath. 08/09/15  Yes Josalyn Funches, MD  cetirizine (ZYRTEC) 10 MG tablet Take 1 tablet (10 mg total) by mouth daily. 08/23/15  Yes Josalyn Funches, MD  fluticasone (FLONASE) 50 MCG/ACT nasal spray Place 2 sprays into both nostrils daily. 08/09/15  Yes Josalyn Funches, MD  furosemide (LASIX) 40 MG tablet 1 PO DAILY With ALDACTONE 09/13/15  Yes Danie Binder, MD  lactulose (CHRONULAC) 10 GM/15ML solution Take 15 mLs (10 g total) by mouth 2 (two) times daily as needed for mild constipation. 08/09/15  Yes Josalyn Funches, MD  Na Sulfate-K Sulfate-Mg Sulf SOLN Take 1 kit by mouth once. 12/14/15 01/13/16 Yes Orvil Feil, NP  nadolol (CORGARD) 20 MG tablet Take 1 tablet (20 mg total) by mouth daily. 10/20/15  Yes Orvil Feil, NP  omeprazole (PRILOSEC) 20 MG capsule 1 po bid 30 minutes before meals for 3 mos then  once daily FOREVER 09/13/15  Yes Danie Binder, MD  spironolactone (ALDACTONE) 100 MG tablet Take 1 tablet (100 mg total) by mouth daily. 09/13/15  Yes Danie Binder, MD    Allergies as of 12/14/2015  . (No Known Allergies)    Family History  Problem Relation Age of Onset  . Cancer Mother   . Cancer Father   . Colon cancer Neg Hx   . Liver disease Neg Hx     Social History   Social History  . Marital Status: Single    Spouse Name: N/A  . Number of Children: N/A  . Years of Education: N/A   Occupational History  . Not on file.   Social History Main Topics  . Smoking status: Current Every Day Smoker -- 1.00 packs/day for 45 years    Types: Cigarettes  . Smokeless tobacco: Not on file  . 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 Drug Use: No  . Sexual Activity: Not on file   Other Topics Concern  . Not on file   Social History Narrative    Review of Systems: See HPI, otherwise negative ROS   Physical Exam: Temp(Src) 97.5 F (36.4 C) (Oral) General:   Alert,  pleasant and cooperative in NAD Head:  Normocephalic and atraumatic. Neck:  Supple; Lungs:  Clear  throughout to auscultation.    Heart:  Regular rate and rhythm. Abdomen:  Soft, nontender and nondistended. Normal bowel sounds, without guarding, and without rebound.   Neurologic:  Alert and  oriented x4;  grossly normal neurologically.  Impression/Plan:    SCREENING: colon cancer/PUD  Plan:  1. TCS/EGD TODAY

## 2016-01-03 NOTE — Anesthesia Procedure Notes (Signed)
Procedure Name: MAC Date/Time: 01/03/2016 8:56 AM Performed by: Andree Elk, Cedric Denison A Pre-anesthesia Checklist: Patient identified, Emergency Drugs available, Suction available, Patient being monitored and Timeout performed Oxygen Delivery Method: Simple face mask

## 2016-01-03 NOTE — Op Note (Signed)
[facility] [address] [place], [address]   ENDOSCOPY PROCEDURE REPORT  PATIENT: Kevin Nicholson, Kevin Nicholson  MR#: [id number] BIRTHDATE: 01-24-1961 , 14  yrs. old GENDER: male  ENDOSCOPIST: Danie Binder, MD REFERRED FG:[id number] Funches, MD PROCEDURE DATE: 01-21-16 PROCEDURE:   EGD w/ biopsy  INDICATIONS:screening for GASTRIC ulcers/H pylori gastritis(OCT 2016) MEDICATIONS: Monitored anesthesia care  TOPICAL ANESTHETIC: Viscous Xylocaine  ASA CLASS:  DESCRIPTION OF PROCEDURE:     Physical exam was performed.  Informed consent was obtained from the patient after explaining the benefits, risks, and alternatives to the procedure.  The patient was connected to the monitor and placed in the left lateral position.  Continuous oxygen was provided by nasal cannula and IV medicine administered through an indwelling cannula.  After administration of sedation, the patients esophagus was intubated and the [id number]  endoscope was advanced under direct visualization to the second portion of the duodenum.  The scope was removed slowly by carefully examining the color, texture, anatomy, and integrity of the mucosa on the way out.  The patient was recovered in endoscopy and discharged home in satisfactory condition.  Estimated blood loss is zero unless otherwise noted in this procedure report.    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   STOMACH: Moderate gastropathy was found in the gastric body and gastric fundus.   Mild non-erosive gastritis (inflammation) was found in the gastric antrum.  Multiple biopsies were performed using cold forceps.   DUODENUM: The duodenal mucosa showed no abnormalities in the bulb and 2nd part of the duodenum. COMPLICATIONS: There were no immediate complications.  ENDOSCOPIC IMPRESSION: 1.   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   MILD PORTAL Gastropathy in the gastric body and gastric fundus 3.   MILD Non-erosive  gastritis  RECOMMENDATIONS: DRINK WATER TO KEEP URINE LIGHT YELLOW. AVOID ITEMS THAT TRIGGER GASTRITIS. FOLLOW A HIGH FIBER/LOW FAT DIET. AWAIT BIOPSY RESULTS. Next colonoscopy in 1-3 years.  YOUR SISTERS, BROTHERS, CHILDREN, AND PARENTS NEED TO HAVE A COLONOSCOPY STARTING AT THE AGE OF 40. FOLLOW UP IN JUL 2017.  REPEAT EXAM:  eSigned:  Danie Binder, MD 21-Jan-2016 4:06 PM  CPT CODES: ICD CODES:  The ICD and CPT codes recommended by this software are interpretations from the data that the clinical staff has captured with the software.  The verification of the translation of this report to the ICD and CPT codes and modifiers is the sole responsibility of the health care institution and practicing physician where this report was generated.  [employer] will not be held responsible for the validity of the ICD and CPT codes included on this report.  AMA assumes no liability for data contained or not contained herein. CPT is a Designer, television/film set of the [employer].

## 2016-01-03 NOTE — Op Note (Signed)
[facility] [address] [place], [address]   COLONOSCOPY PROCEDURE REPORT  PATIENT: Kevin Nicholson, Kevin Nicholson  MR#: [id number] BIRTHDATE: 08/18/1961 , 58  yrs. old GENDER: male ENDOSCOPIST: Danie Binder, MD REFERRED [id number] Funches, MD PROCEDURE DATE:  2016/01/26 PROCEDURE:   Colonoscopy with cold biopsy and Colonoscopy with snare polypectomy INDICATIONS:average risk patient for colon cancer.  MEDICATIONS: Monitored anesthesia care  DESCRIPTION OF PROCEDURE:    Physical exam was performed.  Informed consent was obtained from the patient after explaining the benefits, risks, and alternatives to procedure.  The patient was connected to monitor and placed in left lateral position. Continuous oxygen was provided by nasal cannula and IV medicine administered through an indwelling cannula.  After administration of sedation and rectal exam, the patients rectum was intubated and the [id number]  colonoscope was advanced under direct visualization to the cecum.  The scope was removed slowly by carefully examining the color, texture, anatomy, and integrity mucosa on the way out.  The patient was recovered in endoscopy and discharged home in satisfactory condition. Estimated blood loss is zero unless otherwise noted in this procedure report.    COLON FINDINGS: Five sessile polyps ranging from 2 to 53mm in size were found in the rectum(3) and sigmoid colon(2).  A polypectomy was performed with cold forceps/SNARE.  SIX sessile polyps ranging from 6 to 62mm in size were found in the rectum(2), sigmoid colon(2), and transverse colon(2).  A polypectomy was performed using snare cautery. There was moderate diverticulosis noted in the sigmoid colon with associated muscular hypertrophy and tortuosity. Small internal hemorrhoids were found. Moderate sized external hemorrhoids were found.  PREP QUALITY: excellent CECAL W/D TIME: 25       minutes COMPLICATIONS:  None  ENDOSCOPIC IMPRESSION: 1.   11 COLORECTAL POLYPS REMOVED 2.   Small internal hemorrhoids 3.   Moderate diverticulosis noted in the sigmoid colon 4.   Moderate sized external hemorrhoids  RECOMMENDATIONS: DRINK WATER TO KEEP URINE LIGHT YELLOW. AVOID ITEMS THAT TRIGGER GASTRITIS. FOLLOW A HIGH FIBER/LOW FAT DIET. AWAIT BIOPSY RESULTS. Next colonoscopy in 1-3 years.  ALL SISTERS, BROTHERS, CHILDREN, AND PARENTS NEED TO HAVE A COLONOSCOPY STARTING AT THE AGE OF 40. FOLLOW UP IN JUL 2017.    eSigned:  Danie Binder, MD Jan 26, 2016 3:58 PM   CPT CODES: ICD CODES:  The ICD and CPT codes recommended by this software are interpretations from the data that the clinical staff has captured with the software.  The verification of the translation of this report to the ICD and CPT codes and modifiers is the sole responsibility of the health care institution and practicing physician where this report was generated.  [employer]. will not be held responsible for the validity of the ICD and CPT codes included on this report.  AMA assumes no liability for data contained or not contained herein. CPT is a Designer, television/film set of the [employer].

## 2016-01-03 NOTE — Anesthesia Postprocedure Evaluation (Signed)
Anesthesia Post Note  Patient: Kevin Nicholson  Procedure(s) Performed: Procedure(s) (LRB): ESOPHAGOGASTRODUODENOSCOPY (EGD) WITH PROPOFOL (N/A) COLONOSCOPY WITH PROPOFOL (N/A) POLYPECTOMY BIOPSY  Patient location during evaluation: PACU Anesthesia Type: MAC Level of consciousness: awake and alert and oriented Pain management: pain level controlled Vital Signs Assessment: post-procedure vital signs reviewed and stable Respiratory status: spontaneous breathing Cardiovascular status: stable Postop Assessment: no signs of nausea or vomiting Anesthetic complications: no    Last Vitals:  Filed Vitals:   01/03/16 0815 01/03/16 0820  BP: 110/68 91/51  Temp:    Resp: 13 15    Last Pain: There were no vitals filed for this visit.               Serayah Yazdani A

## 2016-01-03 NOTE — Transfer of Care (Signed)
Immediate Anesthesia Transfer of Care Note  Patient: Kevin Nicholson  Procedure(s) Performed: Procedure(s) with comments: ESOPHAGOGASTRODUODENOSCOPY (EGD) WITH PROPOFOL (N/A) - 0845 COLONOSCOPY WITH PROPOFOL (N/A) POLYPECTOMY - transverse colon polyps x2, sigmoid colon polyps x4, rectal polyps x5 BIOPSY - gastric bx's  Patient Location: PACU  Anesthesia Type:MAC  Level of Consciousness: awake, alert , oriented and patient cooperative  Airway & Oxygen Therapy: Patient Spontanous Breathing and Patient connected to nasal cannula oxygen  Post-op Assessment: Report given to RN and Post -op Vital signs reviewed and stable  Post vital signs: Reviewed and stable  Last Vitals:  Filed Vitals:   01/03/16 0815 01/03/16 0820  BP: 110/68 91/51  Temp:    Resp: 13 15    Complications: No apparent anesthesia complications

## 2016-01-03 NOTE — Discharge Instructions (Signed)
You had 11 polyps removed. You have small internal hemorrhoids & DIVERTICULOSIS IN YOUR LEFT COLON. You have Mild gastritis.  I biopsied your stomach.    DRINK WATER TO KEEP YOUR URINE LIGHT YELLOW.  AVOID ITEMS THAT TRIGGER GASTRITIS. SEE INFO BELOW.  FOLLOW A HIGH FIBER/LOW FAT DIET. AVOID ITEMS THAT CAUSE BLOATING. SEE INFO BELOW.  YOUR BIOPSY RESULTS WILL BE AVAILABLE IN MY CHART FEB 17 AND MY OFFICE WILL CONTACT YOU IN 10-14 DAYS WITH YOUR RESULTS.   Next colonoscopy in 1-3 years. YOUR SISTERS, BROTHERS, CHILDREN, AND PARENTS NEED TO HAVE A COLONOSCOPY STARTING AT THE AGE OF 40.   FOLLOW UP IN JUL 2017.  ENDOSCOPY Care After Read the instructions outlined below and refer to this sheet in the next week. These discharge instructions provide you with general information on caring for yourself after you leave the hospital. While your treatment has been planned according to the most current medical practices available, unavoidable complications occasionally occur. If you have any problems or questions after discharge, call DR. FIELDS, [phone].  ACTIVITY  You may resume your regular activity, but move at a slower pace for the next 24 hours.   Take frequent rest periods for the next 24 hours.   Walking will help get rid of the air and reduce the bloated feeling in your belly (abdomen).   No driving for 24 hours (because of the medicine (anesthesia) used during the test).   You may shower.   Do not sign any important legal documents or operate any machinery for 24 hours (because of the anesthesia used during the test).    NUTRITION  Drink plenty of fluids.   You may resume your normal diet as instructed by your doctor.   Begin with a light meal and progress to your normal diet. Heavy or fried foods are harder to digest and may make you feel sick to your stomach (nauseated).   Avoid alcoholic beverages for 24 hours or as instructed.    MEDICATIONS  You may resume your  normal medications.   WHAT YOU CAN EXPECT TODAY  Some feelings of bloating in the abdomen.   Passage of more gas than usual.   Spotting of blood in your stool or on the toilet paper  .  IF YOU HAD POLYPS REMOVED DURING THE ENDOSCOPY:  Eat a soft diet IF YOU HAVE NAUSEA, BLOATING, ABDOMINAL PAIN, OR VOMITING.    FINDING OUT THE RESULTS OF YOUR TEST Not all test results are available during your visit. DR. Oneida Alar WILL CALL YOU WITHIN 14 DAYS OF YOUR PROCEDUE WITH YOUR RESULTS. Do not assume everything is normal if you have not heard from DR. FIELDS, CALL HER OFFICE AT [phone].  SEEK IMMEDIATE MEDICAL ATTENTION AND CALL THE OFFICE: [phone] IF:  You have more than a spotting of blood in your stool.   Your belly is swollen (abdominal distention).   You are nauseated or vomiting.   You have a temperature over 101F.   You have abdominal pain or discomfort that is severe or gets worse throughout the day.   Gastritis  Gastritis is inflammation (the body's way of reacting to injury and/or infection) of the stomach. It is often caused by viral or bacterial (germ) infections. It can also be caused BY ASPIRIN, BC/GOODY POWDER'S, (IBUPROFEN) MOTRIN, OR ALEVE (NAPROXEN), chemicals (including alcohol), SPICY FOODS, and medications. This illness may be associated with generalized malaise (feeling tired, not well), UPPER ABDOMINAL STOMACH cramps, and fever. One common bacterial cause  of gastritis is an organism known as H. Pylori. This can be treated with antibiotics.    High-Fiber Diet A high-fiber diet changes your normal diet to include more whole grains, legumes, fruits, and vegetables. Changes in the diet involve replacing refined carbohydrates with unrefined foods. The calorie level of the diet is essentially unchanged. The Dietary Reference Intake (recommended amount) for adult males is 38 grams per day. For adult females, it is 25 grams per day. Pregnant and lactating women  should consume 28 grams of fiber per day. Fiber is the intact part of a plant that is not broken down during digestion. Functional fiber is fiber that has been isolated from the plant to provide a beneficial effect in the body. PURPOSE  Increase stool bulk.   Ease and regulate bowel movements.   Lower cholesterol.  INDICATIONS THAT YOU NEED MORE FIBER  Constipation and hemorrhoids.   Uncomplicated diverticulosis (intestine condition) and irritable bowel syndrome.   Weight management.   As a protective measure against hardening of the arteries (atherosclerosis), diabetes, and cancer.   GUIDELINES FOR INCREASING FIBER IN THE DIET  Start adding fiber to the diet slowly. A gradual increase of about 5 more grams (2 slices of whole-wheat bread, 2 servings of most fruits or vegetables, or 1 bowl of high-fiber cereal) per day is best. Too rapid an increase in fiber may result in constipation, flatulence, and bloating.   Drink enough water and fluids to keep your urine clear or pale yellow. Water, juice, or caffeine-free drinks are recommended. Not drinking enough fluid may cause constipation.   Eat a variety of high-fiber foods rather than one type of fiber.   Try to increase your intake of fiber through using high-fiber foods rather than fiber pills or supplements that contain small amounts of fiber.   The goal is to change the types of food eaten. Do not supplement your present diet with high-fiber foods, but replace foods in your present diet.   INCLUDE A VARIETY OF FIBER SOURCES  Replace refined and processed grains with whole grains, canned fruits with fresh fruits, and incorporate other fiber sources. White rice, white breads, and most bakery goods contain little or no fiber.   Brown whole-grain rice, buckwheat oats, and many fruits and vegetables are all good sources of fiber. These include: broccoli, Brussels sprouts, cabbage, cauliflower, beets, sweet potatoes, white potatoes  (skin on), carrots, tomatoes, eggplant, squash, berries, fresh fruits, and dried fruits.   Cereals appear to be the richest source of fiber. Cereal fiber is found in whole grains and bran. Bran is the fiber-rich outer coat of cereal grain, which is largely removed in refining. In whole-grain cereals, the bran remains. In breakfast cereals, the largest amount of fiber is found in those with "bran" in their names. The fiber content is sometimes indicated on the label.   You may need to include additional fruits and vegetables each day.   In baking, for 1 cup white flour, you may use the following substitutions:   1 cup whole-wheat flour minus 2 tablespoons.   1/2 cup white flour plus 1/2 cup whole-wheat flour.   Low-Fat Diet BREADS, CEREALS, PASTA, RICE, DRIED PEAS, AND BEANS These products are high in carbohydrates and most are low in fat. Therefore, they can be increased in the diet as substitutes for fatty foods. They too, however, contain calories and should not be eaten in excess. Cereals can be eaten for snacks as well as for breakfast.  Include foods that  contain fiber (fruits, vegetables, whole grains, and legumes). Research shows that fiber may lower blood cholesterol levels, especially the water-soluble fiber found in fruits, vegetables, oat products, and legumes. FRUITS AND VEGETABLES It is good to eat fruits and vegetables. Besides being sources of fiber, both are rich in vitamins and some minerals. They help you get the daily allowances of these nutrients. Fruits and vegetables can be used for snacks and desserts. MEATS Limit lean meat, chicken, [place], and fish to no more than 6 ounces per day. Beef, Pork, and Lamb Use lean cuts of beef, pork, and lamb. Lean cuts include:  Extra-lean ground beef.  Arm roast.  Sirloin tip.  Center-cut ham.  Round steak.  Loin chops.  Rump roast.  Tenderloin.  Trim all fat off the outside of meats before cooking. It is not necessary to  severely decrease the intake of red meat, but lean choices should be made. Lean meat is rich in protein and contains a highly absorbable form of iron. Premenopausal women, in particular, should avoid reducing lean red meat because this could increase the risk for low red blood cells (iron-deficiency anemia). The organ meats, such as liver, sweetbreads, kidneys, and brain are very rich in cholesterol. They should be limited. Chicken and [place] These are good sources of protein. The fat of poultry can be reduced by removing the skin and underlying fat layers before cooking. Chicken and [place] can be substituted for lean red meat in the diet. Poultry should not be fried or covered with high-fat sauces. Fish and Shellfish Fish is a good source of protein. Shellfish contain cholesterol, but they usually are low in saturated fatty acids. The preparation of fish is important. Like chicken and [place], they should not be fried or covered with high-fat sauces. EGGS Egg whites contain no fat or cholesterol. They can be eaten often. Try 1 to 2 egg whites instead of whole eggs in recipes or use egg substitutes that do not contain yolk. MILK AND DAIRY PRODUCTS Use skim or 1% milk instead of 2% or whole milk. Decrease whole milk, natural, and processed cheeses. Use nonfat or low-fat (2%) cottage cheese or low-fat cheeses made from vegetable oils. Choose nonfat or low-fat (1 to 2%) yogurt. Experiment with evaporated skim milk in recipes that call for heavy cream. Substitute low-fat yogurt or low-fat cottage cheese for sour cream in dips and salad dressings. Have at least 2 servings of low-fat dairy products, such as 2 glasses of skim (or 1%) milk each day to help get your daily calcium intake.  FATS AND OILS Reduce the total intake of fats, especially saturated fat. Butterfat, lard, and beef fats are high in saturated fat and cholesterol. These should be avoided as much as possible. Vegetable fats do not contain  cholesterol, but certain vegetable fats, such as coconut oil, palm oil, and palm kernel oil are very high in saturated fats. These should be limited. These fats are often used in bakery goods, processed foods, popcorn, oils, and nondairy creamers. Vegetable shortenings and some peanut butters contain hydrogenated oils, which are also saturated fats. Read the labels on these foods and check for saturated vegetable oils. Unsaturated vegetable oils and fats do not raise blood cholesterol. However, they should be limited because they are fats and are high in calories. Total fat should still be limited to 30% of your daily caloric intake. Desirable liquid vegetable oils are corn oil, cottonseed oil, olive oil, canola oil, safflower oil, soybean oil, and sunflower oil. Peanut  oil is not as good, but small amounts are acceptable. Buy a heart-healthy tub margarine that has no partially hydrogenated oils in the ingredients. Mayonnaise and salad dressings often are made from unsaturated fats, but they should also be limited because of their high calorie and fat content. Seeds, nuts, peanut butter, olives, and avocados are high in fat, but the fat is mainly the unsaturated type. These foods should be limited mainly to avoid excess calories and fat. OTHER EATING TIPS Snacks  Most sweets should be limited as snacks. They tend to be rich in calories and fats, and their caloric content outweighs their nutritional value. Some good choices in snacks are graham crackers, melba toast, soda crackers, bagels (no egg), English muffins, fruits, and vegetables. These snacks are preferable to snack crackers, [place] fries, and chips. Popcorn should be air-popped or cooked in small amounts of liquid vegetable oil. Desserts Eat fruit, low-fat yogurt, and fruit ices. AVOID pastries, cake, and cookies. Sherbet, angel food cake, gelatin dessert, frozen low-fat yogurt, or other frozen products that do not contain saturated fat (pure fruit  juice bars, frozen ice pops) are also acceptable.  COOKING METHODS Choose those methods that use little or no fat. They include: Poaching.  Braising.  Steaming.  Grilling.  Baking.  Stir-frying.  Broiling.  Microwaving.  Foods can be cooked in a nonstick pan without added fat, or use a nonfat cooking spray in regular cookware. Limit fried foods and avoid frying in saturated fat. Add moisture to lean meats by using water, broth, cooking wines, and other nonfat or low-fat sauces along with the cooking methods mentioned above. Soups and stews should be chilled after cooking. The fat that forms on top after a few hours in the refrigerator should be skimmed off. When preparing meals, avoid using excess salt. Salt can contribute to raising blood pressure in some people. EATING AWAY FROM HOME Order entres, potatoes, and vegetables without sauces or butter. When meat exceeds the size of a deck of cards (3 to 4 ounces), the rest can be taken home for another meal. Choose vegetable or fruit salads and ask for low-calorie salad dressings to be served on the side. Use dressings sparingly. Limit high-fat toppings, such as bacon, crumbled eggs, cheese, sunflower seeds, and olives. Ask for heart-healthy tub margarine instead of butter.   Diverticulosis Diverticulosis is a common condition that develops when small pouches (diverticula) form in the wall of the colon. The risk of diverticulosis increases with age. It happens more often in people who eat a low-fiber diet. Most individuals with diverticulosis have no symptoms. Those individuals with symptoms usually experience belly (abdominal) pain, constipation, or loose stools (diarrhea).  HOME CARE INSTRUCTIONS  Increase the amount of fiber in your diet as directed by your caregiver or dietician. This may reduce symptoms of diverticulosis.   Drink at least 6 to 8 glasses of water each day to prevent constipation.   Try not to strain when you have a bowel  movement.   Avoiding nuts and seeds to prevent complications is NOT NECESSARY.    FOODS HAVING HIGH FIBER CONTENT INCLUDE:  Fruits. Apple, peach, pear, tangerine, raisins, prunes.   Vegetables. Brussels sprouts, asparagus, broccoli, cabbage, carrot, cauliflower, romaine lettuce, spinach, summer squash, tomato, winter squash, zucchini.   Starchy Vegetables. Baked beans, kidney beans, lima beans, split peas, lentils, potatoes (with skin).   Grains. Whole wheat bread, brown rice, bran flake cereal, plain oatmeal, white rice, shredded wheat, bran muffins.   Polyps, Colon  A polyp is extra tissue that grows inside your body. Colon polyps grow in the large intestine. The large intestine, also called the colon, is part of your digestive system. It is a long, hollow tube at the end of your digestive tract where your body makes and stores stool. Most polyps are not dangerous. They are benign. This means they are not cancerous. But over time, some types of polyps can turn into cancer. Polyps that are smaller than a pea are usually not harmful. But larger polyps could someday become or may already be cancerous. To be safe, doctors remove all polyps and test them.   PREVENTION There is not one sure way to prevent polyps. You might be able to lower your risk of getting them if you:  Eat more fruits and vegetables and less fatty food.   Do not smoke.   Avoid alcohol.   Exercise every day.   Lose weight if you are overweight.   Eating more calcium and folate can also lower your risk of getting polyps. Some foods that are rich in calcium are milk, cheese, and broccoli. Some foods that are rich in folate are chickpeas, kidney beans, and spinach.   PATIENT INSTRUCTIONS POST-ANESTHESIA  IMMEDIATELY FOLLOWING SURGERY:  Do not drive or operate machinery for the first twenty four hours after surgery.  Do not make any important decisions for twenty four hours after surgery or while taking narcotic pain  medications or sedatives.  If you develop intractable nausea and vomiting or a severe headache please notify your doctor immediately.  FOLLOW-UP:  Please make an appointment with your surgeon as instructed. You do not need to follow up with anesthesia unless specifically instructed to do so.  WOUND CARE INSTRUCTIONS (if applicable):  Keep a dry clean dressing on the anesthesia/puncture wound site if there is drainage.  Once the wound has quit draining you may leave it open to air.  Generally you should leave the bandage intact for twenty four hours unless there is drainage.  If the epidural site drains for more than 36-48 hours please call the anesthesia department.  QUESTIONS?:  Please feel free to call your physician or the hospital operator if you have any questions, and they will be happy to assist you.

## 2016-01-05 ENCOUNTER — Encounter (HOSPITAL_COMMUNITY): Payer: Self-pay | Admitting: Gastroenterology

## 2016-01-07 IMAGING — US US ABDOMEN COMPLETE
1 series · 13 of 25 positions shown · non-contrast
Comparison: 09/19/2015

CLINICAL DATA: Alcoholic cirrhosis

EXAM:
ULTRASOUND ABDOMEN COMPLETE

[Series 1: us abdomen complete · 0.21mm/px · 13 of 86 slices shown]
[im 1/86]
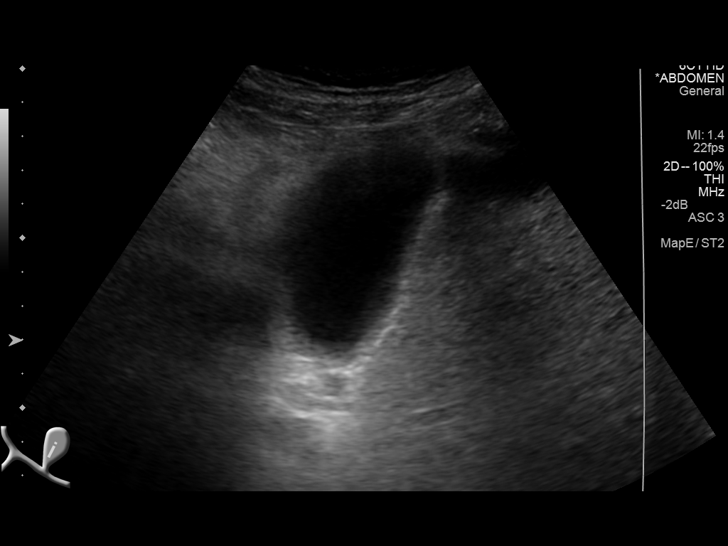
[im 8/86]
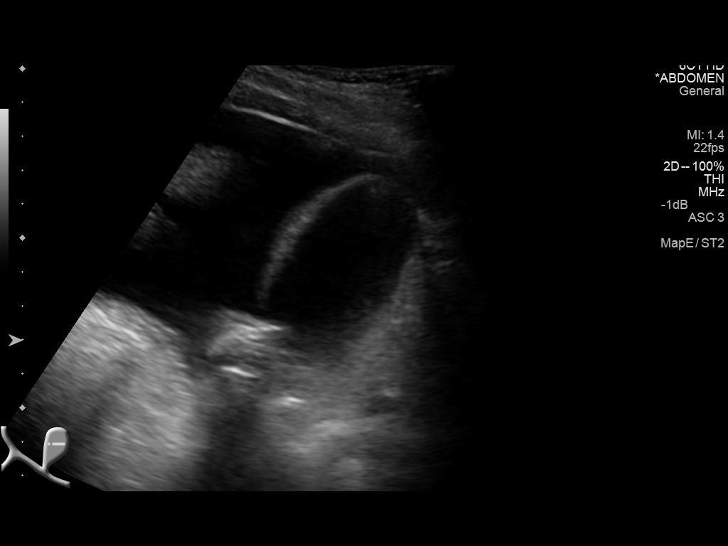
[im 15/86]
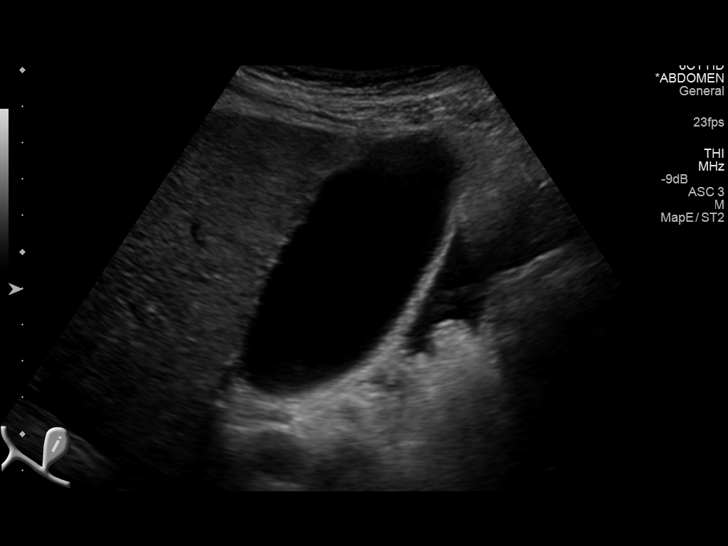
[im 22/86]
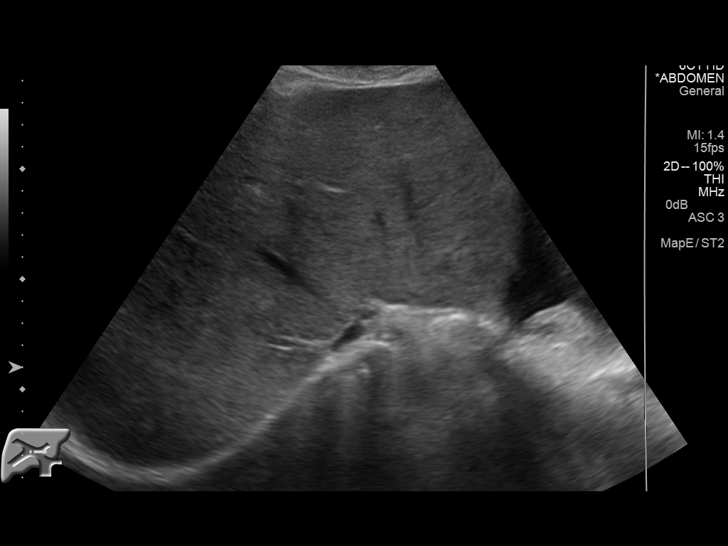
[im 29/86]
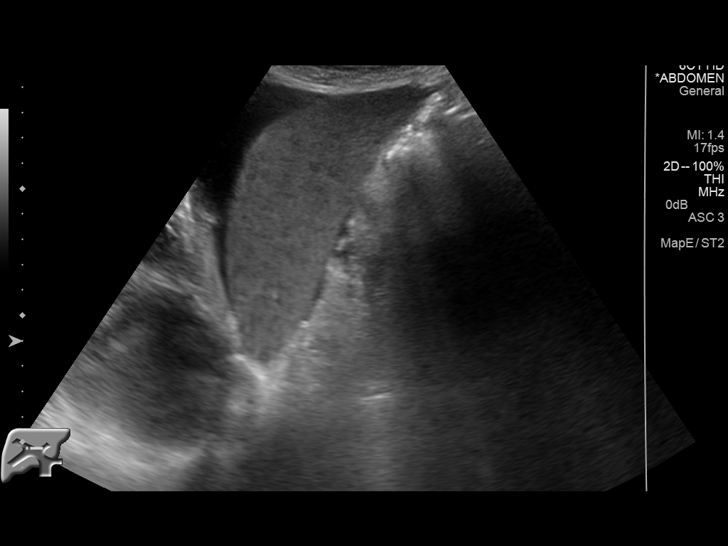
[im 36/86]
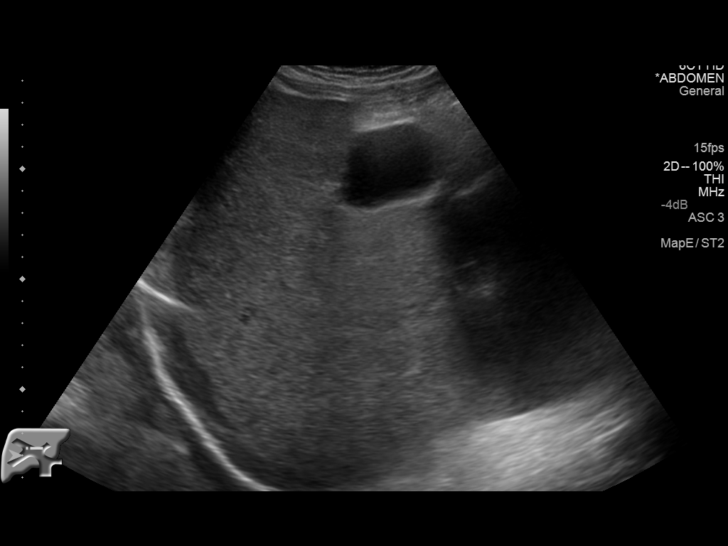
[im 43/86]
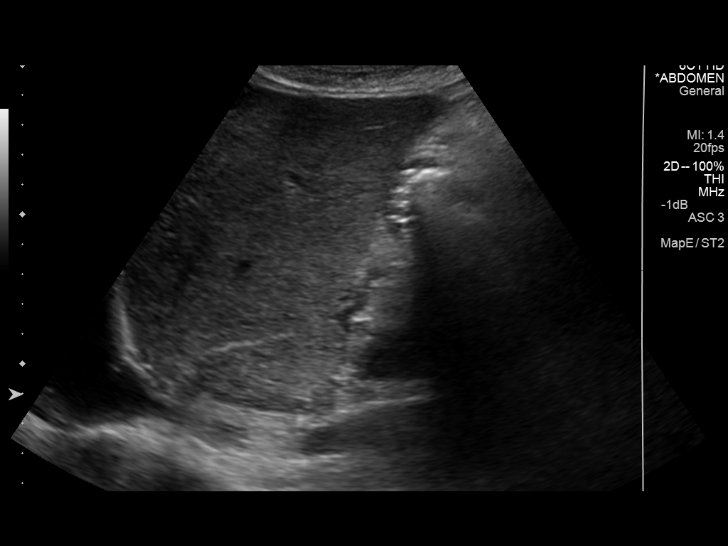
[im 50/86]
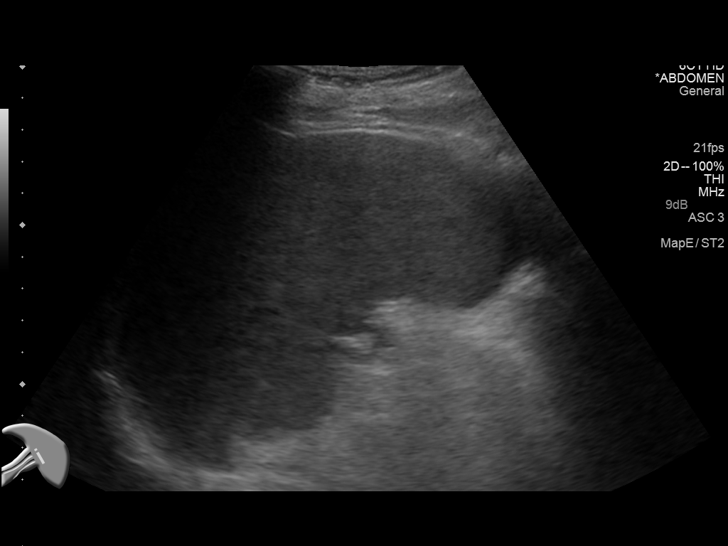
[im 57/86]
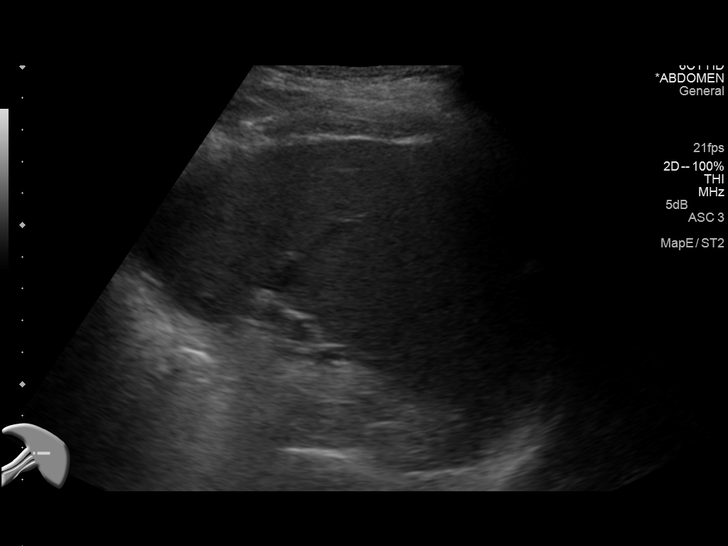
[im 64/86]
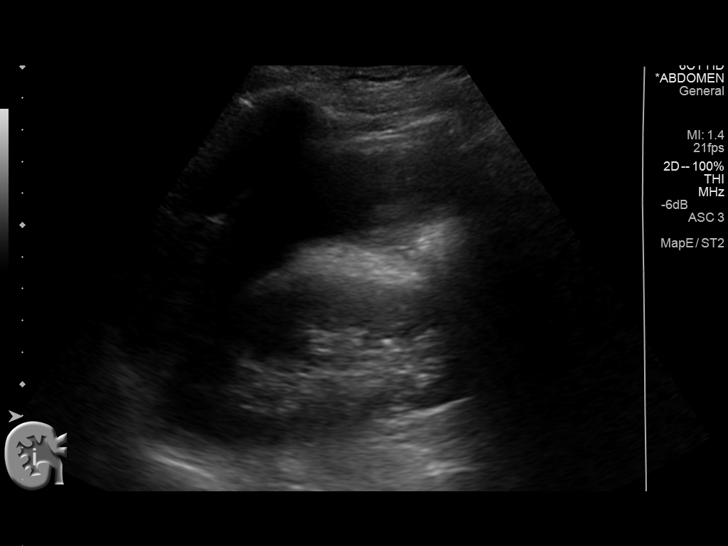
[im 71/86]
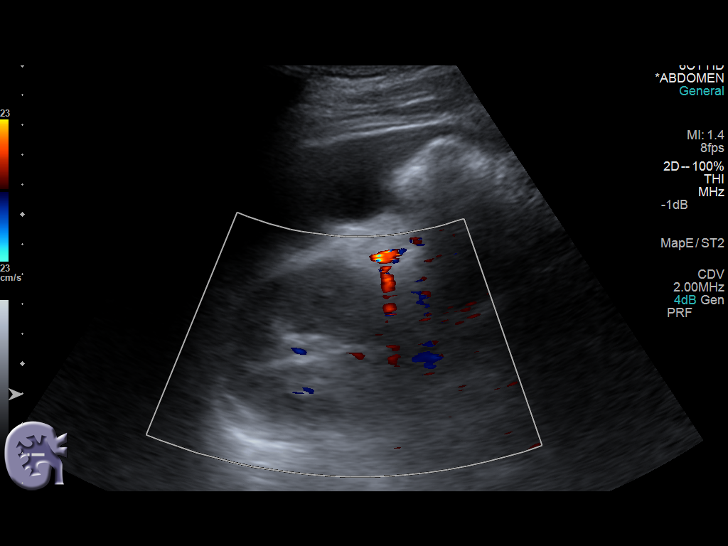
[im 78/86]
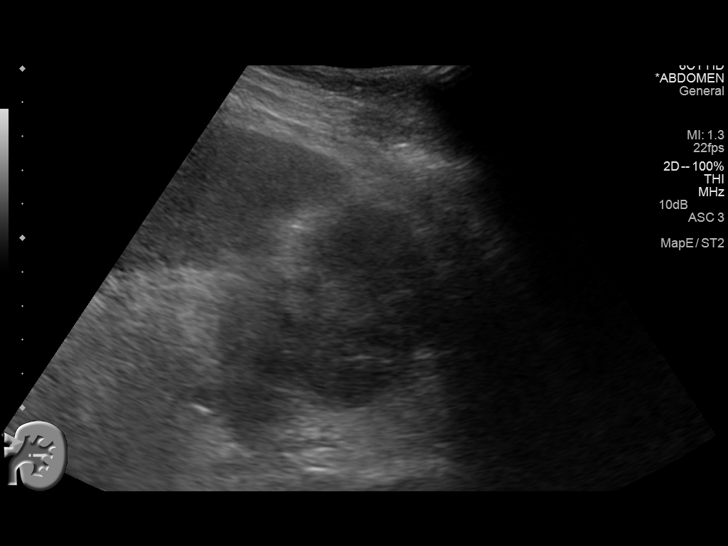
[im 86/86]
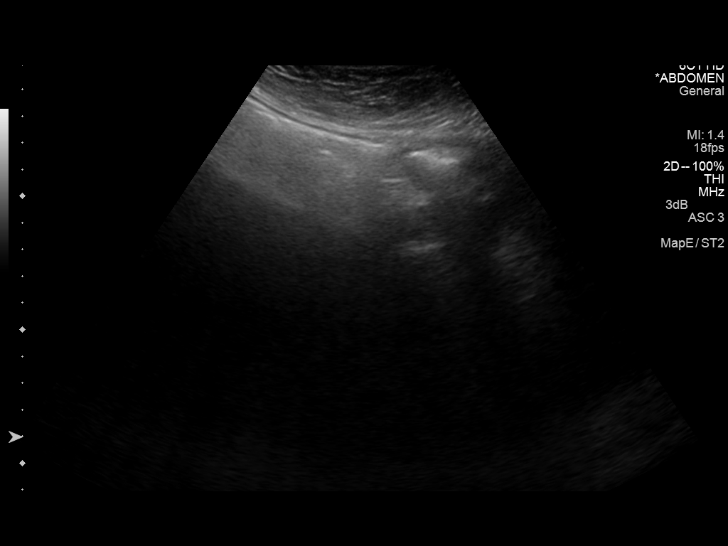

[13 of 25 positions shown; findings below may reference images not displayed]

FINDINGS: Gallbladder: No gallstones or wall thickening visualized. No
sonographic Murphy sign noted.

Common bile duct: Diameter: 5 mm in diameter within normal limits.

Liver: No focal hepatic mass. Again noted nodular liver with
increased echogenicity consistent with cirrhosis

IVC: No abnormality visualized.

Pancreas: Limited visualization due to bowel gas. Small amount of
perihepatic ascites.

Spleen: Spleen measures 10 cm in length.  Splenic volume 321 cc.

Right Kidney: Length: 10.5 cm. Echogenicity within normal limits. No
mass or hydronephrosis visualized.

Left Kidney: Length: 11.5 cm. Echogenicity within normal limits. No
mass or hydronephrosis visualized.

Abdominal aorta: No aneurysm visualized. Measures up to 1.8 cm in
diameter.

Other findings: Small right pleural effusion.
IMPRESSION: 1. No gallstones are noted within gallbladder.  Normal CBD.
2. Again noted nodular liver with increased echogenicity consistent
with cirrhosis. No focal hepatic mass.
3. No hydronephrosis.
4. No aortic aneurysm.
5. Small amount of perihepatic ascites. Small right pleural effusion

## 2016-01-12 ENCOUNTER — Telehealth: Payer: Self-pay | Admitting: Gastroenterology

## 2016-01-12 MED FILL — FUROSEMIDE 40 MG TABLET: 40 | 30 days supply | Qty: 30 | Fill #3

## 2016-01-12 MED FILL — ?SPIRONOLACTONE 50 MG TABLE: 50 | 30 days supply | Qty: 60 | Fill #4

## 2016-01-12 MED FILL — NADOLOL 20 MG TABLET: 20 | 30 days supply | Qty: 30 | Fill #3

## 2016-01-12 MED FILL — ALL DAY ALLERGY 10 MG TAB: 10 | 30 days supply | Qty: 30 | Fill #5

## 2016-01-12 NOTE — Telephone Encounter (Signed)
Please call pt. 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 FOLLOW A HIGH FIBER DIET. TCS IN 5 YEARS.

## 2016-01-12 NOTE — Telephone Encounter (Signed)
Called pt LMOM to call office back 

## 2016-01-12 NOTE — Telephone Encounter (Signed)
Reminder in epic °

## 2016-01-13 NOTE — Telephone Encounter (Signed)
Pt is aware of results. 

## 2016-01-17 ENCOUNTER — Other Ambulatory Visit: Payer: Self-pay

## 2016-01-17 DIAGNOSIS — R7989 Other specified abnormal findings of blood chemistry: Secondary | ICD-10-CM

## 2016-01-17 DIAGNOSIS — E871 Hypo-osmolality and hyponatremia: Secondary | ICD-10-CM

## 2016-01-17 NOTE — Progress Notes (Signed)
Quick Note:  LMOM to call. Lab order has been entered and released for [facility]. ______

## 2016-01-17 NOTE — Progress Notes (Signed)
Quick Note:  Ferritin elevated, sats percentage greater than 40, iron elevated. Concern for iron overload. Need to check hemochromatosis labs. He also has a positive ASMA. Transaminases remaining normal, alk phos elevated. He ultimately may need a liver biopsy, but I will need to discuss with Dr. Oneida Alar. ______

## 2016-01-18 NOTE — Progress Notes (Signed)
Quick Note:  Pt is aware and will go to the lab. ______

## 2016-01-20 ENCOUNTER — Telehealth: Payer: Self-pay | Admitting: Gastroenterology

## 2016-01-20 NOTE — Telephone Encounter (Signed)
PT SEEN AND DIURETIC DOUBLED. NEEDS REPEAT BMP MAR 8. PLEASE CAL PT AND LET HIM KNOW.

## 2016-01-20 NOTE — Telephone Encounter (Signed)
[facility] called wanting to get a message to [facility] or nurse. I told her SF was not in the office and her nurse was not in today and the other nurses were not available. I offered to send SF a page to call Dr. Olena Mater ??? And was told not to bother him and she refused to go into nurse's VM. She said it was about patient's medications and wanting to double patient's diuretic medications. Please call [phone] or the doctor's cell is [phone]

## 2016-01-20 NOTE — Telephone Encounter (Signed)
Tried to call pt- NA- voicemail not working.

## 2016-01-23 ENCOUNTER — Other Ambulatory Visit: Payer: Self-pay

## 2016-01-23 ENCOUNTER — Telehealth: Payer: Self-pay

## 2016-01-23 DIAGNOSIS — R7989 Other specified abnormal findings of blood chemistry: Secondary | ICD-10-CM

## 2016-01-23 NOTE — Telephone Encounter (Signed)
PLEASE CALL PT. He needs to take 2 ALDACTONE IN THE MORNING WITH LASIX 40 MG AND REPEAT THE SAME THING IN THE EVENING.

## 2016-01-23 NOTE — Telephone Encounter (Signed)
Lab order has been entered for [facility].

## 2016-01-23 NOTE — Telephone Encounter (Signed)
I called and LMOM for a return call.

## 2016-01-23 NOTE — Telephone Encounter (Signed)
Pt said he is taking Lasix 40 mg ( 2 in the morning for 80 mg). Michela Pitcher he is taking Aldactone 50 mg tablets #4 for 200 mg.  He takes them all at once in the Am.  So he needs to take 1/2 in the AM and 1/2 in the PM?

## 2016-01-23 NOTE — Telephone Encounter (Signed)
See separate note, I have talked to pt.

## 2016-01-23 NOTE — Telephone Encounter (Signed)
Pt was returning a call from this morning. Please call [phone]

## 2016-01-23 NOTE — Telephone Encounter (Signed)
Pt is aware he needs labs on Wed and he will go to [facility]. He said his Lasix was increased from 40 mg qd to 80 mg daily. His Aldactone is 50 mg and he is taking 4 qd. He takes them all in the Am.  He said he had two more prescriptions and he wanted to know if he is to take them also. He spelled out the Furosemide and the Spironolactone and I told him that is the same that he is taking.  He is aware to wait on them until he needs refills.

## 2016-01-23 NOTE — Telephone Encounter (Signed)
Pt said he will take Alcactone 50 mg   ( 2 tablets in the morning with Lasix 40 mg and also the same in the eveining.

## 2016-01-24 NOTE — Telephone Encounter (Signed)
REVIEWED-NO ADDITIONAL RECOMMENDATIONS. 

## 2016-01-25 ENCOUNTER — Telehealth: Payer: Self-pay | Admitting: Gastroenterology

## 2016-01-25 NOTE — Telephone Encounter (Signed)
LMOM for a return call.  

## 2016-01-25 NOTE — Telephone Encounter (Signed)
Recommend [facility]. We need to know how he is tolerating the diuretics. See if he can get on a payment plan here would be the best option. Do we have records from [facility]?

## 2016-01-25 NOTE — Telephone Encounter (Signed)
Anna, please advise in Dr. Field's absence.

## 2016-01-25 NOTE — Telephone Encounter (Signed)
Linda from [facility] called to say that the patient has a past due balance and their rules are not to draw anything on patients if there is a past due balance. She explained to the patient and wanted [place] to know the situation. She did say that there is a [facility] in [place] that may be closer to him since he lives in [place] and she put his lab order on hold.

## 2016-01-26 NOTE — Telephone Encounter (Signed)
Pt called back and said he has OV at [facility] on 01/30/2016 and said he could get his lab drawn there.   I have faxed the order to them @ [phone].   He has been working on plan with [facility] and just not got it worked out.   Sending to Manuela Schwartz to check on the records from [facility].

## 2016-01-26 NOTE — Telephone Encounter (Signed)
PT called. Kevin Nicholson he is doing well on his diuretics. He said he has gotten his blood drawn before from [facility] even though he thinks it is processed through [facility]. He is going to check with them and if they say they can draw it he will call me back with a fax number.

## 2016-01-26 NOTE — Telephone Encounter (Signed)
I have mailed patient a release of information to sign and mail back to me. What records do you need from [facility]?

## 2016-01-27 ENCOUNTER — Telehealth: Payer: Self-pay | Admitting: Family Medicine

## 2016-01-27 ENCOUNTER — Other Ambulatory Visit: Payer: Self-pay

## 2016-01-27 DIAGNOSIS — K7031 Alcoholic cirrhosis of liver with ascites: Secondary | ICD-10-CM

## 2016-01-27 DIAGNOSIS — R188 Other ascites: Secondary | ICD-10-CM

## 2016-01-27 NOTE — Telephone Encounter (Signed)
Please call patient  In anticipation of his appt with Dr. Oneida Alar on 01/30/16, he needs to come in for [facility]

## 2016-01-27 NOTE — Telephone Encounter (Signed)
Pt is out of this medication due to [facility] increase the dose

## 2016-01-27 NOTE — Telephone Encounter (Signed)
Patient was referred there for eval of liver transplant I believe. I need any office notes.

## 2016-01-29 MED ORDER — SPIRONOLACTONE 100 MG PO TABS: 100.0000 mg | ORAL_TABLET | Freq: Two times a day (BID) | ORAL | Status: DC

## 2016-01-29 MED ORDER — FUROSEMIDE 40 MG PO TABS: ORAL_TABLET | ORAL | Status: DC

## 2016-01-30 ENCOUNTER — Ambulatory Visit: Payer: Medicaid Other | Attending: Family Medicine

## 2016-01-30 DIAGNOSIS — K7031 Alcoholic cirrhosis of liver with ascites: Secondary | ICD-10-CM | POA: Insufficient documentation

## 2016-01-30 LAB — COMPLETE METABOLIC PANEL WITH GFR
ALBUMIN: 3.6 g/dL (ref 3.6–5.1)
ALK PHOS: 147 U/L — AB (ref 40–115)
ALT: 14 U/L (ref 9–46)
AST: 19 U/L (ref 10–35)
BILIRUBIN TOTAL: 1.3 mg/dL — AB (ref 0.2–1.2)
BUN: 23 mg/dL (ref 7–25)
CALCIUM: 9.1 mg/dL (ref 8.6–10.3)
CO2: 31 mmol/L (ref 20–31)
Chloride: 97 mmol/L — ABNORMAL LOW (ref 98–110)
Creat: 1 mg/dL (ref 0.70–1.33)
GFR, EST NON AFRICAN AMERICAN: 84 mL/min (ref >=60)
GLUCOSE: 88 mg/dL (ref 65–99)
Potassium: 4.5 mmol/L (ref 3.5–5.3)
Sodium: 134 mmol/L — ABNORMAL LOW (ref 135–146)
TOTAL PROTEIN: 7 g/dL (ref 6.1–8.1)

## 2016-01-30 NOTE — Telephone Encounter (Signed)
I have mailed patient a release of records and he is to mail back to me.

## 2016-01-30 NOTE — Telephone Encounter (Signed)
Pt came today for lab CMP

## 2016-01-30 NOTE — Telephone Encounter (Signed)
Since we referred him there, they should be able to send [place] info now, right? Without a ROI?

## 2016-01-31 MED FILL — SPIRONOLACTONE 100 MG TAB: 100 | 30 days supply | Qty: 60 | Fill #0

## 2016-01-31 NOTE — Telephone Encounter (Signed)
I will try to get records today, but I need to locate where facility is at and get a fax number. CJ is trying to get a number for me to call.

## 2016-02-02 ENCOUNTER — Telehealth: Payer: Self-pay | Admitting: *Deleted

## 2016-02-02 NOTE — Telephone Encounter (Signed)
-----   Message from Boykin Nearing, MD sent at 01/31/2016  8:38 AM EDT ----- Slight low sodium and chloride consistent with diuretic use All other labs stable

## 2016-02-02 NOTE — Telephone Encounter (Signed)
LVM to return call.

## 2016-02-14 NOTE — Progress Notes (Signed)
Quick Note:  Has he done hemochromatosis labs? ______

## 2016-02-14 NOTE — Progress Notes (Signed)
Quick Note:  LMOM to call. ______ 

## 2016-02-15 NOTE — Progress Notes (Signed)
Quick Note:  Pt said he had the lab done at [facility].  He will check with them to fax the result. ______

## 2016-02-16 MED FILL — OMEPRAZOLE DR 20 MG CAPSULE: 20 | 30 days supply | Qty: 60 | Fill #3

## 2016-02-16 MED FILL — NADOLOL 20 MG TABLET: 20 | 30 days supply | Qty: 30 | Fill #4

## 2016-02-16 MED FILL — ALL DAY ALLERGY 10 MG TAB: 10 | 30 days supply | Qty: 30 | Fill #6

## 2016-02-16 NOTE — Telephone Encounter (Signed)
I have been trying to get records from a transplant evaluation. It is unclear where he was referred to? Since we referred him, we do not need him to sign a release. It's important that we figure out where he was referred, what they said. I also need hemochromatosis labs that were done with [facility]. Thanks!

## 2016-02-16 NOTE — Telephone Encounter (Signed)
Thanks so much. 

## 2016-02-16 NOTE — Telephone Encounter (Signed)
Records were in [facility] box. I put them in your chair.

## 2016-02-17 NOTE — Telephone Encounter (Signed)
Reviewed documentation from [facility]. Saw Dr. Charlie Pitter. No need for liver transplant currently, as we knew. However, he had his diuretics adjusted. He will follow up with them in 3-4 months. Awaiting hemochromatosis labs.

## 2016-02-21 ENCOUNTER — Telehealth: Payer: Self-pay | Admitting: Family Medicine

## 2016-02-21 DIAGNOSIS — K7031 Alcoholic cirrhosis of liver with ascites: Secondary | ICD-10-CM

## 2016-02-21 NOTE — Telephone Encounter (Signed)
Please call patient Hemachromatosis labs was drawn at his last visit as I was unaware that it was needed The CMP was done He should return for hemachromatosis lab.

## 2016-02-29 MED FILL — ?FUROSEMIDE 40 MG TABLET: 40 | 30 days supply | Qty: 60 | Fill #0

## 2016-02-29 NOTE — Telephone Encounter (Signed)
I have faxed the [facility] again asking for the results of the Hemochromatosis labs.

## 2016-02-29 NOTE — Telephone Encounter (Signed)
Pt will be here tomorrow for Hemachromatosis lab

## 2016-03-01 ENCOUNTER — Ambulatory Visit: Payer: Medicaid Other | Attending: Family Medicine

## 2016-03-01 DIAGNOSIS — K7031 Alcoholic cirrhosis of liver with ascites: Secondary | ICD-10-CM | POA: Insufficient documentation

## 2016-03-01 NOTE — Patient Instructions (Signed)
Patient's here for lab visit only. 

## 2016-03-01 NOTE — Progress Notes (Signed)
Quick Note:  I refaxed them a request yesterday! ______

## 2016-03-04 IMAGING — US US PARACENTESIS
1 series · 5 of 5 positions shown · non-contrast
Comparison: none

CLINICAL DATA: Ascites

EXAM:
ULTRASOUND GUIDED PARACENTESIS
TECHNIQUE: Survey ultrasound of the abdomen was performed and an appropriate
skin entry site in the right lower abdomen was selected. Skin site
was marked, prepped with Betadine, and draped in usual sterile
fashion, and infiltrated locally with 1% lidocaine. A 5 French
multisidehole M Zahir Gharanai needle was advanced into the peritoneal
space until fluid could be aspirated. The sheath was advanced and
the needle removed. 4 L of clearascites were aspirated.
COMPLICATIONS:
None

[Series 1: us paracentesis · 0.27mm/px · 5 of 5 slices shown]
[im 1/5]
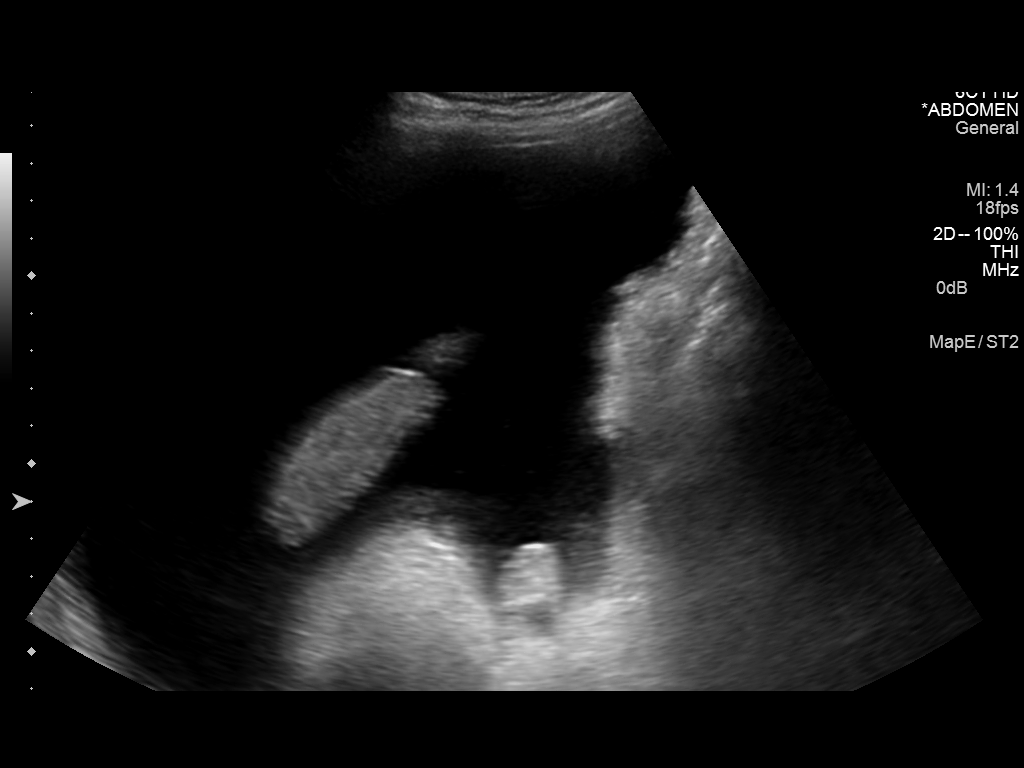
[im 2/5]
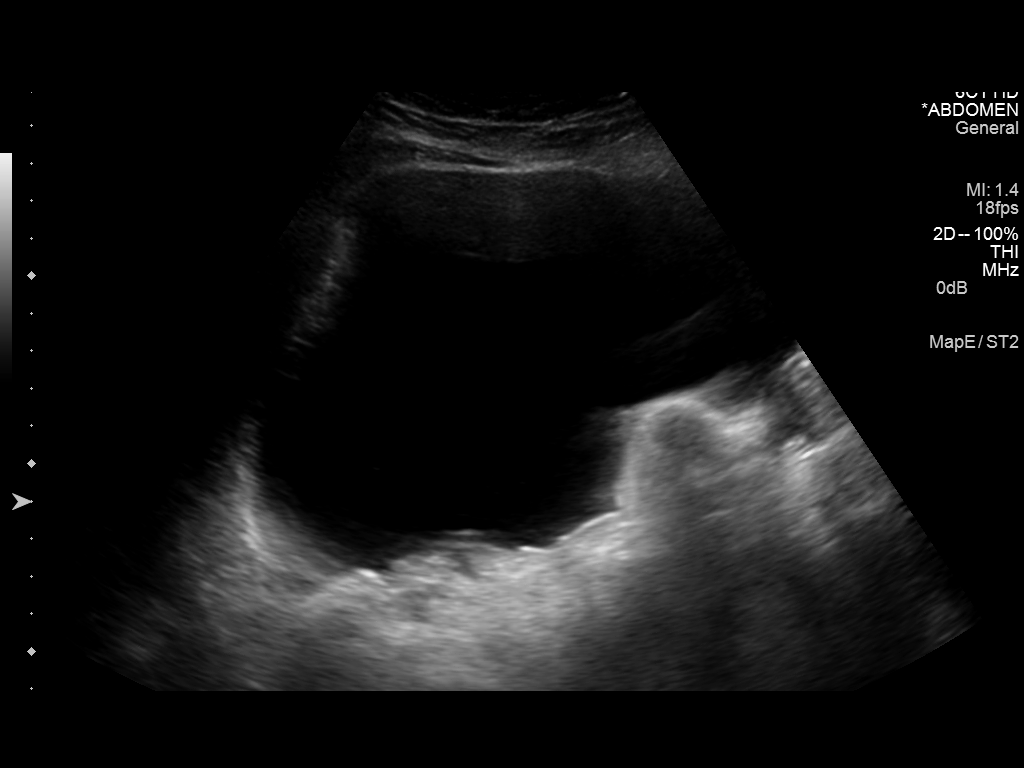
[im 3/5]
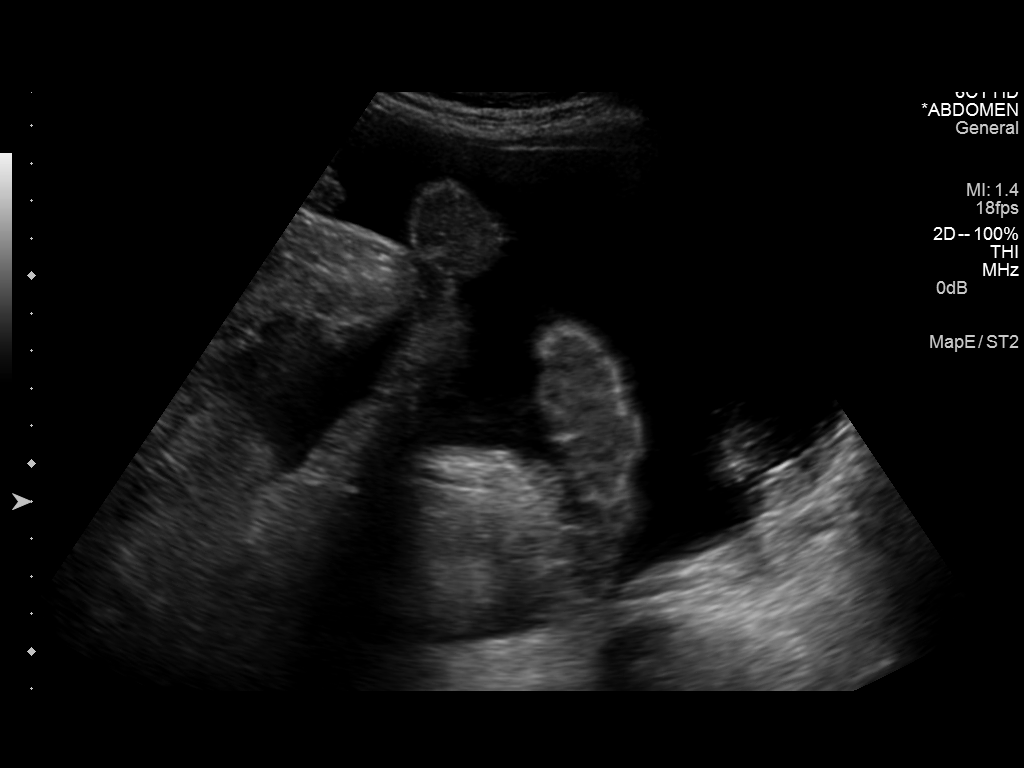
[im 4/5]
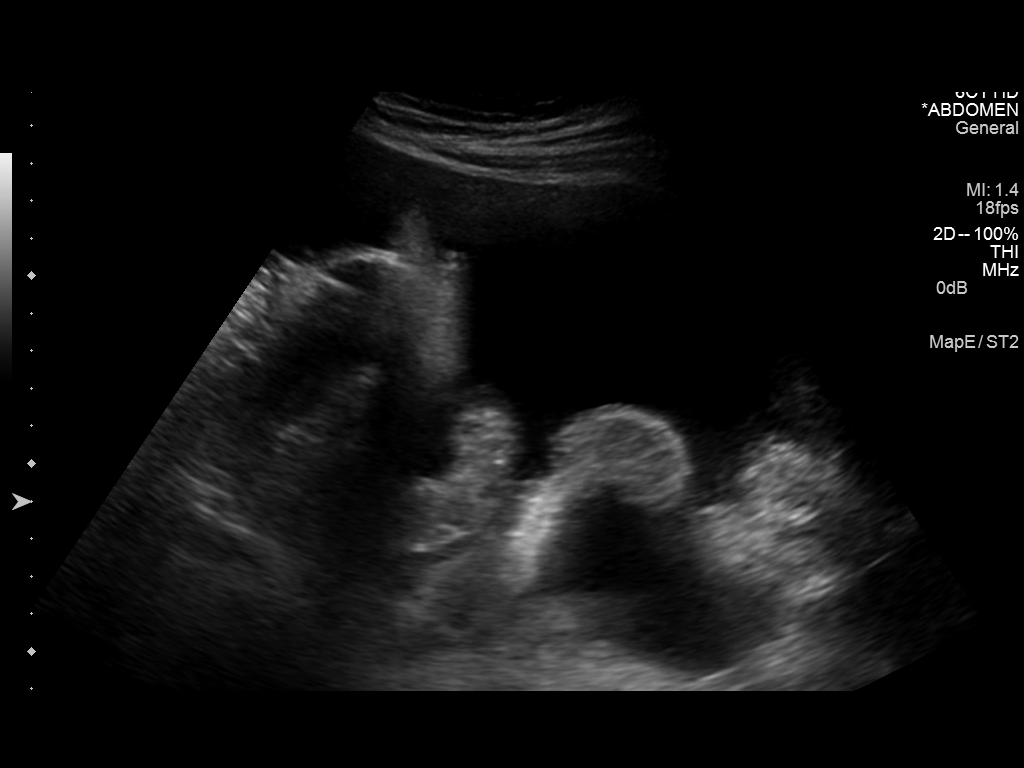
[im 5/5]
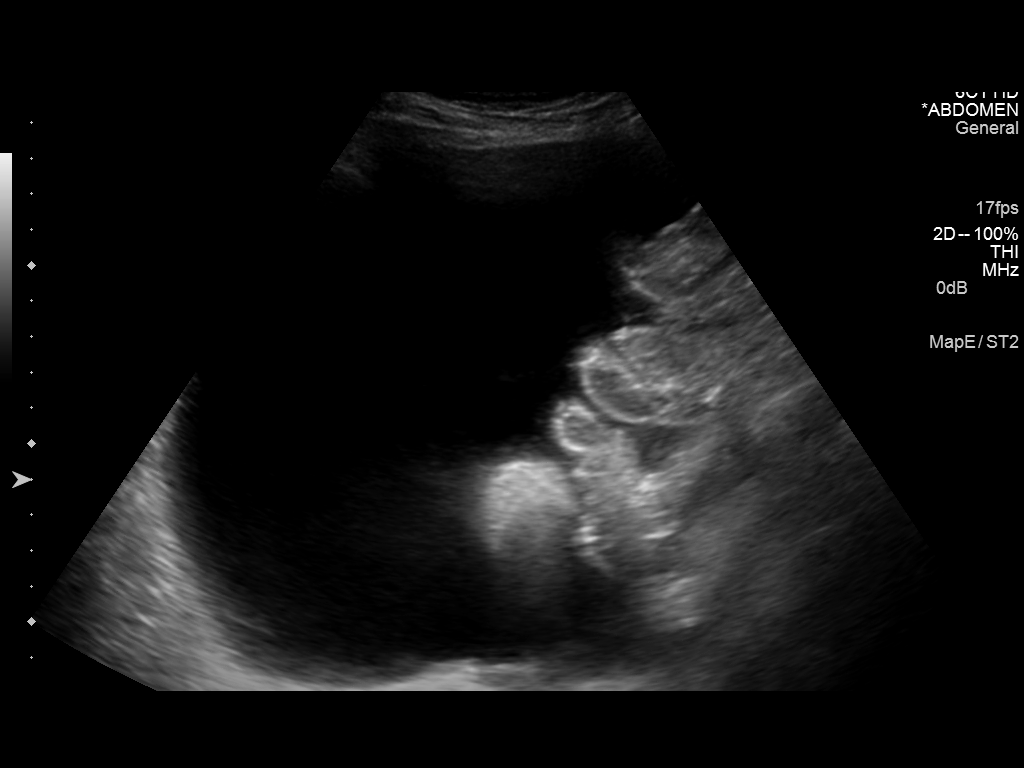

[5 of 5 positions shown; findings below may reference images not displayed]

IMPRESSION: Technically successful ultrasound guided paracentesis, removing 4 L
of ascites.

## 2016-03-06 ENCOUNTER — Telehealth: Payer: Self-pay | Admitting: Gastroenterology

## 2016-03-06 LAB — HEMOCHROMATOSIS DNA-PCR(C282Y,H63D)

## 2016-03-06 NOTE — Telephone Encounter (Signed)
Mailed letter °

## 2016-03-06 NOTE — Telephone Encounter (Signed)
Duplicate Letter mailed

## 2016-03-06 NOTE — Telephone Encounter (Signed)
RECALL FOR ULTRASOUND 

## 2016-03-07 ENCOUNTER — Telehealth: Payer: Self-pay | Admitting: *Deleted

## 2016-03-07 NOTE — Telephone Encounter (Signed)
LVM to return call.

## 2016-03-07 NOTE — Telephone Encounter (Signed)
-----   Message from Boykin Nearing, MD sent at 03/06/2016  4:15 PM EDT ----- Hemochromatosis negative Result forwarded to patient's GI doctor

## 2016-03-10 ENCOUNTER — Inpatient Hospital Stay (HOSPITAL_COMMUNITY)
Admission: EM | Admit: 2016-03-10 | Discharge: 2016-03-21 | DRG: 330 | Disposition: A | Discharge status: Home / Self Care | Payer: Medicaid Other | Attending: Internal Medicine | Admitting: Internal Medicine

## 2016-03-10 ENCOUNTER — Encounter (HOSPITAL_COMMUNITY): Payer: Self-pay

## 2016-03-10 ENCOUNTER — Emergency Department (HOSPITAL_COMMUNITY): Payer: Medicaid Other

## 2016-03-10 DIAGNOSIS — I8500 Esophageal varices without bleeding: Secondary | ICD-10-CM | POA: Diagnosis present

## 2016-03-10 DIAGNOSIS — K439 Ventral hernia without obstruction or gangrene: Secondary | ICD-10-CM | POA: Insufficient documentation

## 2016-03-10 DIAGNOSIS — K559 Vascular disorder of intestine, unspecified: Secondary | ICD-10-CM | POA: Diagnosis present

## 2016-03-10 DIAGNOSIS — K7031 Alcoholic cirrhosis of liver with ascites: Secondary | ICD-10-CM

## 2016-03-10 DIAGNOSIS — R109 Unspecified abdominal pain: Secondary | ICD-10-CM | POA: Diagnosis present

## 2016-03-10 DIAGNOSIS — Z8711 Personal history of peptic ulcer disease: Secondary | ICD-10-CM | POA: Diagnosis not present

## 2016-03-10 DIAGNOSIS — R112 Nausea with vomiting, unspecified: Secondary | ICD-10-CM

## 2016-03-10 DIAGNOSIS — F17200 Nicotine dependence, unspecified, uncomplicated: Secondary | ICD-10-CM | POA: Diagnosis present

## 2016-03-10 DIAGNOSIS — E440 Moderate protein-calorie malnutrition: Secondary | ICD-10-CM | POA: Diagnosis present

## 2016-03-10 DIAGNOSIS — I959 Hypotension, unspecified: Secondary | ICD-10-CM | POA: Diagnosis present

## 2016-03-10 DIAGNOSIS — Z809 Family history of malignant neoplasm, unspecified: Secondary | ICD-10-CM

## 2016-03-10 DIAGNOSIS — K5669 Other intestinal obstruction: Secondary | ICD-10-CM

## 2016-03-10 DIAGNOSIS — K567 Ileus, unspecified: Secondary | ICD-10-CM | POA: Diagnosis not present

## 2016-03-10 DIAGNOSIS — Z6827 Body mass index (BMI) 27.0-27.9, adult: Secondary | ICD-10-CM

## 2016-03-10 DIAGNOSIS — K56609 Unspecified intestinal obstruction, unspecified as to partial versus complete obstruction: Secondary | ICD-10-CM | POA: Diagnosis present

## 2016-03-10 DIAGNOSIS — J449 Chronic obstructive pulmonary disease, unspecified: Secondary | ICD-10-CM | POA: Diagnosis present

## 2016-03-10 DIAGNOSIS — Z79899 Other long term (current) drug therapy: Secondary | ICD-10-CM | POA: Diagnosis not present

## 2016-03-10 DIAGNOSIS — Z7951 Long term (current) use of inhaled steroids: Secondary | ICD-10-CM | POA: Diagnosis not present

## 2016-03-10 DIAGNOSIS — F17210 Nicotine dependence, cigarettes, uncomplicated: Secondary | ICD-10-CM | POA: Diagnosis present

## 2016-03-10 DIAGNOSIS — K420 Umbilical hernia with obstruction, without gangrene: Secondary | ICD-10-CM | POA: Diagnosis present

## 2016-03-10 DIAGNOSIS — K9189 Other postprocedural complications and disorders of digestive system: Secondary | ICD-10-CM | POA: Diagnosis not present

## 2016-03-10 DIAGNOSIS — R188 Other ascites: Secondary | ICD-10-CM

## 2016-03-10 HISTORY — DX: Ventral hernia without obstruction or gangrene: K43.9

## 2016-03-10 LAB — LIPASE, BLOOD: Lipase: 38 U/L (ref 11–51)

## 2016-03-10 LAB — CBC WITH DIFFERENTIAL/PLATELET
Basophils Absolute: 0 10*3/uL (ref 0.0–0.1)
Basophils Relative: 0 %
Eosinophils Absolute: 0 10*3/uL (ref 0.0–0.7)
Eosinophils Relative: 0 %
HEMATOCRIT: 44.7 % (ref 39.0–52.0)
Hemoglobin: 16.1 g/dL (ref 13.0–17.0)
LYMPHS PCT: 11 %
Lymphs Abs: 1 10*3/uL (ref 0.7–4.0)
MCH: 34.8 pg — ABNORMAL HIGH (ref 26.0–34.0)
MCHC: 36 g/dL (ref 30.0–36.0)
MCV: 96.5 fL (ref 78.0–100.0)
MONO ABS: 0.7 10*3/uL (ref 0.1–1.0)
MONOS PCT: 8 %
NEUTROS ABS: 7.3 10*3/uL (ref 1.7–7.7)
Neutrophils Relative %: 81 %
Platelets: 265 10*3/uL (ref 150–400)
RBC: 4.63 MIL/uL (ref 4.22–5.81)
RDW: 13.5 % (ref 11.5–15.5)
WBC: 9 10*3/uL (ref 4.0–10.5)

## 2016-03-10 LAB — URINALYSIS, ROUTINE W REFLEX MICROSCOPIC
BILIRUBIN URINE: NEGATIVE
GLUCOSE, UA: NEGATIVE mg/dL
Hgb urine dipstick: NEGATIVE
KETONES UR: NEGATIVE mg/dL
Leukocytes, UA: NEGATIVE
Nitrite: NEGATIVE
PH: 5.5 (ref 5.0–8.0)
Protein, ur: NEGATIVE mg/dL
Specific Gravity, Urine: 1.025 (ref 1.005–1.030)

## 2016-03-10 LAB — COMPREHENSIVE METABOLIC PANEL
ALBUMIN: 4.1 g/dL (ref 3.5–5.0)
ALT: 30 U/L (ref 17–63)
AST: 29 U/L (ref 15–41)
Alkaline Phosphatase: 144 U/L — ABNORMAL HIGH (ref 38–126)
Anion gap: 11 (ref 5–15)
BILIRUBIN TOTAL: 1.4 mg/dL — AB (ref 0.3–1.2)
BUN: 32 mg/dL — AB (ref 6–20)
CO2: 28 mmol/L (ref 22–32)
Calcium: 9.6 mg/dL (ref 8.9–10.3)
Chloride: 95 mmol/L — ABNORMAL LOW (ref 101–111)
Creatinine, Ser: 1.27 mg/dL — ABNORMAL HIGH (ref 0.61–1.24)
GFR calc Af Amer: >60 mL/min (ref >60)
GFR calc non Af Amer: >60 mL/min (ref >60)
GLUCOSE: 119 mg/dL — AB (ref 65–99)
POTASSIUM: 4.4 mmol/L (ref 3.5–5.1)
Sodium: 134 mmol/L — ABNORMAL LOW (ref 135–145)
TOTAL PROTEIN: 8.3 g/dL — AB (ref 6.5–8.1)

## 2016-03-10 MED ORDER — FENTANYL CITRATE (PF) 100 MCG/2ML IJ SOLN
50.0000 ug | Freq: Once | INTRAMUSCULAR | Status: AC
  Administered 2016-03-10: 50 ug via INTRAVENOUS
  Filled 2016-03-10: qty 2

## 2016-03-10 MED ORDER — DIATRIZOATE MEGLUMINE & SODIUM 66-10 % PO SOLN
15.0000 mL | Freq: Once | ORAL | Status: AC
  Administered 2016-03-10: 15 mL via ORAL

## 2016-03-10 MED ORDER — ONDANSETRON HCL 4 MG/2ML IJ SOLN
4.0000 mg | Freq: Four times a day (QID) | INTRAMUSCULAR | Status: DC | PRN
  Administered 2016-03-11 – 2016-03-16 (×6): 4 mg via INTRAVENOUS
  Filled 2016-03-10 (×6): qty 2

## 2016-03-10 MED ORDER — SODIUM CHLORIDE 0.9 % IV SOLN
INTRAVENOUS | Status: DC
  Administered 2016-03-10 – 2016-03-11 (×2): via INTRAVENOUS

## 2016-03-10 MED ORDER — SODIUM CHLORIDE 0.9 % IV SOLN
INTRAVENOUS | Status: DC
  Administered 2016-03-10 – 2016-03-13 (×6): via INTRAVENOUS
  Administered 2016-03-13 – 2016-03-14 (×2): 100 mL/h via INTRAVENOUS

## 2016-03-10 MED ORDER — ONDANSETRON HCL 4 MG/2ML IJ SOLN
4.0000 mg | Freq: Once | INTRAMUSCULAR | Status: AC
  Administered 2016-03-10: 4 mg via INTRAVENOUS
  Filled 2016-03-10: qty 2

## 2016-03-10 MED ORDER — LIDOCAINE HCL 2 % EX GEL
1.0000 "application " | Freq: Once | CUTANEOUS | Status: AC
  Administered 2016-03-10: 1
  Filled 2016-03-10: qty 11

## 2016-03-10 MED ORDER — HYDROMORPHONE HCL 1 MG/ML IJ SOLN
0.5000 mg | INTRAMUSCULAR | Status: DC | PRN
  Administered 2016-03-10 – 2016-03-11 (×2): 0.5 mg via INTRAVENOUS
  Administered 2016-03-12 – 2016-03-15 (×4): 1 mg via INTRAVENOUS
  Administered 2016-03-16: 0.5 mg via INTRAVENOUS
  Administered 2016-03-16 – 2016-03-20 (×13): 1 mg via INTRAVENOUS
  Filled 2016-03-10 (×21): qty 1

## 2016-03-10 NOTE — ED Notes (Signed)
Pt cannot use restroom at this time, aware urine specimen is needed.  

## 2016-03-10 NOTE — ED Notes (Signed)
Pt is adamantly refusing NG tube.  Dr. Vanita Panda is aware.

## 2016-03-10 NOTE — ED Provider Notes (Signed)
CSN: [id number]     Arrival date & time 03/10/16  1516 History   First MD Initiated Contact with Patient 03/10/16 1527     Chief Complaint  Patient presents with  . Abdominal Pain     (Consider location/radiation/quality/duration/timing/severity/associated sxs/prior Treatment) HPI Patient presents with concern of nausea, vomiting, abdominal pain, loose stool. This began today, about 9 hours ago. Patient was well prior to the event, though he has a history of cirrhosis, has monthly paracentesis. Since onset pain has been persistent, severe, focally in the anterior abdomen. Pain is nonradiating, sharp. Patient has a known abdominal wall hernia.  No clear exacerbating or relieving factors.  Past Medical History  Diagnosis Date  . Ascites   . Cirrhosis ([place])     varices  . Shortness of breath dyspnea     with stair climbing  . Ventral hernia    Past Surgical History  Procedure Laterality Date  . Back surgery  2000  . 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 Esophagogastroduodenoscopy (egd) with propofol N/A 09/13/2015    Dr. 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 +H.pylori . Surveillance due in Jan 2017 for ulcer healing  . Biopsy N/A 09/13/2015    Procedure: BIOPSY;  Surgeon: Danie Binder, MD;  Location: [facility];  Service: Endoscopy;  Laterality: N/A;  gastric,   . Esophagogastroduodenoscopy (egd) with propofol N/A 01/03/2016    Procedure: ESOPHAGOGASTRODUODENOSCOPY (EGD) WITH PROPOFOL;  Surgeon: Danie Binder, MD;  Location: [facility];  Service: Endoscopy;  Laterality: N/A;  0845  . Colonoscopy with propofol N/A 01/03/2016    Procedure: COLONOSCOPY WITH PROPOFOL;  Surgeon: Danie Binder, MD;  Location: [facility];  Service: Endoscopy;  Laterality: N/A;  . Polypectomy  01/03/2016    Procedure: POLYPECTOMY;  Surgeon: Danie Binder, MD;  Location: [facility];  Service: Endoscopy;;  transverse colon  polyps x2, sigmoid colon polyps x4, rectal polyps x5  . Biopsy  01/03/2016    Procedure: BIOPSY;  Surgeon: Danie Binder, MD;  Location: [facility];  Service: Endoscopy;;  gastric bx's   Family History  Problem Relation Age of Onset  . Cancer Mother   . Cancer Father   . Colon cancer Neg Hx   . Liver disease Neg Hx    Social History  Substance Use Topics  . Smoking status: Current Every Day Smoker -- 1.00 packs/day for 45 years    Types: Cigarettes  . Smokeless tobacco: None  . 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  Endocrine:       Negative aside from HPI  Genitourinary:       Neg aside from HPI   Musculoskeletal:       Per HPI, otherwise negative  Skin: Negative.   Allergic/Immunologic: Positive for immunocompromised state.  Neurological: Positive for weakness. Negative for syncope.      Allergies  Review of patient's allergies indicates no known allergies.  Home Medications   Prior  to Admission medications   Medication Sig Start Date End Date Taking? Authorizing Provider  albuterol (PROVENTIL HFA;VENTOLIN HFA) 108 (90 BASE) MCG/ACT inhaler Inhale 2 puffs into the lungs every 6 (six) hours as needed for wheezing or shortness of breath. 08/09/15  Yes Josalyn Funches, MD  bismuth subsalicylate (PEPTO BISMOL) 262 MG/15ML suspension Take 30 mLs by mouth once as needed (abdominal pain.).   Yes Historical Provider, MD  cetirizine (ZYRTEC) 10 MG tablet Take 1 tablet (10 mg total) by mouth daily. 08/23/15  Yes Josalyn Funches, MD  fluticasone (FLONASE) 50 MCG/ACT nasal spray Place 2 sprays into both nostrils daily. 08/09/15  Yes Josalyn  Funches, MD  furosemide (LASIX) 40 MG tablet Take 40 mg BID 01/29/16  Yes Mahala Menghini, PA-C  lactulose (CHRONULAC) 10 GM/15ML solution Take 15 mLs (10 g total) by mouth 2 (two) times daily as needed for mild constipation. 08/09/15  Yes Josalyn Funches, MD  nadolol (CORGARD) 20 MG tablet Take 1 tablet (20 mg total) by mouth daily. 10/20/15  Yes Orvil Feil, NP  omeprazole (PRILOSEC) 20 MG capsule 1 po bid 30 minutes before meals for 3 mos then once daily FOREVER 09/13/15  Yes Danie Binder, MD  Phenyleph-CPM-DM-APAP (ALKA-SELTZER PLUS COLD & FLU) 03-20-09-250 MG TBEF Take 1 tablet by mouth once as needed (abdominal pain.).   Yes Historical Provider, MD  spironolactone (ALDACTONE) 100 MG tablet Take 1 tablet (100 mg total) by mouth 2 (two) times daily. 01/29/16  Yes Mahala Menghini, PA-C   BP 137/76 mmHg  Pulse 56  Temp(Src) 97.8 F (36.6 C) (Oral)  Resp 18  SpO2 99% Physical Exam  Constitutional: He is oriented to person, place, and time. He appears well-developed. No distress.  HENT:  Head: Normocephalic and atraumatic.  Eyes: Conjunctivae and EOM are normal.  Cardiovascular: Normal rate and regular rhythm.   Pulmonary/Chest: Effort normal. No stridor. No respiratory distress.  Abdominal: He exhibits no distension.    Musculoskeletal: He exhibits no edema.  Neurological: He is alert and oriented to person, place, and time.  Skin: Skin is warm and dry.  Psychiatric: He has a normal mood and affect.  Nursing note and vitals reviewed.   ED Course  Procedures (including critical care time) Labs Review Labs Reviewed  COMPREHENSIVE METABOLIC PANEL - Abnormal; Notable for the following:    Sodium 134 (*)    Chloride 95 (*)    Glucose, Bld 119 (*)    BUN 32 (*)    Creatinine, Ser 1.27 (*)    Total Protein 8.3 (*)    Alkaline Phosphatase 144 (*)    Total Bilirubin 1.4 (*)    All other components within normal limits  CBC WITH DIFFERENTIAL/PLATELET - Abnormal; Notable for the  following:    MCH 34.8 (*)    All other components within normal limits  URINALYSIS, ROUTINE W REFLEX MICROSCOPIC (NOT AT [facility]) - Abnormal; Notable for the following:    Color, Urine AMBER (*)    All other components within normal limits  LIPASE, BLOOD  CBC  COMPREHENSIVE METABOLIC PANEL    Imaging Review Ct Abdomen Pelvis Wo Contrast  03/10/2016  CLINICAL DATA:  Abdominal pain, onset today. Nausea and vomiting. Ventral hernia, question incarcerated. EXAM: CT ABDOMEN AND PELVIS WITHOUT CONTRAST TECHNIQUE: Multidetector CT imaging of the abdomen and pelvis was performed following the standard protocol without IV contrast. COMPARISON:  Most recent CT 04/13/2015 FINDINGS: Lower chest: The included lung bases are clear. No pleural effusion. Normal  heart size. Liver: Nodular contours consistent with cirrhosis. No evidence of focal lesion allowing for lack contrast. Hepatobiliary: Gallbladder physiologically distended, no calcified stone. No biliary dilatation. Pancreas: No ductal dilatation.  No peripancreatic inflammation. Spleen: Upper normal in size.  Scattered granuloma. Adrenal glands: No nodule. Kidneys: No hydronephrosis or urolithiasis. Kidneys symmetric in size. No perinephric stranding. Stomach/Bowel: Stomach distended with ingested contrast. Small amount contrast in the distal esophagus. Dilated fluid-filled small bowel loops with transition point umbilical hernia. Exiting and distal small bowel loops are decompressed. No pneumatosis. Small volume stool throughout the colon. Distal colonic diverticulosis, no evidence of diverticulitis. Appendix is normal. Vascular/Lymphatic: No retroperitoneal adenopathy. Abdominal aorta is normal in caliber. Moderate atherosclerosis without aneurysm. Reproductive: Prostate gland normal in size. Bladder: Physiologically distended. Other: Moderate volume intra-abdominal and pelvic ascites. Minimal ascites in the umbilical hernia. No free air. No definite loculated  fluid collection. Musculoskeletal: There are no acute or suspicious osseous abnormalities. IMPRESSION: 1. Small-bowel obstruction secondary to incarcerated umbilical hernia. 2. Cirrhosis with moderate volume intra-abdominal pelvic ascites. Electronically Signed   By: Jeb Levering M.D.   On: 03/10/2016 18:09   I have personally reviewed and evaluated these images and lab results as part of my medical decision-making. Patient did not tolerate several attempts at NG tube placement. I discussed his case with our general surgery colleagues, hospitalist team for admission.  On repeat exam the patient remains in similar condition, mild abdominal pain, general discomfort, no distress. Again I was unable to reduce the umbilical hernia. MDM   Final diagnoses:  Incarcerated umbilical hernia  SBO (small bowel obstruction) ([place])   She presents with ongoing abdominal pain, nausea, vomiting, and given the immediately noticeable abdominal wall hernia, suspicion for incarceration. This was demonstrated on CT scan. After discussion with both her surgical team, and general medicine team, the  patient was admitted for further evaluation and management.  Carmin Muskrat, MD 03/11/16 0002

## 2016-03-10 NOTE — Consult Note (Signed)
Reason for Consult:sbo, ventral hernia Referring Physician: Dr Yehuda Savannah is an 55 y.o. male.  HPI: Patient presents with nausea, vomiting, abdominal pain, loose stool that started earlier today.  He has a history of alcoholic cirrhosis, has monthly paracentesis and is on diuretics.  Since onset pain has been persistent, nonradiating, sharp. Patient has a known abdominal wall umbilical hernia.  Past Medical History  Diagnosis Date  . Ascites   . Cirrhosis ([place])     varices  . Shortness of breath dyspnea     with stair climbing  . Ventral hernia     Past Surgical History  Procedure Laterality Date  . Back surgery  2000  . 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 Esophagogastroduodenoscopy (egd) with propofol N/A 09/13/2015    Dr. 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 +H.pylori . Surveillance due in Jan 2017 for ulcer healing  . Biopsy N/A 09/13/2015    Procedure: BIOPSY;  Surgeon: Danie Binder, MD;  Location: [facility];  Service: Endoscopy;  Laterality: N/A;  gastric,   . Esophagogastroduodenoscopy (egd) with propofol N/A 01/03/2016    Procedure: ESOPHAGOGASTRODUODENOSCOPY (EGD) WITH PROPOFOL;  Surgeon: Danie Binder, MD;  Location: [facility];  Service: Endoscopy;  Laterality: N/A;  0845  . Colonoscopy with propofol N/A 01/03/2016    Procedure: COLONOSCOPY WITH PROPOFOL;  Surgeon: Danie Binder, MD;  Location: [facility];  Service: Endoscopy;  Laterality: N/A;  . Polypectomy  01/03/2016    Procedure: POLYPECTOMY;  Surgeon: Danie Binder, MD;  Location: [facility];  Service: Endoscopy;;  transverse colon polyps x2, sigmoid colon polyps x4, rectal polyps x5  . Biopsy  01/03/2016    Procedure: BIOPSY;  Surgeon: Danie Binder, MD;  Location: [facility];  Service: Endoscopy;;  gastric bx's    Family History  Problem Relation Age of Onset  . Cancer Mother   . Cancer Father   . Colon  cancer Neg Hx   . Liver disease Neg Hx     Social History:  reports that he has been smoking Cigarettes.  He has a 45 pack-year smoking history. He does not have any smokeless tobacco history on file. He reports that he does not drink alcohol or use illicit drugs.  Allergies: No Known Allergies  Medications: I have reviewed the patient's current medications.  Results for orders placed or performed during the hospital encounter of 03/10/16 (from the past 48 hour(s))  Comprehensive metabolic panel     Status: Abnormal   Collection Time: 03/10/16  3:53 PM  Result Value Ref Range   Sodium 134 (L) 135 - 145 mmol/L   Potassium 4.4 3.5 - 5.1 mmol/L   Chloride 95 (L) 101 - 111 mmol/L   CO2 28 22 - 32 mmol/L   Glucose, Bld 119 (H) 65 - 99 mg/dL   BUN 32 (H) 6 - 20 mg/dL   Creatinine, Ser 1.27 (H) 0.61 - 1.24 mg/dL   Calcium 9.6 8.9 - 10.3 mg/dL   Total Protein 8.3 (H) 6.5 - 8.1 g/dL   Albumin 4.1 3.5 - 5.0 g/dL   AST 29 15 - 41 U/L   ALT 30 17 - 63 U/L   Alkaline Phosphatase 144 (H) 38 - 126 U/L   Total Bilirubin 1.4 (H) 0.3 - 1.2 mg/dL   GFR calc non Af Amer >60 >60 mL/min  GFR calc Af Amer >60 >60 mL/min    Comment: (NOTE) The eGFR has been calculated using the CKD EPI equation. This calculation has not been validated in all clinical situations. eGFR's persistently <60 mL/min signify possible Chronic Kidney Disease.    Anion gap 11 5 - 15  Lipase, blood     Status: None   Collection Time: 03/10/16  3:53 PM  Result Value Ref Range   Lipase 38 11 - 51 U/L  CBC WITH DIFFERENTIAL     Status: Abnormal   Collection Time: 03/10/16  3:53 PM  Result Value Ref Range   WBC 9.0 4.0 - 10.5 K/uL   RBC 4.63 4.22 - 5.81 MIL/uL   Hemoglobin 16.1 13.0 - 17.0 g/dL   HCT 44.7 39.0 - 52.0 %   MCV 96.5 78.0 - 100.0 fL   MCH 34.8 (H) 26.0 - 34.0 pg   MCHC 36.0 30.0 - 36.0 g/dL   RDW 13.5 11.5 - 15.5 %   Platelets 265 150 - 400 K/uL   Neutrophils Relative % 81 %   Neutro Abs 7.3 1.7 - 7.7  K/uL   Lymphocytes Relative 11 %   Lymphs Abs 1.0 0.7 - 4.0 K/uL   Monocytes Relative 8 %   Monocytes Absolute 0.7 0.1 - 1.0 K/uL   Eosinophils Relative 0 %   Eosinophils Absolute 0.0 0.0 - 0.7 K/uL   Basophils Relative 0 %   Basophils Absolute 0.0 0.0 - 0.1 K/uL    Ct Abdomen Pelvis Wo Contrast  03/10/2016  CLINICAL DATA:  Abdominal pain, onset today. Nausea and vomiting. Ventral hernia, question incarcerated. EXAM: CT ABDOMEN AND PELVIS WITHOUT CONTRAST TECHNIQUE: Multidetector CT imaging of the abdomen and pelvis was performed following the standard protocol without IV contrast. COMPARISON:  Most recent CT 04/13/2015 FINDINGS: Lower chest: The included lung bases are clear. No pleural effusion. Normal heart size. Liver: Nodular contours consistent with cirrhosis. No evidence of focal lesion allowing for lack contrast. Hepatobiliary: Gallbladder physiologically distended, no calcified stone. No biliary dilatation. Pancreas: No ductal dilatation.  No peripancreatic inflammation. Spleen: Upper normal in size.  Scattered granuloma. Adrenal glands: No nodule. Kidneys: No hydronephrosis or urolithiasis. Kidneys symmetric in size. No perinephric stranding. Stomach/Bowel: Stomach distended with ingested contrast. Small amount contrast in the distal esophagus. Dilated fluid-filled small bowel loops with transition point umbilical hernia. Exiting and distal small bowel loops are decompressed. No pneumatosis. Small volume stool throughout the colon. Distal colonic diverticulosis, no evidence of diverticulitis. Appendix is normal. Vascular/Lymphatic: No retroperitoneal adenopathy. Abdominal aorta is normal in caliber. Moderate atherosclerosis without aneurysm. Reproductive: Prostate gland normal in size. Bladder: Physiologically distended. Other: Moderate volume intra-abdominal and pelvic ascites. Minimal ascites in the umbilical hernia. No free air. No definite loculated fluid collection. Musculoskeletal: There  are no acute or suspicious osseous abnormalities. IMPRESSION: 1. Small-bowel obstruction secondary to incarcerated umbilical hernia. 2. Cirrhosis with moderate volume intra-abdominal pelvic ascites. Electronically Signed   By: Jeb Levering M.D.   On: 03/10/2016 18:09    Review of Systems  Constitutional: Negative for fever and chills.  Eyes: Negative for blurred vision.  Respiratory: Negative for cough and shortness of breath.   Cardiovascular: Negative for chest pain.  Gastrointestinal: Positive for nausea, vomiting, abdominal pain and diarrhea.  Genitourinary: Negative for dysuria, urgency and frequency.  Musculoskeletal: Negative for myalgias.  Skin: Negative for rash.  Neurological: Negative for headaches.   Blood pressure 130/78, pulse 61, temperature 97.5 F (36.4 C), temperature source Oral, resp. rate  18, SpO2 96 %. Physical Exam  Constitutional: He is oriented to person, place, and time. He appears well-developed and well-nourished.  HENT:  Head: Normocephalic and atraumatic.  Eyes: Conjunctivae and EOM are normal. Pupils are equal, round, and reactive to light.  Neck: Normal range of motion. Neck supple.  Cardiovascular: Normal rate and regular rhythm.   Respiratory: Effort normal and breath sounds normal. No respiratory distress.  GI: Soft. He exhibits mass. He exhibits no distension. There is tenderness.  Incarcerated umbilical hernia, no signs of necrosis  Musculoskeletal: Normal range of motion.  Neurological: He is alert and oriented to person, place, and time.  Skin: Skin is warm and dry.    Assessment/Plan: 55 y.o. M with esld and incarcerated ventral hernia causing bowel obstruction.  Pt would be a poor candidate for surgery and would recommend any possible medical management to reduce his abd pressure and allow hernia to be reduced.  He also must have ng placed tonight to decompress his bowel internally.  Pt has a high chance of morbidity and even mortality with  surgical management.    Devyn Sheerin C. 3/74/8270, 7:86 PM

## 2016-03-10 NOTE — H&P (Signed)
History and Physical    Kevin Nicholson [id number] DOB: 1960-11-30 DOA: 03/10/2016  Referring MD/NP/PA: Dr. Vanita Panda PCP: Minerva Ends, MD Outpatient Specialists: Dr. Barney Drain with GI Patient coming from: Home  Chief Complaint: ABD pain  HPI: Kevin Nicholson is a 55 y.o. male with medical history significant of cirrhosis of the liver with ascites and grade 1 esophageal varicies.  Patient is followed by Dr. Oneida Alar with GI.  Cirrhosis is believe to occur in the context of prior EtOH use in the past (non drinking now).  He has monthly paracentesis for ascites, otherwise is actually healthy previously.  Patient presents to the ED with c/o N/V, abdominal pain, loose stool.  Symptoms onset 9 hours ago.  Pain is persistent, severe, focally located in anterior abdomen.  Non-radiating and sharp.  ED Course: Patient's known hernia is found to be painful, hard, non-reducible.  CT scan confirms obstruction due to incarcerated umbilical hernia.  Surgery is coming in to consult on the patient, medicine is asked to admit.  Review of Systems: As per HPI otherwise 10 point review of systems negative.    Past Medical History  Diagnosis Date  . Ascites   . Cirrhosis ([place])     varices  . Shortness of breath dyspnea     with stair climbing  . Ventral hernia     Past Surgical History  Procedure Laterality Date  . Back surgery  2000  . 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 Esophagogastroduodenoscopy (egd) with propofol N/A 09/13/2015    Dr. 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 +H.pylori . Surveillance due in Jan 2017 for ulcer healing  . Biopsy N/A 09/13/2015    Procedure: BIOPSY;  Surgeon: Danie Binder, MD;  Location: [facility];  Service: Endoscopy;  Laterality: N/A;  gastric,   . Esophagogastroduodenoscopy (egd) with propofol N/A 01/03/2016    Procedure: ESOPHAGOGASTRODUODENOSCOPY (EGD) WITH PROPOFOL;  Surgeon:  Danie Binder, MD;  [facility];  Service: Endoscopy;  Laterality: N/A;  0845  . Colonoscopy with propofol N/A 01/03/2016    Procedure: COLONOSCOPY WITH PROPOFOL;  Surgeon: Danie Binder, MD;  [facility];  Service: Endoscopy;  Laterality: N/A;  . Polypectomy  01/03/2016    Procedure: POLYPECTOMY;  Surgeon: Danie Binder, MD;  [facility];  Service: Endoscopy;;  transverse colon polyps x2, sigmoid colon polyps x4, rectal polyps x5  . Biopsy  01/03/2016    Procedure: BIOPSY;  Surgeon: Danie Binder, MD;  Location: [facility];  Service: Endoscopy;;  gastric bx's     reports that he has been smoking Cigarettes.  He has a 45 pack-year smoking history. He does not have any smokeless tobacco history on file. He reports that he does not drink alcohol or use illicit drugs.  No Known Allergies  Family History  Problem Relation Age of Onset  . Cancer Mother   . Cancer Father   . Colon cancer Neg Hx   . Liver disease Neg Hx      Prior to Admission medications   Medication Sig Start Date End Date Taking? Authorizing Provider  albuterol (PROVENTIL HFA;VENTOLIN HFA) 108 (90 BASE) MCG/ACT inhaler Inhale 2 puffs into the lungs every 6 (six) hours as needed for wheezing or shortness of breath. 08/09/15  Yes Josalyn Funches, MD  bismuth subsalicylate (PEPTO BISMOL) 262 MG/15ML suspension Take 30 mLs  by mouth once as needed (abdominal pain.).   Yes Historical Provider, MD  cetirizine (ZYRTEC) 10 MG tablet Take 1 tablet (10 mg total) by mouth daily. 08/23/15  Yes Josalyn Funches, MD  fluticasone (FLONASE) 50 MCG/ACT nasal spray Place 2 sprays into both nostrils daily. 08/09/15  Yes Josalyn Funches, MD  furosemide (LASIX) 40 MG tablet Take 40 mg BID 01/29/16  Yes Mahala Menghini, PA-C  lactulose (CHRONULAC) 10 GM/15ML solution Take 15 mLs (10 g total) by mouth 2 (two) times daily as needed for mild constipation. 08/09/15  Yes Josalyn Funches, MD  nadolol (CORGARD) 20 MG  tablet Take 1 tablet (20 mg total) by mouth daily. 10/20/15  Yes Orvil Feil, NP  omeprazole (PRILOSEC) 20 MG capsule 1 po bid 30 minutes before meals for 3 mos then once daily FOREVER 09/13/15  Yes Danie Binder, MD  Phenyleph-CPM-DM-APAP (ALKA-SELTZER PLUS COLD & FLU) 03-20-09-250 MG TBEF Take 1 tablet by mouth once as needed (abdominal pain.).   Yes Historical Provider, MD  spironolactone (ALDACTONE) 100 MG tablet Take 1 tablet (100 mg total) by mouth 2 (two) times daily. 01/29/16  Yes Mahala Menghini, PA-C    Physical Exam: Filed Vitals:   03/10/16 1525 03/10/16 1824  BP: 137/76 130/78  Pulse: 56 61  Temp: 97.8 F (36.6 C) 97.5 F (36.4 C)  TempSrc: Oral Oral  Resp: 18 18  SpO2: 99% 96%      Constitutional: NAD, calm, comfortable Filed Vitals:   03/10/16 1525 03/10/16 1824  BP: 137/76 130/78  Pulse: 56 61  Temp: 97.8 F (36.6 C) 97.5 F (36.4 C)  TempSrc: Oral Oral  Resp: 18 18  SpO2: 99% 96%   Eyes: PERRL, lids and conjunctivae normal ENMT: Mucous membranes are moist. Posterior pharynx clear of any exudate or lesions.Normal dentition.  Neck: normal, supple, no masses, no thyromegaly Respiratory: clear to auscultation bilaterally, no wheezing, no crackles. Normal respiratory effort. No accessory muscle use.  Cardiovascular: Regular rate and rhythm, no murmurs / rubs / gallops. No extremity edema. 2+ pedal pulses. No carotid bruits.  Abdomen: Non-reducible, tender hernia at umbilicus. Ascites, Bowel sounds positive.  Musculoskeletal: no clubbing / cyanosis. No joint deformity upper and lower extremities. Good ROM, no contractures. Normal muscle tone.  Skin: no rashes, lesions, ulcers. No induration Neurologic: CN 2-12 grossly intact. Sensation intact, DTR normal. Strength 5/5 in all 4.  Psychiatric: Normal judgment and insight. Alert and oriented x 3. Normal mood.    Labs on Admission: I have personally reviewed following labs and imaging studies  CBC:  Recent  Labs Lab 03/10/16 1553  WBC 9.0  NEUTROABS 7.3  HGB 16.1  HCT 44.7  MCV 96.5  PLT 99991111   Basic Metabolic Panel:  Recent Labs Lab 03/10/16 1553  NA 134*  K 4.4  CL 95*  CO2 28  GLUCOSE 119*  BUN 32*  CREATININE 1.27*  CALCIUM 9.6   GFR: CrCl cannot be calculated (Unknown ideal weight.). Liver Function Tests:  Recent Labs Lab 03/10/16 1553  AST 29  ALT 30  ALKPHOS 144*  BILITOT 1.4*  PROT 8.3*  ALBUMIN 4.1    Recent Labs Lab 03/10/16 1553  LIPASE 38   No results for input(s): AMMONIA in the last 168 hours. Coagulation Profile: No results for input(s): INR, PROTIME in the last 168 hours. Cardiac Enzymes: No results for input(s): CKTOTAL, CKMB, CKMBINDEX, TROPONINI in the last 168 hours. BNP (last 3 results) No results for input(s): PROBNP in the  last 8760 hours. HbA1C: No results for input(s): HGBA1C in the last 72 hours. CBG: No results for input(s): GLUCAP in the last 168 hours. Lipid Profile: No results for input(s): CHOL, HDL, LDLCALC, TRIG, CHOLHDL, LDLDIRECT in the last 72 hours. Thyroid Function Tests: No results for input(s): TSH, T4TOTAL, FREET4, T3FREE, THYROIDAB in the last 72 hours. Anemia Panel: No results for input(s): VITAMINB12, FOLATE, FERRITIN, TIBC, IRON, RETICCTPCT in the last 72 hours. Urine analysis: No results found for: COLORURINE, APPEARANCEUR, LABSPEC, PHURINE, GLUCOSEU, HGBUR, BILIRUBINUR, KETONESUR, PROTEINUR, UROBILINOGEN, NITRITE, LEUKOCYTESUR Sepsis Labs: @LABRCNTIP (procalcitonin:4,lacticidven:4) )No results found for this or any previous visit (from the past 240 hour(s)).   Radiological Exams on Admission: Ct Abdomen Pelvis Wo Contrast  03/10/2016  CLINICAL DATA:  Abdominal pain, onset today. Nausea and vomiting. Ventral hernia, question incarcerated. EXAM: CT ABDOMEN AND PELVIS WITHOUT CONTRAST TECHNIQUE: Multidetector CT imaging of the abdomen and pelvis was performed following the standard protocol without IV  contrast. COMPARISON:  Most recent CT 04/13/2015 FINDINGS: Lower chest: The included lung bases are clear. No pleural effusion. Normal heart size. Liver: Nodular contours consistent with cirrhosis. No evidence of focal lesion allowing for lack contrast. Hepatobiliary: Gallbladder physiologically distended, no calcified stone. No biliary dilatation. Pancreas: No ductal dilatation.  No peripancreatic inflammation. Spleen: Upper normal in size.  Scattered granuloma. Adrenal glands: No nodule. Kidneys: No hydronephrosis or urolithiasis. Kidneys symmetric in size. No perinephric stranding. Stomach/Bowel: Stomach distended with ingested contrast. Small amount contrast in the distal esophagus. Dilated fluid-filled small bowel loops with transition point umbilical hernia. Exiting and distal small bowel loops are decompressed. No pneumatosis. Small volume stool throughout the colon. Distal colonic diverticulosis, no evidence of diverticulitis. Appendix is normal. Vascular/Lymphatic: No retroperitoneal adenopathy. Abdominal aorta is normal in caliber. Moderate atherosclerosis without aneurysm. Reproductive: Prostate gland normal in size. Bladder: Physiologically distended. Other: Moderate volume intra-abdominal and pelvic ascites. Minimal ascites in the umbilical hernia. No free air. No definite loculated fluid collection. Musculoskeletal: There are no acute or suspicious osseous abnormalities. IMPRESSION: 1. Small-bowel obstruction secondary to incarcerated umbilical hernia. 2. Cirrhosis with moderate volume intra-abdominal pelvic ascites. Electronically Signed   By: Jeb Levering M.D.   On: 03/10/2016 18:09    EKG: Independently reviewed.  Assessment/Plan Principal Problem:   SBO (small bowel obstruction) ([facility]) Active Problems:   Alcoholic cirrhosis of liver with ascites ([facility])   Incarcerated umbilical hernia  ([facility]) Active Problems:   Alcoholic cirrhosis of liver with ascites ([place])   Incarcerated umbilical  hernia  Incarcerated umbilical hernia causing SBO -  NGT  NPO  IVF  Pain control with dilaudid  Surgery coming to evaluate.  Alcoholic cirrhosis of liver -  MELD score of 14  Child class B due to moderate ascites  While patient would be at increased risk compared to a healthy patient for abdominal surgery based on his cirrhosis.  If the hernia cannot be reduced and obstruction resolved then there isnt much choice but to repair surgically.  Additionally if hernia and obstruction is likely to re-occur (which seems likely), then surgery may also be worth the risk.   DVT prophylaxis: SCDs Code Status: Full Family Communication: No family in room Consults called: Surgery coming to evaluate patient Admission status: Admit to inpatient   Etta Quill DO [facility] Pager [phone] from 7PM-7AM  If 7AM-7PM, please contact the day physician for the patient [email or web address] Password [email or web address]  03/10/2016, 7:54 PM

## 2016-03-10 NOTE — ED Notes (Signed)
He c/o generalized abd. Pain since ~0700 today which persists; plus few episodes of n/v.  He states he has known cirrhotic liver dis.

## 2016-03-11 ENCOUNTER — Inpatient Hospital Stay (HOSPITAL_COMMUNITY): Payer: Medicaid Other | Admitting: Anesthesiology

## 2016-03-11 ENCOUNTER — Encounter (HOSPITAL_COMMUNITY): Payer: Self-pay | Admitting: General Practice

## 2016-03-11 ENCOUNTER — Encounter (HOSPITAL_COMMUNITY)
Admission: EM | Disposition: A | Discharge status: Home / Self Care | Payer: Self-pay | Source: Home / Self Care | Attending: Internal Medicine

## 2016-03-11 HISTORY — PX: UMBILICAL HERNIA REPAIR: SHX196

## 2016-03-11 LAB — SURGICAL PCR SCREEN
MRSA, PCR: NEGATIVE
Staphylococcus aureus: NEGATIVE

## 2016-03-11 LAB — COMPREHENSIVE METABOLIC PANEL
ALBUMIN: 4.4 g/dL (ref 3.5–5.0)
ALK PHOS: 144 U/L — AB (ref 38–126)
ALT: 27 U/L (ref 17–63)
AST: 29 U/L (ref 15–41)
Anion gap: 10 (ref 5–15)
BILIRUBIN TOTAL: 1.8 mg/dL — AB (ref 0.3–1.2)
BUN: 34 mg/dL — AB (ref 6–20)
CALCIUM: 9.6 mg/dL (ref 8.9–10.3)
CO2: 29 mmol/L (ref 22–32)
CREATININE: 1.51 mg/dL — AB (ref 0.61–1.24)
Chloride: 98 mmol/L — ABNORMAL LOW (ref 101–111)
GFR calc Af Amer: 58 mL/min — ABNORMAL LOW (ref >60)
GFR calc non Af Amer: 50 mL/min — ABNORMAL LOW (ref >60)
GLUCOSE: 141 mg/dL — AB (ref 65–99)
Potassium: 4.8 mmol/L (ref 3.5–5.1)
SODIUM: 137 mmol/L (ref 135–145)
TOTAL PROTEIN: 8.4 g/dL — AB (ref 6.5–8.1)

## 2016-03-11 LAB — CBC
HEMATOCRIT: 46.6 % (ref 39.0–52.0)
HEMOGLOBIN: 16.5 g/dL (ref 13.0–17.0)
MCH: 34.8 pg — ABNORMAL HIGH (ref 26.0–34.0)
MCHC: 35.4 g/dL (ref 30.0–36.0)
MCV: 98.3 fL (ref 78.0–100.0)
Platelets: 297 10*3/uL (ref 150–400)
RBC: 4.74 MIL/uL (ref 4.22–5.81)
RDW: 13.5 % (ref 11.5–15.5)
WBC: 13.9 10*3/uL — ABNORMAL HIGH (ref 4.0–10.5)

## 2016-03-11 LAB — PROTIME-INR
INR: 1.16 (ref 0.00–1.49)
PROTHROMBIN TIME: 14.5 s (ref 11.6–15.2)

## 2016-03-11 SURGERY — REPAIR, HERNIA, UMBILICAL, ADULT
Anesthesia: General | Site: Abdomen

## 2016-03-11 MED ORDER — ROCURONIUM BROMIDE 100 MG/10ML IV SOLN
INTRAVENOUS | Status: DC | PRN
  Administered 2016-03-11: 10 mg via INTRAVENOUS
  Administered 2016-03-11: 30 mg via INTRAVENOUS

## 2016-03-11 MED ORDER — PHENYLEPHRINE HCL 10 MG/ML IJ SOLN
INTRAMUSCULAR | Status: DC | PRN
  Administered 2016-03-11 (×2): 160 ug via INTRAVENOUS

## 2016-03-11 MED ORDER — LACTATED RINGERS IV SOLN
INTRAVENOUS | Status: DC | PRN
  Administered 2016-03-11 (×2): via INTRAVENOUS

## 2016-03-11 MED ORDER — SUCCINYLCHOLINE CHLORIDE 20 MG/ML IJ SOLN
INTRAMUSCULAR | Status: DC | PRN
  Administered 2016-03-11: 100 mg via INTRAVENOUS

## 2016-03-11 MED ORDER — LIDOCAINE HCL (CARDIAC) 20 MG/ML IV SOLN
INTRAVENOUS | Status: AC
  Filled 2016-03-11: qty 5

## 2016-03-11 MED ORDER — PROPOFOL 10 MG/ML IV BOLUS
INTRAVENOUS | Status: AC
Start: 2016-03-11 — End: 2016-03-11
  Filled 2016-03-11: qty 40

## 2016-03-11 MED ORDER — DEXTROSE 5 % IV SOLN
2.0000 g | Freq: Once | INTRAVENOUS | Status: AC
  Administered 2016-03-11: 2 g via INTRAVENOUS

## 2016-03-11 MED ORDER — FENTANYL CITRATE (PF) 100 MCG/2ML IJ SOLN
INTRAMUSCULAR | Status: DC | PRN
  Administered 2016-03-11 (×3): 50 ug via INTRAVENOUS
  Administered 2016-03-11: 100 ug via INTRAVENOUS

## 2016-03-11 MED ORDER — MIDAZOLAM HCL 2 MG/2ML IJ SOLN
INTRAMUSCULAR | Status: AC
  Filled 2016-03-11: qty 2

## 2016-03-11 MED ORDER — ALBUMIN HUMAN 5 % IV SOLN
INTRAVENOUS | Status: AC
  Filled 2016-03-11: qty 250

## 2016-03-11 MED ORDER — FENTANYL CITRATE (PF) 250 MCG/5ML IJ SOLN
INTRAMUSCULAR | Status: AC
  Filled 2016-03-11: qty 5

## 2016-03-11 MED ORDER — ALBUMIN HUMAN 5 % IV SOLN
INTRAVENOUS | Status: DC | PRN
  Administered 2016-03-11: 09:00:00 via INTRAVENOUS

## 2016-03-11 MED ORDER — LIDOCAINE HCL (CARDIAC) 20 MG/ML IV SOLN
INTRAVENOUS | Status: DC | PRN
  Administered 2016-03-11: 75 mg via INTRAVENOUS
  Administered 2016-03-11: 25 mg via INTRATRACHEAL

## 2016-03-11 MED ORDER — DEXAMETHASONE SODIUM PHOSPHATE 10 MG/ML IJ SOLN
INTRAMUSCULAR | Status: DC | PRN
  Administered 2016-03-11: 5 mg via INTRAVENOUS

## 2016-03-11 MED ORDER — PROPOFOL 10 MG/ML IV BOLUS
INTRAVENOUS | Status: DC | PRN
  Administered 2016-03-11: 160 mg via INTRAVENOUS

## 2016-03-11 MED ORDER — ONDANSETRON HCL 4 MG/2ML IJ SOLN
INTRAMUSCULAR | Status: AC
  Filled 2016-03-11: qty 2

## 2016-03-11 MED ORDER — ACETAMINOPHEN 10 MG/ML IV SOLN
INTRAVENOUS | Status: DC | PRN
Start: 2016-03-11 — End: 2016-03-11

## 2016-03-11 MED ORDER — DEXAMETHASONE SODIUM PHOSPHATE 10 MG/ML IJ SOLN
INTRAMUSCULAR | Status: AC
  Filled 2016-03-11: qty 1

## 2016-03-11 MED ORDER — SUGAMMADEX SODIUM 200 MG/2ML IV SOLN
INTRAVENOUS | Status: DC | PRN
  Administered 2016-03-11: 150 mg via INTRAVENOUS
  Administered 2016-03-11: 50 mg via INTRAVENOUS

## 2016-03-11 MED ORDER — ONDANSETRON HCL 4 MG/2ML IJ SOLN
INTRAMUSCULAR | Status: DC | PRN
  Administered 2016-03-11: 4 mg via INTRAVENOUS

## 2016-03-11 MED ORDER — CEFOXITIN SODIUM 2 G IV SOLR
INTRAVENOUS | Status: AC
  Filled 2016-03-11: qty 2

## 2016-03-11 MED ORDER — MIDAZOLAM HCL 5 MG/5ML IJ SOLN
INTRAMUSCULAR | Status: DC | PRN
  Administered 2016-03-11 (×2): 1 mg via INTRAVENOUS

## 2016-03-11 MED ORDER — BUPIVACAINE-EPINEPHRINE (PF) 0.25% -1:200000 IJ SOLN
INTRAMUSCULAR | Status: AC
  Filled 2016-03-11: qty 30

## 2016-03-11 MED ORDER — FENTANYL CITRATE (PF) 250 MCG/5ML IJ SOLN
INTRAMUSCULAR | Status: AC
Start: 2016-03-11 — End: 2016-03-11
  Filled 2016-03-11: qty 5

## 2016-03-11 MED ORDER — ONDANSETRON HCL 4 MG/2ML IJ SOLN
INTRAMUSCULAR | Status: AC
Start: 2016-03-11 — End: 2016-03-11
  Filled 2016-03-11: qty 2

## 2016-03-11 MED ORDER — MORPHINE SULFATE (PF) 2 MG/ML IV SOLN
2.0000 mg | INTRAVENOUS | Status: DC | PRN
  Administered 2016-03-11 – 2016-03-12 (×3): 2 mg via INTRAVENOUS
  Filled 2016-03-11 (×3): qty 1

## 2016-03-11 MED ORDER — HYDROMORPHONE HCL 1 MG/ML IJ SOLN: 0.2500 mg | INTRAMUSCULAR | Status: DC | PRN

## 2016-03-11 MED ORDER — 0.9 % SODIUM CHLORIDE (POUR BTL) OPTIME
TOPICAL | Status: DC | PRN
  Administered 2016-03-11: 1000 mL

## 2016-03-11 SURGICAL SUPPLY — 32 items
APL SKNCLS STERI-STRIP NONHPOA (GAUZE/BANDAGES/DRESSINGS)
BENZOIN TINCTURE PRP APPL 2/3 (GAUZE/BANDAGES/DRESSINGS) IMPLANT
BLADE HEX COATED 2.75 (ELECTRODE) ×3 IMPLANT
CLOSURE WOUND 1/2 X4 (GAUZE/BANDAGES/DRESSINGS)
COVER SURGICAL LIGHT HANDLE (MISCELLANEOUS) ×3 IMPLANT
DECANTER SPIKE VIAL GLASS SM (MISCELLANEOUS) ×3 IMPLANT
DRAPE LAPAROSCOPIC ABDOMINAL (DRAPES) ×3 IMPLANT
ELECT REM PT RETURN 9FT ADLT (ELECTROSURGICAL) ×3
ELECTRODE REM PT RTRN 9FT ADLT (ELECTROSURGICAL) ×1 IMPLANT
GAUZE SPONGE 4X4 12PLY STRL (GAUZE/BANDAGES/DRESSINGS) ×3 IMPLANT
GLOVE BIO SURGEON STRL SZ 6.5 (GLOVE) ×2 IMPLANT
GLOVE BIO SURGEON STRL SZ7 (GLOVE) ×6 IMPLANT
GLOVE BIO SURGEONS STRL SZ 6.5 (GLOVE) ×1
KIT BASIN OR (CUSTOM PROCEDURE TRAY) ×3 IMPLANT
NEEDLE HYPO 22GX1.5 SAFETY (NEEDLE) IMPLANT
NS IRRIG 1000ML POUR BTL (IV SOLUTION) ×3 IMPLANT
PACK GENERAL/GYN (CUSTOM PROCEDURE TRAY) ×3 IMPLANT
RELOAD PROXIMATE 75MM BLUE (ENDOMECHANICALS) ×6 IMPLANT
RELOAD STAPLE 75 3.8 BLU REG (ENDOMECHANICALS) IMPLANT
SET IRRIG TUBING LAPAROSCOPIC (IRRIGATION / IRRIGATOR) ×3 IMPLANT
SLEEVE ADV FIXATION 5X100MM (TROCAR) ×4 IMPLANT
STAPLER PROXIMATE 75MM BLUE (STAPLE) ×3 IMPLANT
STAPLER VISISTAT 35W (STAPLE) IMPLANT
STRIP CLOSURE SKIN 1/2X4 (GAUZE/BANDAGES/DRESSINGS) IMPLANT
SUCTION POOLE TIP (SUCTIONS) ×2 IMPLANT
SUT MNCRL AB 4-0 PS2 18 (SUTURE) IMPLANT
SUT NOVA NAB DX-16 0-1 5-0 T12 (SUTURE) ×3 IMPLANT
SUT SILK 3 0 SH CR/8 (SUTURE) ×2 IMPLANT
SYR CONTROL 10ML LL (SYRINGE) IMPLANT
TOWEL OR 17X26 10 PK STRL BLUE (TOWEL DISPOSABLE) ×3 IMPLANT
TROCAR BLADELESS OPT 5 100 (ENDOMECHANICALS) ×2 IMPLANT
TUBING SMOKE EVAC CO2 (TUBING) ×2 IMPLANT

## 2016-03-11 NOTE — Progress Notes (Signed)
PROGRESS NOTE    SHARONE AGNE  [id number] DOB: Mar 31, 1961 DOA: 03/10/2016 PCP: Minerva Ends, MD Outpatient Specialists: Enzo Montgomery    Brief Narrative: Kevin Nicholson is a 55 y.o. male with medical history significant of cirrhosis of the liver with ascites and grade 1 esophageal varicies. Patient is followed by Dr. Oneida Alar with GI. Cirrhosis is believe to occur in the context of prior EtOH use in the past (non drinking now). He has monthly paracentesis for ascites, otherwise is actually healthy previously. Patient presents to the ED with c/o N/V, abdominal pain, loose stool. Symptoms started 4/22 pm  hernia is found to be painful, hard, non-reducible. CT scan confirms obstruction due to incarcerated umbilical hernia NGT placed 4/23: diagnostic laparoscopy, open HERNIA REPAIR UMBILICAL ADULT, small bowel resection  Assessment & Plan:  Incarcerated umbilical hernia causing SBO  -didn't improve with NPO, NG decompression -s/p open hernia repair and SB resection today -per [facility] -continue IVF, supportive care  Alcoholic cirrhosis of liver - -MELD score of 14 -Child class B due to moderate ascites and grade 1 varices -required monthly paracentesis, until diuretic dose increased, last 2-65months ago -diuretics and nadolol on hold while NPO  Tobacco use -suspect undiagnosed COPD -counseled, nebs PRN  DVT prophylaxis: SCDs Code Status: Full Family Communication: No family in room   Consultants: [facility] Dr.Thomas  Procedures:PROCEDURE: diagnostic laparoscopy, open HERNIA REPAIR UMBILICAL ADULT, small bowel resection  Antimicrobials:  Subjective: Some pain as expected  Objective: Filed Vitals:   03/11/16 1140 03/11/16 1243 03/11/16 1340 03/11/16 1441  BP: 132/68 122/66 104/57 115/72  Pulse: 55 60 66 67  Temp: 97.7 F (36.5 C) 98.6 F (37 C) 98.1 F (36.7 C) 98.3 F (36.8 C)  TempSrc:  Oral Oral Oral  Resp:  14 14 14   Height:      Weight:      SpO2: 98%  100% 100% 100%    Intake/Output Summary (Last 24 hours) at 03/11/16 1619 Last data filed at 03/11/16 1522  Gross per 24 hour  Intake 4547.92 ml  Output   2150 ml  Net 2397.92 ml   Filed Weights   03/10/16 2127  Weight: 86.9 kg (191 lb 9.3 oz)    Examination:  General exam: Appears calm and comfortable, NGT noted Respiratory system: Clear to auscultation. Respiratory effort normal. Cardiovascular system: S1 & S2 heard, RRR. No JVD, murmurs, rubs, gallops or clicks. No pedal edema. Gastrointestinal system: soft, tender as expected, surgical dressing noted Central nervous system: Alert and oriented. No focal neurological deficits. Extremities: Symmetric 5 x 5 power. Skin: No rashes, lesions or ulcers Psychiatry: Judgement and insight appear normal. Mood & affect appropriate.     Data Reviewed: I have personally reviewed following labs and imaging studies  CBC:  Recent Labs Lab 03/10/16 1553 03/11/16 0444  WBC 9.0 13.9*  NEUTROABS 7.3  --   HGB 16.1 16.5  HCT 44.7 46.6  MCV 96.5 98.3  PLT 265 [id number]   Basic Metabolic Panel:  Recent Labs Lab 03/10/16 1553 03/11/16 0444  NA 134* 137  K 4.4 4.8  CL 95* 98*  CO2 28 29  GLUCOSE 119* 141*  BUN 32* 34*  CREATININE 1.27* 1.51*  CALCIUM 9.6 9.6   GFR: Estimated Creatinine Clearance: 57.1 mL/min (by C-G formula based on Cr of 1.51). Liver Function Tests:  Recent Labs Lab 03/10/16 1553 03/11/16 0444  AST 29 29  ALT 30 27  ALKPHOS 144* 144*  BILITOT 1.4* 1.8*  PROT 8.3*  8.4*  ALBUMIN 4.1 4.4    Recent Labs Lab 03/10/16 1553  LIPASE 38   No results for input(s): AMMONIA in the last 168 hours. Coagulation Profile:  Recent Labs Lab 03/11/16 0444  INR 1.16   Cardiac Enzymes: No results for input(s): CKTOTAL, CKMB, CKMBINDEX, TROPONINI in the last 168 hours. BNP (last 3 results) No results for input(s): PROBNP in the last 8760 hours. HbA1C: No results for input(s): HGBA1C in the last 72  hours. CBG: No results for input(s): GLUCAP in the last 168 hours. Lipid Profile: No results for input(s): CHOL, HDL, LDLCALC, TRIG, CHOLHDL, LDLDIRECT in the last 72 hours. Thyroid Function Tests: No results for input(s): TSH, T4TOTAL, FREET4, T3FREE, THYROIDAB in the last 72 hours. Anemia Panel: No results for input(s): VITAMINB12, FOLATE, FERRITIN, TIBC, IRON, RETICCTPCT in the last 72 hours. Urine analysis:    Component Value Date/Time   COLORURINE AMBER* 03/10/2016 1920   APPEARANCEUR CLEAR 03/10/2016 1920   LABSPEC 1.025 03/10/2016 1920   PHURINE 5.5 03/10/2016 1920   GLUCOSEU NEGATIVE 03/10/2016 1920   HGBUR NEGATIVE 03/10/2016 1920   BILIRUBINUR NEGATIVE 03/10/2016 1920   KETONESUR NEGATIVE 03/10/2016 1920   PROTEINUR NEGATIVE 03/10/2016 1920   NITRITE NEGATIVE 03/10/2016 1920   LEUKOCYTESUR NEGATIVE 03/10/2016 1920   Sepsis Labs: @LABRCNTIP (procalcitonin:4,lacticidven:4)  ) Recent Results (from the past 240 hour(s))  Surgical pcr screen     Status: None   Collection Time: 03/11/16  7:23 AM  Result Value Ref Range Status   MRSA, PCR NEGATIVE NEGATIVE Final   Staphylococcus aureus NEGATIVE NEGATIVE Final    Comment:        The Xpert SA Assay (FDA approved for NASAL specimens in patients over 79 years of age), is one component of a comprehensive surveillance program.  Test performance has been validated by [facility] for patients greater than or equal to 60 year old. It is not intended to diagnose infection nor to guide or monitor treatment.          Radiology Studies: Ct Abdomen Pelvis Wo Contrast  03/10/2016  CLINICAL DATA:  Abdominal pain, onset today. Nausea and vomiting. Ventral hernia, question incarcerated. EXAM: CT ABDOMEN AND PELVIS WITHOUT CONTRAST TECHNIQUE: Multidetector CT imaging of the abdomen and pelvis was performed following the standard protocol without IV contrast. COMPARISON:  Most recent CT 04/13/2015 FINDINGS: Lower chest: The  included lung bases are clear. No pleural effusion. Normal heart size. Liver: Nodular contours consistent with cirrhosis. No evidence of focal lesion allowing for lack contrast. Hepatobiliary: Gallbladder physiologically distended, no calcified stone. No biliary dilatation. Pancreas: No ductal dilatation.  No peripancreatic inflammation. Spleen: Upper normal in size.  Scattered granuloma. Adrenal glands: No nodule. Kidneys: No hydronephrosis or urolithiasis. Kidneys symmetric in size. No perinephric stranding. Stomach/Bowel: Stomach distended with ingested contrast. Small amount contrast in the distal esophagus. Dilated fluid-filled small bowel loops with transition point umbilical hernia. Exiting and distal small bowel loops are decompressed. No pneumatosis. Small volume stool throughout the colon. Distal colonic diverticulosis, no evidence of diverticulitis. Appendix is normal. Vascular/Lymphatic: No retroperitoneal adenopathy. Abdominal aorta is normal in caliber. Moderate atherosclerosis without aneurysm. Reproductive: Prostate gland normal in size. Bladder: Physiologically distended. Other: Moderate volume intra-abdominal and pelvic ascites. Minimal ascites in the umbilical hernia. No free air. No definite loculated fluid collection. Musculoskeletal: There are no acute or suspicious osseous abnormalities. IMPRESSION: 1. Small-bowel obstruction secondary to incarcerated umbilical hernia. 2. Cirrhosis with moderate volume intra-abdominal pelvic ascites. Electronically Signed   By: Threasa Beards  Ehinger M.D.   On: 03/10/2016 18:09        Scheduled Meds:  Continuous Infusions: . sodium chloride 100 mL/hr at 03/11/16 1522     LOS: 1 day    Time spent: 76min    Domenic Polite, MD [facility] Pager [phone]  If 7PM-7AM, please contact night-coverage [email or web address] Password [facility] 03/11/2016, 4:19 PM

## 2016-03-11 NOTE — Progress Notes (Signed)
Utilization review completed.  

## 2016-03-11 NOTE — Progress Notes (Signed)
SBO (small bowel obstruction) (HCC)  Subjective: RN and ED staff unable to place NG tube.  Patient continues to vomit. Abdominal pain increasing.  Objective: Vital signs in last 24 hours: Temp:  [97.5 F (36.4 C)-98 F (36.7 C)] 98 F (36.7 C) (04/23 0800) Pulse Rate:  [56-78] 78 (04/23 0800) Resp:  [18-19] 18 (04/23 0450) BP: (115-141)/(61-80) 136/79 mmHg (04/23 0800) SpO2:  [92 %-99 %] 92 % (04/23 0800) Weight:  [86.9 kg (191 lb 9.3 oz)] 86.9 kg (191 lb 9.3 oz) (04/22 2127)    Intake/Output from previous day: 04/22 0701 - 04/23 0700 In: 1411.3 [I.V.:1411.3] Out: 800 [Emesis/NG output:800] Intake/Output this shift:    General appearance: alert and cooperative GI: Abdomen soft, somewhat distended, nontender to palpation except for incarcerated hernia.  Lab Results:  Results for orders placed or performed during the hospital encounter of 03/10/16 (from the past 24 hour(s))  Comprehensive metabolic panel     Status: Abnormal   Collection Time: 03/10/16  3:53 PM  Result Value Ref Range   Sodium 134 (L) 135 - 145 mmol/L   Potassium 4.4 3.5 - 5.1 mmol/L   Chloride 95 (L) 101 - 111 mmol/L   CO2 28 22 - 32 mmol/L   Glucose, Bld 119 (H) 65 - 99 mg/dL   BUN 32 (H) 6 - 20 mg/dL   Creatinine, Ser 1.27 (H) 0.61 - 1.24 mg/dL   Calcium 9.6 8.9 - 10.3 mg/dL   Total Protein 8.3 (H) 6.5 - 8.1 g/dL   Albumin 4.1 3.5 - 5.0 g/dL   AST 29 15 - 41 U/L   ALT 30 17 - 63 U/L   Alkaline Phosphatase 144 (H) 38 - 126 U/L   Total Bilirubin 1.4 (H) 0.3 - 1.2 mg/dL   GFR calc non Af Amer >60 >60 mL/min   GFR calc Af Amer >60 >60 mL/min   Anion gap 11 5 - 15  Lipase, blood     Status: None   Collection Time: 03/10/16  3:53 PM  Result Value Ref Range   Lipase 38 11 - 51 U/L  CBC WITH DIFFERENTIAL     Status: Abnormal   Collection Time: 03/10/16  3:53 PM  Result Value Ref Range   WBC 9.0 4.0 - 10.5 K/uL   RBC 4.63 4.22 - 5.81 MIL/uL   Hemoglobin 16.1 13.0 - 17.0 g/dL   HCT 44.7 39.0 - 52.0  %   MCV 96.5 78.0 - 100.0 fL   MCH 34.8 (H) 26.0 - 34.0 pg   MCHC 36.0 30.0 - 36.0 g/dL   RDW 13.5 11.5 - 15.5 %   Platelets 265 150 - 400 K/uL   Neutrophils Relative % 81 %   Neutro Abs 7.3 1.7 - 7.7 K/uL   Lymphocytes Relative 11 %   Lymphs Abs 1.0 0.7 - 4.0 K/uL   Monocytes Relative 8 %   Monocytes Absolute 0.7 0.1 - 1.0 K/uL   Eosinophils Relative 0 %   Eosinophils Absolute 0.0 0.0 - 0.7 K/uL   Basophils Relative 0 %   Basophils Absolute 0.0 0.0 - 0.1 K/uL  Urinalysis, Routine w reflex microscopic (not at [facility])     Status: Abnormal   Collection Time: 03/10/16  7:20 PM  Result Value Ref Range   Color, Urine AMBER (A) YELLOW   APPearance CLEAR CLEAR   Specific Gravity, Urine 1.025 1.005 - 1.030   pH 5.5 5.0 - 8.0   Glucose, UA NEGATIVE NEGATIVE mg/dL   Hgb urine dipstick  NEGATIVE NEGATIVE   Bilirubin Urine NEGATIVE NEGATIVE   Ketones, ur NEGATIVE NEGATIVE mg/dL   Protein, ur NEGATIVE NEGATIVE mg/dL   Nitrite NEGATIVE NEGATIVE   Leukocytes, UA NEGATIVE NEGATIVE  CBC     Status: Abnormal   Collection Time: 03/11/16  4:44 AM  Result Value Ref Range   WBC 13.9 (H) 4.0 - 10.5 K/uL   RBC 4.74 4.22 - 5.81 MIL/uL   Hemoglobin 16.5 13.0 - 17.0 g/dL   HCT 46.6 39.0 - 52.0 %   MCV 98.3 78.0 - 100.0 fL   MCH 34.8 (H) 26.0 - 34.0 pg   MCHC 35.4 30.0 - 36.0 g/dL   RDW 13.5 11.5 - 15.5 %   Platelets 297 150 - 400 K/uL  Comprehensive metabolic panel     Status: Abnormal   Collection Time: 03/11/16  4:44 AM  Result Value Ref Range   Sodium 137 135 - 145 mmol/L   Potassium 4.8 3.5 - 5.1 mmol/L   Chloride 98 (L) 101 - 111 mmol/L   CO2 29 22 - 32 mmol/L   Glucose, Bld 141 (H) 65 - 99 mg/dL   BUN 34 (H) 6 - 20 mg/dL   Creatinine, Ser 1.51 (H) 0.61 - 1.24 mg/dL   Calcium 9.6 8.9 - 10.3 mg/dL   Total Protein 8.4 (H) 6.5 - 8.1 g/dL   Albumin 4.4 3.5 - 5.0 g/dL   AST 29 15 - 41 U/L   ALT 27 17 - 63 U/L   Alkaline Phosphatase 144 (H) 38 - 126 U/L   Total Bilirubin 1.8 (H) 0.3 - 1.2  mg/dL   GFR calc non Af Amer 50 (L) >60 mL/min   GFR calc Af Amer 58 (L) >60 mL/min   Anion gap 10 5 - 15  Protime-INR     Status: None   Collection Time: 03/11/16  4:44 AM  Result Value Ref Range   Prothrombin Time 14.5 11.6 - 15.2 seconds   INR 1.16 0.00 - 1.49     Studies/Results Radiology     MEDS, Scheduled     Assessment: SBO (small bowel obstruction) ([place]) Unfortunately, patient is unable to tolerate NG placement. His white blood cell count is also increasing. I am concerned that his bowel is becoming strangulated.  Plan: I have recommended a laparoscopic hernia repair with possible bowel resection. We discussed the many complications of surgery in a patient with liver disease. We discussed the anesthesia can induce liver failure in some patients. We discussed that his chronic wounds may continue to leak ascites after the procedure and this could lead to infection of ascites. We discussed the risk of recurrence and the possible need for mesh placement. The patient understands that there are no good options for his condition. He agrees to proceed with surgery and I believe he understands the risk accordingly.  LOS: 1 day    Rosario Adie, MD [facility], [place]   03/11/2016 8:44 AM

## 2016-03-11 NOTE — Op Note (Signed)
03/10/2016 - 03/11/2016  10:34 AM  PATIENT:  Kevin Nicholson  55 y.o. male  Patient Care Team: Boykin Nearing, MD as PCP - General (Family Medicine) Danie Binder, MD as Consulting Physician (Gastroenterology)  PRE-OPERATIVE DIAGNOSIS:  incarcerated umbilical hernia  POST-OPERATIVE DIAGNOSIS:  stangulated umbilical hernia   PROCEDURE: diagnostic laparoscopy, open HERNIA REPAIR UMBILICAL ADULT, small bowel resection    Surgeon(s): Leighton Ruff, MD Alphonsa Overall, MD  ASSISTANT: Dr Lucia Gaskins   ANESTHESIA:   general  EBL:  Total I/O In: 1250 [I.V.:1000; IV Piggyback:250] Out: [id number] [Urine:75; Other:500; Blood:100]  DRAINS: none   SPECIMEN:  Source of Specimen:  small bowel and hernia sac  DISPOSITION OF SPECIMEN:  PATHOLOGY  COUNTS:  YES  PLAN OF CARE: pt admitted  PATIENT DISPOSITION:  PACU - hemodynamically stable.  INDICATION: 55 year old male with alcoholic cirrhosis who presents to the hospital with incarcerated umbilical hernia. We attempted medical management with NG tube for decompression but the patient was unable to tolerate this. His white count increased overnight and I recommended that we proceed to the OR for hernia repair.   OR FINDINGS: Incarcerated portion of small bowel with a small area of what appeared to be full-thickness necrosis.  DESCRIPTION: the patient was identified in the preoperative holding area and taken to the OR where they were laid supine on the operating room table.  General anesthesia was induced without difficulty. SCDs were also noted to be in place prior to the initiation of anesthesia.  The patient was then prepped and draped in the usual sterile fashion.   A surgical timeout was performed indicating the correct patient, procedure, positioning and need for preoperative antibiotics.  I began by using Optiview trocar in the left upper quadrant with the patient in reverse Trendelenburg position. I introduced the abdomen without difficulty.  The abdomen was insufflated to a proximally 15 mmHg. Two 5 mm ports were placed in the suprapubic region and left lower quadrant under direct visualization.  I aspirated approximately 1 L of ascites from the patient's abdomen. I evaluated the entire abdomen. There are multiple loops of distended small bowel. The liver was cirrhotic and scarred. The NG tube was in good position within the patient's stomach.    We attempted to reduce the hernia. It was unable to be reduced and I transected a portion of the fascia to help with this. We will then able to pull the small bowel out of the hernia. Upon inspection there was a portion of the bowel that appeared compromised and a smaller portion that appeared to be full-thickness necrosis. We decided to make a periumbilical incision and evaluate this further. An incision was made using a 10 blade scalpel. This carried down through subcutaneous tissues using electrocautery. The fascia was incised at midline to allow for removal of the small bowel. The hernia site was enlarged. We removed the portion of the small bowel and decided to perform a small bowel resection. This was done using 75 mm blue load GIA staplers. The common enterotomy channel was also closed using a 75 mm blue load stapler. The mesentery of the portion of small bowel was suture ligated and the specimen was sent to pathology for further examination. An anti-tension suture was placed in the crotch of the anastomosis. A small staple line bleeder was sutured closed using a 3-0 silk suture. This was then placed back into the abdomen. The fascia was then close using interrupted #1 Novafil sutures. We then reinsufflated the abdomen and evaluated  the closure. There was an area towards the superior aspect of the incision did not appear to be completely closed. We opened up the hernia sac at this area and found another fascial defect. This was close using 2 more #1 Novafil sutures. Upon re-insufflation of the abdomen  there was no other signs of leak. The abdomen was desufflated and the port sites were closed using 4-0 Vicryl suture and Dermabond. The subcutaneous tissue of the midline incision was closed using interrupted 2-0 Vicryl sutures and a 4-0 Vicryl running suture for skin closure. The patient tolerated the procedure well and sent to the postanesthesia care unit in stable condition. All counts were correct per operating room staff.

## 2016-03-11 NOTE — Anesthesia Postprocedure Evaluation (Signed)
Anesthesia Post Note  Patient: Yoan Massett Selke  Procedure(s) Performed: Procedure(s) (LRB): laparoscopy, open HERNIA REPAIR UMBILICAL ADULT, small bowel resection  (N/A)  Patient location during evaluation: PACU Anesthesia Type: General Level of consciousness: awake and alert Pain management: pain level controlled Vital Signs Assessment: post-procedure vital signs reviewed and stable Respiratory status: spontaneous breathing, nonlabored ventilation, respiratory function stable and patient connected to nasal cannula oxygen Cardiovascular status: blood pressure returned to baseline and stable Postop Assessment: no signs of nausea or vomiting Anesthetic complications: no    Last Vitals:  Filed Vitals:   03/11/16 0800 03/11/16 1045  BP: 136/79   Pulse: 78   Temp: 36.7 C 36.6 C  Resp:      Last Pain:  Filed Vitals:   03/11/16 1054  PainSc: 9                  Bow Buntyn S

## 2016-03-11 NOTE — Transfer of Care (Signed)
Immediate Anesthesia Transfer of Care Note  Patient: Kevin Nicholson  Procedure(s) Performed: Procedure(s): laparoscopy, open HERNIA REPAIR UMBILICAL ADULT, small bowel resection  (N/A)  Patient Location: PACU  Anesthesia Type:General  Level of Consciousness: awake, alert , oriented and patient cooperative  Airway & Oxygen Therapy: Patient Spontanous Breathing and Patient connected to face mask oxygen  Post-op Assessment: Report given to RN, Post -op Vital signs reviewed and stable and Patient moving all extremities X 4  Post vital signs: stable  Last Vitals:  Filed Vitals:   03/11/16 0450 03/11/16 0800  BP: 141/61 136/79  Pulse: 67 78  Temp: 36.7 C 36.7 C  Resp: 18     Complications: No apparent anesthesia complications

## 2016-03-11 NOTE — Addendum Note (Signed)
Addendum  created 03/11/16 1532 by Lissa Morales, CRNA   Modules edited: Charges VN

## 2016-03-11 NOTE — Anesthesia Procedure Notes (Signed)
Procedure Name: Intubation Date/Time: 03/11/2016 9:12 AM Performed by: Lissa Morales Pre-anesthesia Checklist: Patient identified, Emergency Drugs available, Suction available and Patient being monitored Patient Re-evaluated:Patient Re-evaluated prior to inductionOxygen Delivery Method: Circle System Utilized Preoxygenation: Pre-oxygenation with 100% oxygen Intubation Type: IV induction Ventilation: Mask ventilation without difficulty Laryngoscope Size: Mac and 4 Grade View: Grade II Tube type: Oral Tube size: 8.0 (taper tubr) mm Number of attempts: 1 Airway Equipment and Method: Stylet and Oral airway (RSI with cricoid) Placement Confirmation: ETT inserted through vocal cords under direct vision,  positive ETCO2 and breath sounds checked- equal and bilateral Secured at: 22 cm Tube secured with: Tape Dental Injury: Teeth and Oropharynx as per pre-operative assessment  Comments: Very poor dentition with rotten teeth

## 2016-03-11 NOTE — Anesthesia Preprocedure Evaluation (Addendum)
Anesthesia Evaluation  Patient identified by MRN, date of birth, ID band Patient awake    Reviewed: Allergy & Precautions, NPO status , Patient's Chart, lab work & pertinent test results  Airway Mallampati: II  TM Distance: >3 FB Neck ROM: Full    Dental no notable dental hx.    Pulmonary Current Smoker,    breath sounds clear to auscultation + decreased breath sounds      Cardiovascular negative cardio ROS Normal cardiovascular exam Rhythm:Regular Rate:Normal     Neuro/Psych negative neurological ROS  negative psych ROS   GI/Hepatic negative GI ROS, (+) Cirrhosis   ascites    ,   Endo/Other  negative endocrine ROS  Renal/GU negative Renal ROS  negative genitourinary   Musculoskeletal negative musculoskeletal ROS (+)   Abdominal   Peds negative pediatric ROS (+)  Hematology negative hematology ROS (+)   Anesthesia Other Findings   Reproductive/Obstetrics negative OB ROS                           Anesthesia Physical Anesthesia Plan  ASA: III and emergent  Anesthesia Plan: General   Post-op Pain Management:    Induction: Intravenous, Rapid sequence and Cricoid pressure planned  Airway Management Planned: Oral ETT  Additional Equipment:   Intra-op Plan:   Post-operative Plan: Possible Post-op intubation/ventilation  Informed Consent: I have reviewed the patients History and Physical, chart, labs and discussed the procedure including the risks, benefits and alternatives for the proposed anesthesia with the patient or authorized representative who has indicated his/her understanding and acceptance.   Dental advisory given  Plan Discussed with: CRNA and Surgeon  Anesthesia Plan Comments:       Anesthesia Quick Evaluation

## 2016-03-12 ENCOUNTER — Encounter (HOSPITAL_COMMUNITY): Payer: Self-pay | Admitting: General Surgery

## 2016-03-12 LAB — COMPREHENSIVE METABOLIC PANEL
ALBUMIN: 3 g/dL — AB (ref 3.5–5.0)
ALT: 16 U/L — ABNORMAL LOW (ref 17–63)
ANION GAP: 8 (ref 5–15)
AST: 18 U/L (ref 15–41)
Alkaline Phosphatase: 82 U/L (ref 38–126)
BUN: 35 mg/dL — ABNORMAL HIGH (ref 6–20)
CO2: 25 mmol/L (ref 22–32)
Calcium: 8.5 mg/dL — ABNORMAL LOW (ref 8.9–10.3)
Chloride: 105 mmol/L (ref 101–111)
Creatinine, Ser: 1.37 mg/dL — ABNORMAL HIGH (ref 0.61–1.24)
GFR calc non Af Amer: 57 mL/min — ABNORMAL LOW (ref >60)
GLUCOSE: 122 mg/dL — AB (ref 65–99)
POTASSIUM: 5.1 mmol/L (ref 3.5–5.1)
SODIUM: 138 mmol/L (ref 135–145)
Total Bilirubin: 2.6 mg/dL — ABNORMAL HIGH (ref 0.3–1.2)
Total Protein: 6.2 g/dL — ABNORMAL LOW (ref 6.5–8.1)

## 2016-03-12 LAB — CBC
HCT: 40.6 % (ref 39.0–52.0)
HEMOGLOBIN: 14.1 g/dL (ref 13.0–17.0)
MCH: 34.2 pg — AB (ref 26.0–34.0)
MCHC: 34.7 g/dL (ref 30.0–36.0)
MCV: 98.5 fL (ref 78.0–100.0)
PLATELETS: 217 10*3/uL (ref 150–400)
RBC: 4.12 MIL/uL — ABNORMAL LOW (ref 4.22–5.81)
RDW: 13.7 % (ref 11.5–15.5)
WBC: 7.6 10*3/uL (ref 4.0–10.5)

## 2016-03-12 MED ORDER — ENOXAPARIN SODIUM 40 MG/0.4ML ~~LOC~~ SOLN
40.0000 mg | SUBCUTANEOUS | Status: DC
  Administered 2016-03-12 – 2016-03-20 (×9): 40 mg via SUBCUTANEOUS
  Filled 2016-03-12 (×10): qty 0.4

## 2016-03-12 NOTE — Progress Notes (Signed)
Patient ID: Kevin Nicholson, male   DOB: [birth year], 55 y.o.   MRN: [id number]     [address] [place], [place] [address]    Phone: [phone] FAX: [phone]     Subjective: No n/v.   1.72m ngt output. 1100 uop.  Vss.  Afebrile. sCr down.  WBC normalized.    Objective:  Vital signs:  Filed Vitals:   03/11/16 1752 03/11/16 2104 03/12/16 0150 03/12/16 0607  BP: 108/60 123/61 116/62 112/70  Pulse: 68 74 73 68  Temp: 98.6 F (37 C) 98.5 F (36.9 C) 98.7 F (37.1 C) 97.5 F (36.4 C)  TempSrc: Oral Oral Oral Oral  Resp: '16 14 18 18  '$ Height:      Weight:      SpO2: 100% 98% 95% 96%    Last BM Date:  (PTA)  Intake/Output   Yesterday:  04/23 0701 - 04/24 0700 In: 3136.7 [I.V.:2886.7; IV Piggyback:250] Out: 3150 [Urine:1100; Emesis/NG output:1050; Blood:100] This shift:    I/O last 3 completed shifts: In: 4547.9 [I.V.:4297.9; IV Piggyback:250] Out: 36440[Urine:1100; Emesis/NG output:1850; Other:900; Blood:100]    Physical Exam: General: Pt awake/alert/oriented x4 in no acute distress  Abdomen: Soft.  Nondistended.  Mildly tender at incisions only.  Incision is dry.  Hypoactive bowel sounds.  No evidence of peritonitis.  No incarcerated hernias.    Problem List:   Principal Problem:   SBO (small bowel obstruction) (HCC) Active Problems:   Alcoholic cirrhosis of liver with ascites (HCC)   Incarcerated umbilical hernia    Results:   Labs: Results for orders placed or performed during the hospital encounter of 03/10/16 (from the past 48 hour(s))  Comprehensive metabolic panel     Status: Abnormal   Collection Time: 03/10/16  3:53 PM  Result Value Ref Range   Sodium 134 (L) 135 - 145 mmol/L   Potassium 4.4 3.5 - 5.1 mmol/L   Chloride 95 (L) 101 - 111 mmol/L   CO2 28 22 - 32 mmol/L   Glucose, Bld 119 (H) 65 - 99 mg/dL   BUN 32 (H) 6 - 20 mg/dL   Creatinine, Ser 1.27 (H) 0.61 - 1.24 mg/dL    Calcium 9.6 8.9 - 10.3 mg/dL   Total Protein 8.3 (H) 6.5 - 8.1 g/dL   Albumin 4.1 3.5 - 5.0 g/dL   AST 29 15 - 41 U/L   ALT 30 17 - 63 U/L   Alkaline Phosphatase 144 (H) 38 - 126 U/L   Total Bilirubin 1.4 (H) 0.3 - 1.2 mg/dL   GFR calc non Af Amer >60 >60 mL/min   GFR calc Af Amer >60 >60 mL/min    Comment: (NOTE) The eGFR has been calculated using the CKD EPI equation. This calculation has not been validated in all clinical situations. eGFR's persistently <60 mL/min signify possible Chronic Kidney Disease.    Anion gap 11 5 - 15  Lipase, blood     Status: None   Collection Time: 03/10/16  3:53 PM  Result Value Ref Range   Lipase 38 11 - 51 U/L  CBC WITH DIFFERENTIAL     Status: Abnormal   Collection Time: 03/10/16  3:53 PM  Result Value Ref Range   WBC 9.0 4.0 - 10.5 K/uL   RBC 4.63 4.22 - 5.81 MIL/uL   Hemoglobin 16.1 13.0 - 17.0 g/dL   HCT 44.7 39.0 - 52.0 %   MCV 96.5 78.0 -  100.0 fL   MCH 34.8 (H) 26.0 - 34.0 pg   MCHC 36.0 30.0 - 36.0 g/dL   RDW 13.5 11.5 - 15.5 %   Platelets 265 150 - 400 K/uL   Neutrophils Relative % 81 %   Neutro Abs 7.3 1.7 - 7.7 K/uL   Lymphocytes Relative 11 %   Lymphs Abs 1.0 0.7 - 4.0 K/uL   Monocytes Relative 8 %   Monocytes Absolute 0.7 0.1 - 1.0 K/uL   Eosinophils Relative 0 %   Eosinophils Absolute 0.0 0.0 - 0.7 K/uL   Basophils Relative 0 %   Basophils Absolute 0.0 0.0 - 0.1 K/uL  Urinalysis, Routine w reflex microscopic (not at [facility])     Status: Abnormal   Collection Time: 03/10/16  7:20 PM  Result Value Ref Range   Color, Urine AMBER (A) YELLOW    Comment: BIOCHEMICALS MAY BE AFFECTED BY COLOR   APPearance CLEAR CLEAR   Specific Gravity, Urine 1.025 1.005 - 1.030   pH 5.5 5.0 - 8.0   Glucose, UA NEGATIVE NEGATIVE mg/dL   Hgb urine dipstick NEGATIVE NEGATIVE   Bilirubin Urine NEGATIVE NEGATIVE   Ketones, ur NEGATIVE NEGATIVE mg/dL   Protein, ur NEGATIVE NEGATIVE mg/dL   Nitrite NEGATIVE NEGATIVE   Leukocytes, UA NEGATIVE  NEGATIVE    Comment: MICROSCOPIC NOT DONE ON URINES WITH NEGATIVE PROTEIN, BLOOD, LEUKOCYTES, NITRITE, OR GLUCOSE <1000 mg/dL.  CBC     Status: Abnormal   Collection Time: 03/11/16  4:44 AM  Result Value Ref Range   WBC 13.9 (H) 4.0 - 10.5 K/uL   RBC 4.74 4.22 - 5.81 MIL/uL   Hemoglobin 16.5 13.0 - 17.0 g/dL   HCT 46.6 39.0 - 52.0 %   MCV 98.3 78.0 - 100.0 fL   MCH 34.8 (H) 26.0 - 34.0 pg   MCHC 35.4 30.0 - 36.0 g/dL   RDW 13.5 11.5 - 15.5 %   Platelets 297 150 - 400 K/uL  Comprehensive metabolic panel     Status: Abnormal   Collection Time: 03/11/16  4:44 AM  Result Value Ref Range   Sodium 137 135 - 145 mmol/L   Potassium 4.8 3.5 - 5.1 mmol/L   Chloride 98 (L) 101 - 111 mmol/L   CO2 29 22 - 32 mmol/L   Glucose, Bld 141 (H) 65 - 99 mg/dL   BUN 34 (H) 6 - 20 mg/dL   Creatinine, Ser 1.51 (H) 0.61 - 1.24 mg/dL   Calcium 9.6 8.9 - 10.3 mg/dL   Total Protein 8.4 (H) 6.5 - 8.1 g/dL   Albumin 4.4 3.5 - 5.0 g/dL   AST 29 15 - 41 U/L   ALT 27 17 - 63 U/L   Alkaline Phosphatase 144 (H) 38 - 126 U/L   Total Bilirubin 1.8 (H) 0.3 - 1.2 mg/dL   GFR calc non Af Amer 50 (L) >60 mL/min   GFR calc Af Amer 58 (L) >60 mL/min    Comment: (NOTE) The eGFR has been calculated using the CKD EPI equation. This calculation has not been validated in all clinical situations. eGFR's persistently <60 mL/min signify possible Chronic Kidney Disease.    Anion gap 10 5 - 15  Protime-INR     Status: None   Collection Time: 03/11/16  4:44 AM  Result Value Ref Range   Prothrombin Time 14.5 11.6 - 15.2 seconds   INR 1.16 0.00 - 1.49  Surgical pcr screen     Status: None   Collection Time: 03/11/16  7:23 AM  Result Value Ref Range   MRSA, PCR NEGATIVE NEGATIVE   Staphylococcus aureus NEGATIVE NEGATIVE    Comment:        The Xpert SA Assay (FDA approved for NASAL specimens in patients over 62 years of age), is one component of a comprehensive surveillance program.  Test performance has been  validated by [facility] for patients greater than or equal to 33 year old. It is not intended to diagnose infection nor to guide or monitor treatment.   CBC     Status: Abnormal   Collection Time: 03/12/16  4:16 AM  Result Value Ref Range   WBC 7.6 4.0 - 10.5 K/uL   RBC 4.12 (L) 4.22 - 5.81 MIL/uL   Hemoglobin 14.1 13.0 - 17.0 g/dL   HCT 40.6 39.0 - 52.0 %   MCV 98.5 78.0 - 100.0 fL   MCH 34.2 (H) 26.0 - 34.0 pg   MCHC 34.7 30.0 - 36.0 g/dL   RDW 13.7 11.5 - 15.5 %   Platelets 217 150 - 400 K/uL  Comprehensive metabolic panel     Status: Abnormal   Collection Time: 03/12/16  4:16 AM  Result Value Ref Range   Sodium 138 135 - 145 mmol/L   Potassium 5.1 3.5 - 5.1 mmol/L   Chloride 105 101 - 111 mmol/L   CO2 25 22 - 32 mmol/L   Glucose, Bld 122 (H) 65 - 99 mg/dL   BUN 35 (H) 6 - 20 mg/dL   Creatinine, Ser 1.37 (H) 0.61 - 1.24 mg/dL   Calcium 8.5 (L) 8.9 - 10.3 mg/dL   Total Protein 6.2 (L) 6.5 - 8.1 g/dL   Albumin 3.0 (L) 3.5 - 5.0 g/dL   AST 18 15 - 41 U/L   ALT 16 (L) 17 - 63 U/L   Alkaline Phosphatase 82 38 - 126 U/L   Total Bilirubin 2.6 (H) 0.3 - 1.2 mg/dL   GFR calc non Af Amer 57 (L) >60 mL/min   GFR calc Af Amer >60 >60 mL/min    Comment: (NOTE) The eGFR has been calculated using the CKD EPI equation. This calculation has not been validated in all clinical situations. eGFR's persistently <60 mL/min signify possible Chronic Kidney Disease.    Anion gap 8 5 - 15    Imaging / Studies: Ct Abdomen Pelvis Wo Contrast  03/10/2016  CLINICAL DATA:  Abdominal pain, onset today. Nausea and vomiting. Ventral hernia, question incarcerated. EXAM: CT ABDOMEN AND PELVIS WITHOUT CONTRAST TECHNIQUE: Multidetector CT imaging of the abdomen and pelvis was performed following the standard protocol without IV contrast. COMPARISON:  Most recent CT 04/13/2015 FINDINGS: Lower chest: The included lung bases are clear. No pleural effusion. Normal heart size. Liver: Nodular contours  consistent with cirrhosis. No evidence of focal lesion allowing for lack contrast. Hepatobiliary: Gallbladder physiologically distended, no calcified stone. No biliary dilatation. Pancreas: No ductal dilatation.  No peripancreatic inflammation. Spleen: Upper normal in size.  Scattered granuloma. Adrenal glands: No nodule. Kidneys: No hydronephrosis or urolithiasis. Kidneys symmetric in size. No perinephric stranding. Stomach/Bowel: Stomach distended with ingested contrast. Small amount contrast in the distal esophagus. Dilated fluid-filled small bowel loops with transition point umbilical hernia. Exiting and distal small bowel loops are decompressed. No pneumatosis. Small volume stool throughout the colon. Distal colonic diverticulosis, no evidence of diverticulitis. Appendix is normal. Vascular/Lymphatic: No retroperitoneal adenopathy. Abdominal aorta is normal in caliber. Moderate atherosclerosis without aneurysm. Reproductive: Prostate gland normal in size. Bladder: Physiologically distended. Other: Moderate  volume intra-abdominal and pelvic ascites. Minimal ascites in the umbilical hernia. No free air. No definite loculated fluid collection. Musculoskeletal: There are no acute or suspicious osseous abnormalities. IMPRESSION: 1. Small-bowel obstruction secondary to incarcerated umbilical hernia. 2. Cirrhosis with moderate volume intra-abdominal pelvic ascites. Electronically Signed   By: Jeb Levering M.D.   On: 03/10/2016 18:09    Medications / Allergies:  Scheduled Meds:  Continuous Infusions: . sodium chloride 100 mL/hr at 03/12/16 0115   PRN Meds:.HYDROmorphone (DILAUDID) injection, morphine injection, ondansetron (ZOFRAN) IV  Antibiotics: Anti-infectives    Start     Dose/Rate Route Frequency Ordered Stop   03/11/16 1015  cefOXitin (MEFOXIN) 2 g in dextrose 5 % 50 mL IVPB     2 g 100 mL/hr over 30 Minutes Intravenous  Once 03/11/16 1011 03/11/16 0915         Assessment/Plan Strangulated umbilical hernia POD#1 open umbilical hernia repair, SBR---Dr. Marcello Moores -continue NGT, await bowel rest, mobilize, IS, DC foley FEN-IVF, pain control VTE prophylaxis-? Add heparin, SCDs Cirrhosis of liver/Etoh abuse    Erby Pian, [employer] [facility] Pager [phone](7A-4:30P)   03/12/2016 10:09 AM

## 2016-03-12 NOTE — Progress Notes (Addendum)
PROGRESS NOTE    ORA Kevin Nicholson  [id number] DOB: 01/10/1961 DOA: 03/10/2016 PCP: Minerva Ends, MD Outpatient Specialists: En[facility]    Brief Narrative: Kevin Nicholson is a 55 y.o. male with medical history significant of cirrhosis of the liver with ascites and grade 1 esophageal varicies. Patient is followed by Dr. Oneida Alar with GI. Cirrhosis is believe to occur in the context of prior EtOH use in the past (non drinking now). He has monthly paracentesis for ascites, otherwise is actually healthy previously. Patient presents to the ED with c/o N/V, abdominal pain, loose stool. Symptoms started 4/22 pm  hernia is found to be painful, hard, non-reducible. CT scan confirms obstruction due to incarcerated umbilical hernia NGT placed 4/23: diagnostic laparoscopy, open HERNIA REPAIR UMBILICAL ADULT, small bowel resection  Assessment & Plan:  Incarcerated umbilical hernia causing SBO  -didn't improve with NPO, NG decompression -s/p open hernia repair and SB resection 4/23  -per [facility] -continue IVF, supportive care, NGT  Alcoholic cirrhosis of liver - -MELD score of 14 -Child class B due to moderate ascites and grade 1 varices -required monthly paracentesis, until diuretic dose increased, last 2-67months ago -diuretics and nadolol on hold while NPO  Tobacco use -suspect undiagnosed COPD -counseled, nebs PRN  DVT prophylaxis: add lovenox Code Status: Full Family Communication: No family in room   Consultants: [facility] Dr.Thomas  Procedures:PROCEDURE: diagnostic laparoscopy, open HERNIA REPAIR UMBILICAL ADULT, small bowel resection  Antimicrobials:  Subjective: Mild and pain, not flatus or BM   Objective: Filed Vitals:   03/11/16 1752 03/11/16 2104 03/12/16 0150 03/12/16 0607  BP: 108/60 123/61 116/62 112/70  Pulse: 68 74 73 68  Temp: 98.6 F (37 C) 98.5 F (36.9 C) 98.7 F (37.1 C) 97.5 F (36.4 C)  TempSrc: Oral Oral Oral Oral  Resp: 16 14 18 18   Height:       Weight:      SpO2: 100% 98% 95% 96%    Intake/Output Summary (Last 24 hours) at 03/12/16 1007 Last data filed at 03/12/16 [id number]  Gross per 24 hour  Intake 1886.67 ml  Output   2475 ml  Net -588.33 ml   Filed Weights   03/10/16 2127  Weight: 86.9 kg (191 lb 9.3 oz)    Examination:  General exam: Appears calm and comfortable, NGT noted with green bilious drainage Respiratory system: Clear to auscultation. Respiratory effort normal. Cardiovascular system: S1 & S2 heard, RRR. No JVD, murmurs, rubs, gallops or clicks. No pedal edema. Gastrointestinal system: soft, tender as expected, surgical dressing noted Central nervous system: Alert and oriented. No focal neurological deficits. Extremities: Symmetric 5 x 5 power. Skin: No rashes, lesions or ulcers Psychiatry: Judgement and insight appear normal. Mood & affect appropriate.     Data Reviewed: I have personally reviewed following labs and imaging studies  CBC:  Recent Labs Lab 03/10/16 1553 03/11/16 0444 03/12/16 0416  WBC 9.0 13.9* 7.6  NEUTROABS 7.3  --   --   HGB 16.1 16.5 14.1  HCT 44.7 46.6 40.6  MCV 96.5 98.3 98.5  PLT 265 297 [id number]   Basic Metabolic Panel:  Recent Labs Lab 03/10/16 1553 03/11/16 0444 03/12/16 0416  NA 134* 137 138  K 4.4 4.8 5.1  CL 95* 98* 105  CO2 28 29 25   GLUCOSE 119* 141* 122*  BUN 32* 34* 35*  CREATININE 1.27* 1.51* 1.37*  CALCIUM 9.6 9.6 8.5*   GFR: Estimated Creatinine Clearance: 62.9 mL/min (by C-G formula based on Cr of  1.37). Liver Function Tests:  Recent Labs Lab 03/10/16 1553 03/11/16 0444 03/12/16 0416  AST 29 29 18   ALT 30 27 16*  ALKPHOS 144* 144* 82  BILITOT 1.4* 1.8* 2.6*  PROT 8.3* 8.4* 6.2*  ALBUMIN 4.1 4.4 3.0*    Recent Labs Lab 03/10/16 1553  LIPASE 38   No results for input(s): AMMONIA in the last 168 hours. Coagulation Profile:  Recent Labs Lab 03/11/16 0444  INR 1.16   Cardiac Enzymes: No results for input(s): CKTOTAL, CKMB,  CKMBINDEX, TROPONINI in the last 168 hours. BNP (last 3 results) No results for input(s): PROBNP in the last 8760 hours. HbA1C: No results for input(s): HGBA1C in the last 72 hours. CBG: No results for input(s): GLUCAP in the last 168 hours. Lipid Profile: No results for input(s): CHOL, HDL, LDLCALC, TRIG, CHOLHDL, LDLDIRECT in the last 72 hours. Thyroid Function Tests: No results for input(s): TSH, T4TOTAL, FREET4, T3FREE, THYROIDAB in the last 72 hours. Anemia Panel: No results for input(s): VITAMINB12, FOLATE, FERRITIN, TIBC, IRON, RETICCTPCT in the last 72 hours. Urine analysis:    Component Value Date/Time   COLORURINE AMBER* 03/10/2016 1920   APPEARANCEUR CLEAR 03/10/2016 1920   LABSPEC 1.025 03/10/2016 1920   PHURINE 5.5 03/10/2016 1920   GLUCOSEU NEGATIVE 03/10/2016 1920   HGBUR NEGATIVE 03/10/2016 1920   BILIRUBINUR NEGATIVE 03/10/2016 1920   KETONESUR NEGATIVE 03/10/2016 1920   PROTEINUR NEGATIVE 03/10/2016 1920   NITRITE NEGATIVE 03/10/2016 1920   LEUKOCYTESUR NEGATIVE 03/10/2016 1920   Sepsis Labs: @LABRCNTIP (procalcitonin:4,lacticidven:4)  ) Recent Results (from the past 240 hour(s))  Surgical pcr screen     Status: None   Collection Time: 03/11/16  7:23 AM  Result Value Ref Range Status   MRSA, PCR NEGATIVE NEGATIVE Final   Staphylococcus aureus NEGATIVE NEGATIVE Final    Comment:        The Xpert SA Assay (FDA approved for NASAL specimens in patients over 63 years of age), is one component of a comprehensive surveillance program.  Test performance has been validated by [facility] for patients greater than or equal to 1 year old. It is not intended to diagnose infection nor to guide or monitor treatment.          Radiology Studies: Ct Abdomen Pelvis Wo Contrast  03/10/2016  CLINICAL DATA:  Abdominal pain, onset today. Nausea and vomiting. Ventral hernia, question incarcerated. EXAM: CT ABDOMEN AND PELVIS WITHOUT CONTRAST TECHNIQUE:  Multidetector CT imaging of the abdomen and pelvis was performed following the standard protocol without IV contrast. COMPARISON:  Most recent CT 04/13/2015 FINDINGS: Lower chest: The included lung bases are clear. No pleural effusion. Normal heart size. Liver: Nodular contours consistent with cirrhosis. No evidence of focal lesion allowing for lack contrast. Hepatobiliary: Gallbladder physiologically distended, no calcified stone. No biliary dilatation. Pancreas: No ductal dilatation.  No peripancreatic inflammation. Spleen: Upper normal in size.  Scattered granuloma. Adrenal glands: No nodule. Kidneys: No hydronephrosis or urolithiasis. Kidneys symmetric in size. No perinephric stranding. Stomach/Bowel: Stomach distended with ingested contrast. Small amount contrast in the distal esophagus. Dilated fluid-filled small bowel loops with transition point umbilical hernia. Exiting and distal small bowel loops are decompressed. No pneumatosis. Small volume stool throughout the colon. Distal colonic diverticulosis, no evidence of diverticulitis. Appendix is normal. Vascular/Lymphatic: No retroperitoneal adenopathy. Abdominal aorta is normal in caliber. Moderate atherosclerosis without aneurysm. Reproductive: Prostate gland normal in size. Bladder: Physiologically distended. Other: Moderate volume intra-abdominal and pelvic ascites. Minimal ascites in the umbilical hernia. No free  air. No definite loculated fluid collection. Musculoskeletal: There are no acute or suspicious osseous abnormalities. IMPRESSION: 1. Small-bowel obstruction secondary to incarcerated umbilical hernia. 2. Cirrhosis with moderate volume intra-abdominal pelvic ascites. Electronically Signed   By: Jeb Levering M.D.   On: 03/10/2016 18:09        Scheduled Meds:  Continuous Infusions: . sodium chloride 100 mL/hr at 03/12/16 0115     LOS: 2 days    Time spent: 100min    Domenic Polite, MD [facility] Pager  [phone]  If 7PM-7AM, please contact night-coverage [email or web address] Password [email or web address] 03/12/2016, 10:07 AM

## 2016-03-12 NOTE — Progress Notes (Signed)
Initial Nutrition Assessment  DOCUMENTATION CODES:   Non-severe (moderate) malnutrition in context of chronic illness  INTERVENTION:   -Diet advancement per MD -If patient's diet is unable to be advanced, recommend nutrition support. -If diet is advanced, provide Boost Breeze po TID, each supplement provides 250 kcal and 9 grams of protein  RD to continue to monitor for plan.  NUTRITION DIAGNOSIS:   Inadequate oral intake related to inability to eat as evidenced by NPO status.  GOAL:   Patient will meet greater than or equal to 90% of their needs  MONITOR:   Diet advancement, Labs, Weight trends, I & O's  REASON FOR ASSESSMENT:   Malnutrition Screening Tool    ASSESSMENT:   55 y.o. male with medical history significant of cirrhosis of the liver with ascites and grade 1 esophageal varicies. Patient is followed by Dr. Oneida Alar with GI. Cirrhosis is believe to occur in the context of prior EtOH use in the past (non drinking now). He has monthly paracentesis for ascites, otherwise is actually healthy previously. 4/23: s/p diagnostic laparoscopy, open HERNIA REPAIR UMBILICAL ADULT, small bowel resection   Patient with NGT, output: 1050 ml. Pt denies any nausea. Pt currently NPO for SBO and bowel rest. Pt reports eating twice a day, he never eats breakfast, only has coffee in the morning. He states he eats a lot of fish and rice, only drinks water and apple juice. Does not use supplements but is willing to try them once diet is advanced.  Pt reports UBW of 280 lb over a year ago, he lost 100 lb and since September 2016 his weight has remained stable around 190 lb.  Nutrition-Focused physical exam completed. Findings are mild fat depletion, severe muscle depletion, and no edema.   Labs reviewed. Medications reviewed.  Diet Order:  Diet NPO time specified Except for: Ice Chips, Sips with Meds  Skin:  Wound (see comment) (closed abdominal incision)  Last BM:  PTA  Height:    Ht Readings from Last 1 Encounters:  03/10/16 5\' 10"  (1.778 m)    Weight:   Wt Readings from Last 1 Encounters:  03/10/16 191 lb 9.3 oz (86.9 kg)    Ideal Body Weight:  75.5 kg  BMI:  Body mass index is 27.49 kg/(m^2).  Estimated Nutritional Needs:   Kcal:  2400-2600  Protein:  115-125g  Fluid:  2L/day  EDUCATION NEEDS:   No education needs identified at this time  Clayton Bibles, MS, RD, LDN Pager: [phone] After Hours Pager: [phone]

## 2016-03-13 ENCOUNTER — Ambulatory Visit: Payer: Self-pay | Admitting: Gastroenterology

## 2016-03-13 DIAGNOSIS — E440 Moderate protein-calorie malnutrition: Secondary | ICD-10-CM

## 2016-03-13 NOTE — Progress Notes (Signed)
PROGRESS NOTE    Kevin Nicholson  [id number] DOB: [birth year] DOA: 03/10/2016 PCP: Minerva Ends, MD Outpatient Specialists: Kevin Nicholson  Brief Narrative: Kevin Nicholson is a 55 y.o. male with medical history significant of cirrhosis of the liver with ascites and grade 1 esophageal varicies. Patient is followed by Dr. Oneida Alar with GI. Cirrhosis felt to be EtOH etiology (not drinking now). He has monthly paracentesis for ascites. Patient presented to the ED with c/o N/V, abdominal pain, loose stool. Symptoms started 4/22 pm  hernia is found to be painful, hard, non-reducible. CT scan confirmed obstruction due to incarcerated umbilical hernia [id number]: NGT placed, [facility]CS consulted 4/23: diagnostic laparoscopy, open HERNIA REPAIR UMBILICAL ADULT, small bowel resection Now with post op ileus-NPo, NGT  Assessment & Plan:  Incarcerated umbilical hernia causing SBO  -didn't improve with NPO, NG decompression -s/p open hernia repair and SB resection 4/23  -now with post op ileus, having large amount of NG output, + flatus and + bowel sounds -per CCS -continue IVF, supportive care, NGT -Ambulate, Incentive spirometry  Alcoholic cirrhosis of liver - -MELD score of 14 -Child class B due to moderate ascites and grade 1 varices -required monthly paracentesis, until diuretic dose increased, last 2-14months ago -diuretics and nadolol on hold while NPO   Tobacco use -suspect undiagnosed COPD -counseled, nebs PRN  DVT prophylaxis:  lovenox Code Status: Full Family Communication: No family in room DIsposition: Home when cleared by CCS  Consultants: [facility] Dr.Thomas  Procedures:PROCEDURE: diagnostic laparoscopy, open HERNIA REPAIR UMBILICAL ADULT, small bowel resection  Antimicrobials:  Subjective: Mild and pain, not flatus or BM   Objective: Filed Vitals:   03/12/16 0607 03/12/16 1340 03/12/16 2047 03/13/16 0508  BP: 112/70 136/47 119/67 109/57  Pulse: 68 71 73 73  Temp: 97.5 F  (36.4 C)  98.4 F (36.9 C) 98.3 F (36.8 C)  TempSrc: Oral  Axillary   Resp: 18  16 16   Height:      Weight:      SpO2: 96% 99% 100% 99%    Intake/Output Summary (Last 24 hours) at 03/13/16 1055 Last data filed at 03/13/16 1000  Gross per 24 hour  Intake 2071.67 ml  Output   4975 ml  Net -2903.33 ml   Filed Weights   03/10/16 2127  Weight: 86.9 kg (191 lb 9.3 oz)    Examination:  General exam: Appears calm and comfortable, NGT noted with brown drainage Respiratory system: diminished at bases, rest clear Cardiovascular system: S1 & S2 heard, RRR. No JVD, murmurs, rubs, gallops or clicks. No pedal edema. Gastrointestinal system: soft, tender as expected, surgical dressing noted, bowel sounds present Central nervous system: Alert and oriented. No focal neurological deficits. Extremities: Symmetric 5 x 5 power. Skin: No rashes, lesions or ulcers Psychiatry: Judgement and insight appear normal. Mood & affect appropriate.     Data Reviewed: I have personally reviewed following labs and imaging studies  CBC:  Recent Labs Lab 03/10/16 1553 03/11/16 0444 03/12/16 0416  WBC 9.0 13.9* 7.6  NEUTROABS 7.3  --   --   HGB 16.1 16.5 14.1  HCT 44.7 46.6 40.6  MCV 96.5 98.3 98.5  PLT 265 297 [id number]   Basic Metabolic Panel:  Recent Labs Lab 03/10/16 1553 03/11/16 0444 03/12/16 0416  NA 134* 137 138  K 4.4 4.8 5.1  CL 95* 98* 105  CO2 28 29 25   GLUCOSE 119* 141* 122*  BUN 32* 34* 35*  CREATININE 1.27* 1.51* 1.37*  CALCIUM 9.6  9.6 8.5*   GFR: Estimated Creatinine Clearance: 62.9 mL/min (by C-G formula based on Cr of 1.37). Liver Function Tests:  Recent Labs Lab 03/10/16 1553 03/11/16 0444 03/12/16 0416  AST 29 29 18   ALT 30 27 16*  ALKPHOS 144* 144* 82  BILITOT 1.4* 1.8* 2.6*  PROT 8.3* 8.4* 6.2*  ALBUMIN 4.1 4.4 3.0*    Recent Labs Lab 03/10/16 1553  LIPASE 38   No results for input(s): AMMONIA in the last 168 hours. Coagulation Profile:  Recent  Labs Lab 03/11/16 0444  INR 1.16   Cardiac Enzymes: No results for input(s): CKTOTAL, CKMB, CKMBINDEX, TROPONINI in the last 168 hours. BNP (last 3 results) No results for input(s): PROBNP in the last 8760 hours. HbA1C: No results for input(s): HGBA1C in the last 72 hours. CBG: No results for input(s): GLUCAP in the last 168 hours. Lipid Profile: No results for input(s): CHOL, HDL, LDLCALC, TRIG, CHOLHDL, LDLDIRECT in the last 72 hours. Thyroid Function Tests: No results for input(s): TSH, T4TOTAL, FREET4, T3FREE, THYROIDAB in the last 72 hours. Anemia Panel: No results for input(s): VITAMINB12, FOLATE, FERRITIN, TIBC, IRON, RETICCTPCT in the last 72 hours. Urine analysis:    Component Value Date/Time   COLORURINE AMBER* 03/10/2016 1920   APPEARANCEUR CLEAR 03/10/2016 1920   LABSPEC 1.025 03/10/2016 1920   PHURINE 5.5 03/10/2016 1920   GLUCOSEU NEGATIVE 03/10/2016 1920   HGBUR NEGATIVE 03/10/2016 1920   BILIRUBINUR NEGATIVE 03/10/2016 1920   KETONESUR NEGATIVE 03/10/2016 1920   PROTEINUR NEGATIVE 03/10/2016 1920   NITRITE NEGATIVE 03/10/2016 1920   LEUKOCYTESUR NEGATIVE 03/10/2016 1920   Sepsis Labs: @LABRCNTIP (procalcitonin:4,lacticidven:4)  ) Recent Results (from the past 240 hour(s))  Surgical pcr screen     Status: None   Collection Time: 03/11/16  7:23 AM  Result Value Ref Range Status   MRSA, PCR NEGATIVE NEGATIVE Final   Staphylococcus aureus NEGATIVE NEGATIVE Final    Comment:        The Xpert SA Assay (FDA approved for NASAL specimens in patients over 46 years of age), is one component of a comprehensive surveillance program.  Test performance has been validated by [facility] for patients greater than or equal to 97 year old. It is not intended to diagnose infection nor to guide or monitor treatment.          Radiology Studies: No results found.      Scheduled Meds: . enoxaparin (LOVENOX) injection  40 mg Subcutaneous Q24H    Continuous Infusions: . sodium chloride 100 mL/hr (03/13/16 0619)     LOS: 3 days    Time spent: 59min    Domenic Polite, MD [facility] Pager [phone]  If 7PM-7AM, please contact night-coverage [email or web address] Password [facility] 03/13/2016, 10:55 AM

## 2016-03-13 NOTE — Progress Notes (Signed)
Patient ID: CODA MATHEY, male   DOB: 02/16/1961, 55 y.o.   MRN: [id number]     [address] [place], [place] [address]    Phone: [phone] FAX: [phone]     Subjective: Passing flatus since last night.  Ambulating. Voiding. No n/v.  2959m ngt output recorded, taking in lots of liquids but unclear how much.   Objective:  Vital signs:  Filed Vitals:   03/12/16 0607 03/12/16 1340 03/12/16 2047 03/13/16 0508  BP: 112/70 136/47 119/67 109/57  Pulse: 68 71 73 73  Temp: 97.5 F (36.4 C)  98.4 F (36.9 C) 98.3 F (36.8 C)  TempSrc: Oral  Axillary   Resp: _0 Height:      Weight:      SpO2: 96% 99% 100% 99%    Last BM Date: 03/10/16  Intake/Output   Yesterday:  04/24 0701 - 04/25 0700 In: 1231.7 [I.V.:1231.7] Out: 3800 [Urine:900; Emesis/NG output:2900] This shift:  Total I/O In: -  Out: 1400 [Urine:200; Emesis/NG output:1200]   Physical Exam: General: Pt awake/alert/oriented x4 in no acute distress  Abdomen: Soft. Nondistended. Mildly tender at incisions only.dressing removed, incision without erythema, edges are approximated. bowel sounds are present. No evidence of peritonitis. No incarcerated hernias.  Problem List:   Principal Problem:   SBO (small bowel obstruction) (HCC) Active Problems:   Alcoholic cirrhosis of liver with ascites (HCC)   Incarcerated umbilical hernia    Results:   Labs: Results for orders placed or performed during the hospital encounter of 03/10/16 (from the past 48 hour(s))  CBC     Status: Abnormal   Collection Time: 03/12/16  4:16 AM  Result Value Ref Range   WBC 7.6 4.0 - 10.5 K/uL   RBC 4.12 (L) 4.22 - 5.81 MIL/uL   Hemoglobin 14.1 13.0 - 17.0 g/dL   HCT 40.6 39.0 - 52.0 %   MCV 98.5 78.0 - 100.0 fL   MCH 34.2 (H) 26.0 - 34.0 pg   MCHC 34.7 30.0 - 36.0 g/dL   RDW 13.7 11.5 - 15.5 %   Platelets 217 150 - 400 K/uL  Comprehensive  metabolic panel     Status: Abnormal   Collection Time: 03/12/16  4:16 AM  Result Value Ref Range   Sodium 138 135 - 145 mmol/L   Potassium 5.1 3.5 - 5.1 mmol/L   Chloride 105 101 - 111 mmol/L   CO2 25 22 - 32 mmol/L   Glucose, Bld 122 (H) 65 - 99 mg/dL   BUN 35 (H) 6 - 20 mg/dL   Creatinine, Ser 1.37 (H) 0.61 - 1.24 mg/dL   Calcium 8.5 (L) 8.9 - 10.3 mg/dL   Total Protein 6.2 (L) 6.5 - 8.1 g/dL   Albumin 3.0 (L) 3.5 - 5.0 g/dL   AST 18 15 - 41 U/L   ALT 16 (L) 17 - 63 U/L   Alkaline Phosphatase 82 38 - 126 U/L   Total Bilirubin 2.6 (H) 0.3 - 1.2 mg/dL   GFR calc non Af Amer 57 (L) >60 mL/min   GFR calc Af Amer >60 >60 mL/min    Comment: (NOTE) The eGFR has been calculated using the CKD EPI equation. This calculation has not been validated in all clinical situations. eGFR's persistently <60 mL/min signify possible Chronic Kidney Disease.    Anion gap 8 5 - 15    Imaging / Studies: No results found.  Medications / Allergies:  Scheduled Meds: . enoxaparin (LOVENOX) injection  40 mg Subcutaneous Q24H   Continuous Infusions: . sodium chloride 100 mL/hr (03/13/16 0619)   PRN Meds:.HYDROmorphone (DILAUDID) injection, morphine injection, ondansetron (ZOFRAN) IV  Antibiotics: Anti-infectives    Start     Dose/Rate Route Frequency Ordered Stop   03/11/16 1015  cefOXitin (MEFOXIN) 2 g in dextrose 5 % 50 mL IVPB     2 g 100 mL/hr over 30 Minutes Intravenous  Once 03/11/16 1011 03/11/16 0915       Assessment/Plan Strangulated umbilical hernia POD#2 open umbilical hernia repair, SBR---Dr. Marcello Moores -accurate I&Os, NGT today, will consider clamping tomorrow if he continues to have flatus. -mobilize, IS FEN-IVF, pain control VTE prophylaxis-lovenox, SCDs Cirrhosis of liver/Etoh abuse   Erby Pian, [employer] Pager [phone](7A-4:30P)   03/13/2016 8:39 AM

## 2016-03-13 NOTE — Progress Notes (Signed)
Pt sneezed and NG tube accidentally came out. Pt reluctant to have it replaced. Call to [facility] PA who agrees to leave tube out unless pt gets nauseated or distended, which would neccessitate tube being replaced. Will continue to monitor pt for symptoms.

## 2016-03-14 LAB — CBC
HCT: 32.9 % — ABNORMAL LOW (ref 39.0–52.0)
Hemoglobin: 11.4 g/dL — ABNORMAL LOW (ref 13.0–17.0)
MCH: 34.5 pg — ABNORMAL HIGH (ref 26.0–34.0)
MCHC: 34.7 g/dL (ref 30.0–36.0)
MCV: 99.7 fL (ref 78.0–100.0)
Platelets: 171 10*3/uL (ref 150–400)
RBC: 3.3 MIL/uL — ABNORMAL LOW (ref 4.22–5.81)
RDW: 13.6 % (ref 11.5–15.5)
WBC: 3.7 10*3/uL — AB (ref 4.0–10.5)

## 2016-03-14 LAB — BASIC METABOLIC PANEL
Anion gap: 10 (ref 5–15)
BUN: 31 mg/dL — ABNORMAL HIGH (ref 6–20)
CO2: 26 mmol/L (ref 22–32)
Calcium: 8.1 mg/dL — ABNORMAL LOW (ref 8.9–10.3)
Chloride: 104 mmol/L (ref 101–111)
Creatinine, Ser: 1 mg/dL (ref 0.61–1.24)
Glucose, Bld: 91 mg/dL (ref 65–99)
Potassium: 4.1 mmol/L (ref 3.5–5.1)
SODIUM: 140 mmol/L (ref 135–145)

## 2016-03-14 MED ORDER — PANTOPRAZOLE SODIUM 40 MG PO TBEC
40.0000 mg | DELAYED_RELEASE_TABLET | Freq: Every day | ORAL | Status: DC
  Administered 2016-03-15 – 2016-03-20 (×6): 40 mg via ORAL
  Filled 2016-03-14 (×10): qty 1

## 2016-03-14 MED ORDER — FUROSEMIDE 40 MG PO TABS
40.0000 mg | ORAL_TABLET | Freq: Two times a day (BID) | ORAL | Status: DC
  Administered 2016-03-14 – 2016-03-17 (×6): 40 mg via ORAL
  Filled 2016-03-14 (×8): qty 1

## 2016-03-14 MED ORDER — NADOLOL 20 MG PO TABS
20.0000 mg | ORAL_TABLET | Freq: Every day | ORAL | Status: DC
  Administered 2016-03-15 – 2016-03-17 (×3): 20 mg via ORAL
  Filled 2016-03-14 (×3): qty 1

## 2016-03-14 MED ORDER — SPIRONOLACTONE 100 MG PO TABS
100.0000 mg | ORAL_TABLET | Freq: Two times a day (BID) | ORAL | Status: DC
  Administered 2016-03-14 – 2016-03-17 (×6): 100 mg via ORAL
  Filled 2016-03-14 (×8): qty 1

## 2016-03-14 MED ORDER — OXYCODONE HCL 5 MG PO TABS
5.0000 mg | ORAL_TABLET | Freq: Four times a day (QID) | ORAL | Status: DC | PRN
  Administered 2016-03-14 (×2): 5 mg via ORAL
  Administered 2016-03-15 – 2016-03-21 (×8): 10 mg via ORAL
  Filled 2016-03-14: qty 1
  Filled 2016-03-14: qty 2
  Filled 2016-03-14: qty 1
  Filled 2016-03-14 (×7): qty 2

## 2016-03-14 MED ORDER — SODIUM CHLORIDE 0.9% FLUSH: 3.0000 mL | INTRAVENOUS | Status: DC | PRN

## 2016-03-14 MED ORDER — SODIUM CHLORIDE 0.9% FLUSH
3.0000 mL | Freq: Two times a day (BID) | INTRAVENOUS | Status: DC
Start: 2016-03-14 — End: 2016-03-21
  Administered 2016-03-14 – 2016-03-20 (×8): 3 mL via INTRAVENOUS

## 2016-03-14 NOTE — Progress Notes (Signed)
Patient ID: Kevin Nicholson, male   DOB: June 10, 1961, 55 y.o.   MRN: [id number]     [facility]      [address] [place], [place] [address]    Phone: [phone] FAX: [phone]     Subjective: ngt pulled out yesterday.  Had several BMs today. Ambulating.  Little pain with movement. No n/v.  Afebrile.  VSS.   Objective:  Vital signs:  Filed Vitals:   03/13/16 0508 03/13/16 1509 03/13/16 2100 03/14/16 0515  BP: 109/57 113/65 105/65 109/57  Pulse: 73 81 77 83  Temp: 98.3 F (36.8 C) 97.7 F (36.5 C) 97.8 F (36.6 C) 98.4 F (36.9 C)  TempSrc:  Oral Oral Oral  Resp: '16 15 15 17  '$ Height:      Weight:      SpO2: 99% 100% 100% 100%    Last BM Date: 03/14/16  Intake/Output   Yesterday:  04/25 0701 - 04/26 0700 In: 3448.3 [P.O.:1080; I.V.:2368.3] Out: 2775 [Urine:675; Emesis/NG output:2100] This shift:    I/O last 3 completed shifts: In: 3716 [P.O.:1080; I.V.:3600] Out: 4900 [Urine:1050; Emesis/NG output:3850]     Physical Exam: General: Pt awake/alert/oriented x4 in no acute distress  Abdomen: Soft. Nondistended. Mildly tender at incisions only, incision without erythema, edges are approximated. bowel sounds are present. No evidence of peritonitis. No incarcerated hernias.   Problem List:   Principal Problem:   SBO (small bowel obstruction) (HCC) Active Problems:   Alcoholic cirrhosis of liver with ascites (HCC)   Incarcerated umbilical hernia   Malnutrition of moderate degree    Results:   Labs: Results for orders placed or performed during the hospital encounter of 03/10/16 (from the past 48 hour(s))  CBC     Status: Abnormal   Collection Time: 03/14/16  4:31 AM  Result Value Ref Range   WBC 3.7 (L) 4.0 - 10.5 K/uL   RBC 3.30 (L) 4.22 - 5.81 MIL/uL   Hemoglobin 11.4 (L) 13.0 - 17.0 g/dL   HCT 32.9 (L) 39.0 - 52.0 %   MCV 99.7 78.0 - 100.0 fL   MCH 34.5 (H) 26.0 - 34.0 pg   MCHC 34.7 30.0 -  36.0 g/dL   RDW 13.6 11.5 - 15.5 %   Platelets 171 150 - 400 K/uL  Basic metabolic panel     Status: Abnormal   Collection Time: 03/14/16  4:31 AM  Result Value Ref Range   Sodium 140 135 - 145 mmol/L   Potassium 4.1 3.5 - 5.1 mmol/L   Chloride 104 101 - 111 mmol/L   CO2 26 22 - 32 mmol/L   Glucose, Bld 91 65 - 99 mg/dL   BUN 31 (H) 6 - 20 mg/dL   Creatinine, Ser 1.00 0.61 - 1.24 mg/dL   Calcium 8.1 (L) 8.9 - 10.3 mg/dL   GFR calc non Af Amer >60 >60 mL/min   GFR calc Af Amer >60 >60 mL/min    Comment: (NOTE) The eGFR has been calculated using the CKD EPI equation. This calculation has not been validated in all clinical situations. eGFR's persistently <60 mL/min signify possible Chronic Kidney Disease.    Anion gap 10 5 - 15    Imaging / Studies: No results found.  Medications / Allergies:  Scheduled Meds: . enoxaparin (LOVENOX) injection  40 mg Subcutaneous Q24H   Continuous Infusions: . sodium chloride 100 mL/hr (03/14/16 0253)   PRN Meds:.HYDROmorphone (DILAUDID) injection, morphine injection, ondansetron (ZOFRAN) IV  Antibiotics:  Anti-infectives    Start     Dose/Rate Route Frequency Ordered Stop   03/11/16 1015  cefOXitin (MEFOXIN) 2 g in dextrose 5 % 50 mL IVPB     2 g 100 mL/hr over 30 Minutes Intravenous  Once 03/11/16 1011 03/11/16 0915         Assessment/Plan Strangulated umbilical hernia POD#3 open umbilical hernia repair, SBR---Dr. Marcello Moores -clears for lunch, fulls for dinner if tolerated -mobilize, IS FEN-clears, add po pain meds  VTE prophylaxis-lovenox, SCDs Cirrhosis of liver/Etoh abuse-recommend resuming home meds  Dispo-anticipate he will be surgically ready for DC in AM   Erby Pian, [facility] Pager [phone](7A-4:30P)   03/14/2016

## 2016-03-14 NOTE — Progress Notes (Signed)
PROGRESS NOTE  Kevin Nicholson [id number] DOB: 10-28-1961 DOA: 03/10/2016 PCP: Minerva Ends, MD Outpatient Specialists:  Dr. Enzo Montgomery  Brief History:  55 y.o. male with medical history significant of cirrhosis of the liver with ascites and grade 1 esophageal varicies. Patient is followed by Dr. Oneida Alar with GI. Cirrhosis felt to be EtOH etiology (not drinking now). He has monthly paracentesis for ascites. Patient presented to the ED with c/o N/V, abdominal pain, loose stool that started [id number] pm.  Umbilical hernia is found to be painful, hard, non-reducible. CT scan confirmed obstruction due to incarcerated umbilical hernia [id number]: NGT placed, CCS consulted 4/23: diagnostic laparoscopy, open HERNIA REPAIR UMBILICAL, small bowel resection.  Postoperatively, the patient had a postoperative ileus which required NG tube decompression. The patient accidentally sneezed out the NG tube on 03/13/2016. Fortunately, it appears that his ileus has resolved as the patient has had numerous bowel movements.   Assessment/Plan: Incarcerated umbilical hernia causing SBO  -didn't improve with NPO, NG decompression -s/p open hernia repair and SB resection 4/23  -Patient developed postoperative ileus requiring NG tube decompression--> improved -Advancing diet per CCS -Ambulate, Incentive spirometry  Alcoholic cirrhosis of liver - -MELD score of 14 -Child class B due to moderate ascites and grade 1 varices -required monthly paracentesis, until diuretic dose increased, last 2-74months ago -Restart furosemide and spironolactone -restart nadolol  Tobacco use -suspect undiagnosed COPD -counseled, nebs PRN  Moderate malnutrition  DVT prophylaxis: lovenox Code Status: Full Family Communication: No family in room  Consultants: CCS Dr.Thomas  Procedures:PROCEDURE: diagnostic laparoscopy, open HERNIA REPAIR UMBILICAL ADULT, small bowel resection    Disposition Plan:   Home  when cleared by CCS    Subjective: Patient has some dyspnea on exertion but denies any fevers, chills, chest pain, nausea, vomiting, diarrhea. He is having bowel movements. Abdominal pain is controlled. Denies any dysuria or hematuria.  Objective: Filed Vitals:   03/13/16 1509 03/13/16 2100 03/14/16 0515 03/14/16 1342  BP: 113/65 105/65 109/57 124/65  Pulse: 81 77 83 76  Temp: 97.7 F (36.5 C) 97.8 F (36.6 C) 98.4 F (36.9 C) 99 F (37.2 C)  TempSrc: Oral Oral Oral Oral  Resp: 15 15 17 17   Height:      Weight:      SpO2: 100% 100% 100% 100%    Intake/Output Summary (Last 24 hours) at 03/14/16 1720 Last data filed at 03/14/16 1027  Gross per 24 hour  Intake   1600 ml  Output    575 ml  Net   1025 ml   Weight change:  Exam:   General:  Pt is alert, follows commands appropriately, not in acute distress  HEENT: No icterus, No thrush, No neck mass, [place]/AT  Cardiovascular: RRR, S1/S2, no rubs, no gallops  Respiratory: Diminished breath sounds at the bases. No wheezing. Good air movement.  Abdomen: Soft/+BS, non tender, non distended, no guarding; no hepatosplenomegaly  Extremities: trace LE  edema, No lymphangitis, No petechiae, No rashes, no synovitis   Data Reviewed: I have personally reviewed following labs and imaging studies Basic Metabolic Panel:  Recent Labs Lab 03/10/16 1553 03/11/16 0444 03/12/16 0416 03/14/16 0431  NA 134* 137 138 140  K 4.4 4.8 5.1 4.1  CL 95* 98* 105 104  CO2 28 29 25 26   GLUCOSE 119* 141* 122* 91  BUN 32* 34* 35* 31*  CREATININE 1.27* 1.51* 1.37* 1.00  CALCIUM 9.6 9.6 8.5* 8.1*   Liver  Function Tests:  Recent Labs Lab 03/10/16 1553 03/11/16 0444 03/12/16 0416  AST 29 29 18   ALT 30 27 16*  ALKPHOS 144* 144* 82  BILITOT 1.4* 1.8* 2.6*  PROT 8.3* 8.4* 6.2*  ALBUMIN 4.1 4.4 3.0*    Recent Labs Lab 03/10/16 1553  LIPASE 38   No results for input(s): AMMONIA in the last 168 hours. Coagulation Profile:  Recent  Labs Lab 03/11/16 0444  INR 1.16   CBC:  Recent Labs Lab 03/10/16 1553 03/11/16 0444 03/12/16 0416 03/14/16 0431  WBC 9.0 13.9* 7.6 3.7*  NEUTROABS 7.3  --   --   --   HGB 16.1 16.5 14.1 11.4*  HCT 44.7 46.6 40.6 32.9*  MCV 96.5 98.3 98.5 99.7  PLT 265 297 217 171   Cardiac Enzymes: No results for input(s): CKTOTAL, CKMB, CKMBINDEX, TROPONINI in the last 168 hours. BNP: Invalid input(s): POCBNP CBG: No results for input(s): GLUCAP in the last 168 hours. HbA1C: No results for input(s): HGBA1C in the last 72 hours. Urine analysis:    Component Value Date/Time   COLORURINE AMBER* 03/10/2016 1920   APPEARANCEUR CLEAR 03/10/2016 1920   LABSPEC 1.025 03/10/2016 1920   PHURINE 5.5 03/10/2016 1920   GLUCOSEU NEGATIVE 03/10/2016 1920   HGBUR NEGATIVE 03/10/2016 1920   BILIRUBINUR NEGATIVE 03/10/2016 1920   KETONESUR NEGATIVE 03/10/2016 1920   PROTEINUR NEGATIVE 03/10/2016 1920   NITRITE NEGATIVE 03/10/2016 1920   LEUKOCYTESUR NEGATIVE 03/10/2016 1920   Sepsis Labs: @LABRCNTIP (procalcitonin:4,lacticidven:4) ) Recent Results (from the past 240 hour(s))  Surgical pcr screen     Status: None   Collection Time: 03/11/16  7:23 AM  Result Value Ref Range Status   MRSA, PCR NEGATIVE NEGATIVE Final   Staphylococcus aureus NEGATIVE NEGATIVE Final    Comment:        The Xpert SA Assay (FDA approved for NASAL specimens in patients over 13 years of age), is one component of a comprehensive surveillance program.  Test performance has been validated by [facility] for patients greater than or equal to 37 year old. It is not intended to diagnose infection nor to guide or monitor treatment.      Scheduled Meds: . enoxaparin (LOVENOX) injection  40 mg Subcutaneous Q24H  . furosemide  40 mg Oral BID  . sodium chloride flush  3 mL Intravenous Q12H  . spironolactone  100 mg Oral BID   Continuous Infusions:   Procedures/Studies: Ct Abdomen Pelvis Wo Contrast  03/10/2016   CLINICAL DATA:  Abdominal pain, onset today. Nausea and vomiting. Ventral hernia, question incarcerated. EXAM: CT ABDOMEN AND PELVIS WITHOUT CONTRAST TECHNIQUE: Multidetector CT imaging of the abdomen and pelvis was performed following the standard protocol without IV contrast. COMPARISON:  Most recent CT 04/13/2015 FINDINGS: Lower chest: The included lung bases are clear. No pleural effusion. Normal heart size. Liver: Nodular contours consistent with cirrhosis. No evidence of focal lesion allowing for lack contrast. Hepatobiliary: Gallbladder physiologically distended, no calcified stone. No biliary dilatation. Pancreas: No ductal dilatation.  No peripancreatic inflammation. Spleen: Upper normal in size.  Scattered granuloma. Adrenal glands: No nodule. Kidneys: No hydronephrosis or urolithiasis. Kidneys symmetric in size. No perinephric stranding. Stomach/Bowel: Stomach distended with ingested contrast. Small amount contrast in the distal esophagus. Dilated fluid-filled small bowel loops with transition point umbilical hernia. Exiting and distal small bowel loops are decompressed. No pneumatosis. Small volume stool throughout the colon. Distal colonic diverticulosis, no evidence of diverticulitis. Appendix is normal. Vascular/Lymphatic: No retroperitoneal adenopathy. Abdominal aorta is  normal in caliber. Moderate atherosclerosis without aneurysm. Reproductive: Prostate gland normal in size. Bladder: Physiologically distended. Other: Moderate volume intra-abdominal and pelvic ascites. Minimal ascites in the umbilical hernia. No free air. No definite loculated fluid collection. Musculoskeletal: There are no acute or suspicious osseous abnormalities. IMPRESSION: 1. Small-bowel obstruction secondary to incarcerated umbilical hernia. 2. Cirrhosis with moderate volume intra-abdominal pelvic ascites. Electronically Signed   By: Jeb Levering M.D.   On: 03/10/2016 18:09    Ernestine Langworthy, DO  [facility] Pager  [phone]  If 7PM-7AM, please contact night-coverage [email or web address] Password [email or web address] 03/14/2016, 5:20 PM   LOS: 4 days

## 2016-03-15 LAB — BASIC METABOLIC PANEL
ANION GAP: 11 (ref 5–15)
BUN: 27 mg/dL — ABNORMAL HIGH (ref 6–20)
CHLORIDE: 99 mmol/L — AB (ref 101–111)
CO2: 24 mmol/L (ref 22–32)
Calcium: 8.2 mg/dL — ABNORMAL LOW (ref 8.9–10.3)
Creatinine, Ser: 0.94 mg/dL (ref 0.61–1.24)
GFR calc Af Amer: >60 mL/min (ref >60)
GFR calc non Af Amer: >60 mL/min (ref >60)
GLUCOSE: 126 mg/dL — AB (ref 65–99)
POTASSIUM: 3.7 mmol/L (ref 3.5–5.1)
Sodium: 134 mmol/L — ABNORMAL LOW (ref 135–145)

## 2016-03-15 LAB — MAGNESIUM: Magnesium: 1.8 mg/dL (ref 1.7–2.4)

## 2016-03-15 MED ORDER — BOOST / RESOURCE BREEZE PO LIQD
1.0000 | Freq: Three times a day (TID) | ORAL | Status: DC
  Administered 2016-03-15 – 2016-03-16 (×2): 1 via ORAL

## 2016-03-15 NOTE — Progress Notes (Signed)
PROGRESS NOTE  Kevin Nicholson [id number] DOB: 1961-10-22 DOA: 03/10/2016 PCP: Kevin Nicholson Outpatient Specialists:  Dr. Earma Nicholson  Brief History:  55 y.o. male with medical history significant of cirrhosis of the liver with ascites and grade 1 esophageal varicies. Patient is followed by Dr. Oneida Nicholson with GI. Cirrhosis felt to be EtOH etiology (not drinking now). He has monthly paracentesis for ascites. Patient presented to the ED with c/o N/V, abdominal pain, loose stool that started [id number] pm.  Umbilical hernia is found to be painful, hard, non-reducible. CT scan confirmed obstruction due to incarcerated umbilical hernia [id number]: NGT placed, [facility]CS consulted 4/23: diagnostic laparoscopy, open HERNIA REPAIR UMBILICAL, small bowel resection. Postoperatively, the patient had a postoperative ileus which required NG tube decompression. The patient accidentally sneezed out the NG tube on 03/13/2016. Fortunately, it appears that his ileus has resolved as the patient has had numerous bowel movements.   Assessment/Plan: Incarcerated umbilical hernia causing SBO  -didn't improve with NPO, NG decompression -s/p open hernia repair and SB resection 03/11/16  -Patient developed postoperative ileus requiring NG tube decompression--> improved -03/15/16--abd slightly more distended and feeling nausea--may need to hold diuretics again if not improving -Advancing diet per CCS -Ambulate, Incentive spirometry  Alcoholic cirrhosis of liver  -MELD score of 14 -Child class B due to moderate ascites and grade 1 varices -required monthly paracentesis, until diuretic dose increased, last 2-22months ago -03/14/16--Restarted furosemide and spironolactone -03/15/16-restart nadolol  Tobacco use -suspect undiagnosed COPD -counseled, nebs PRN  Moderate malnutrition  DVT prophylaxis: lovenox Code Status: Full Family Communication: No family in room  Consultants: [facility]  Dr.Thomas  Procedures:PROCEDURE: diagnostic laparoscopy, open HERNIA REPAIR UMBILICAL ADULT, small bowel resection    Disposition Plan: Home when cleared by [facility]  Subjective: Patient is feeling nauseous today. No emesis but feels like his abdomen is a little bit more distended. Denies any fevers, chills, chest pain, short of breath, dysuria, hematuria. He is passing flatus but no bowel movement today.  Objective: Filed Vitals:   03/14/16 1342 03/14/16 2112 03/15/16 0535 03/15/16 1409  BP: 124/65 137/69 113/53 112/70  Pulse: 76 97 100 87  Temp: 99 F (37.2 C) 99.4 F (37.4 C) 98.6 F (37 C) 98.4 F (36.9 C)  TempSrc: Oral Oral Oral Oral  Resp: 17 18 17 18   Height:      Weight:      SpO2: 100% 100% 93% 99%    Intake/Output Summary (Last 24 hours) at 03/15/16 1651 Last data filed at 03/15/16 1409  Gross per 24 hour  Intake      0 ml  Output    300 ml  Net   -300 ml   Weight change:  Exam:   General:  Pt is alert, follows commands appropriately, not in acute distress  HEENT: No icterus, No thrush, No neck mass, [place]/AT  Cardiovascular: RRR, S1/S2, no rubs, no gallops  Respiratory: Diminished breath sounds but clear to auscultation. No wheezing.  Abdomen: Soft/+BS, non tender, mildly distended, no guarding  Extremities: trace LE edema, No lymphangitis, No petechiae, No rashes, no synovitis   Data Reviewed: I have personally reviewed following labs and imaging studies Basic Metabolic Panel:  Recent Labs Lab 03/10/16 1553 03/11/16 0444 03/12/16 0416 03/14/16 0431 03/15/16 0413  NA 134* 137 138 140 134*  K 4.4 4.8 5.1 4.1 3.7  CL 95* 98* 105 104 99*  CO2 28 29 25 26 24   GLUCOSE 119* 141*  122* 91 126*  BUN 32* 34* 35* 31* 27*  CREATININE 1.27* 1.51* 1.37* 1.00 0.94  CALCIUM 9.6 9.6 8.5* 8.1* 8.2*  MG  --   --   --   --  1.8   Liver Function Tests:  Recent Labs Lab 03/10/16 1553 03/11/16 0444 03/12/16 0416  AST 29 29 18   ALT 30 27 16*  ALKPHOS  144* 144* 82  BILITOT 1.4* 1.8* 2.6*  PROT 8.3* 8.4* 6.2*  ALBUMIN 4.1 4.4 3.0*    Recent Labs Lab 03/10/16 1553  LIPASE 38   No results for input(s): AMMONIA in the last 168 hours. Coagulation Profile:  Recent Labs Lab 03/11/16 0444  INR 1.16   CBC:  Recent Labs Lab 03/10/16 1553 03/11/16 0444 03/12/16 0416 03/14/16 0431  WBC 9.0 13.9* 7.6 3.7*  NEUTROABS 7.3  --   --   --   HGB 16.1 16.5 14.1 11.4*  HCT 44.7 46.6 40.6 32.9*  MCV 96.5 98.3 98.5 99.7  PLT 265 297 217 171   Cardiac Enzymes: No results for input(s): CKTOTAL, CKMB, CKMBINDEX, TROPONINI in the last 168 hours. BNP: Invalid input(s): POCBNP CBG: No results for input(s): GLUCAP in the last 168 hours. HbA1C: No results for input(s): HGBA1C in the last 72 hours. Urine analysis:    Component Value Date/Time   COLORURINE AMBER* 03/10/2016 1920   APPEARANCEUR CLEAR 03/10/2016 1920   LABSPEC 1.025 03/10/2016 1920   PHURINE 5.5 03/10/2016 1920   GLUCOSEU NEGATIVE 03/10/2016 1920   HGBUR NEGATIVE 03/10/2016 1920   BILIRUBINUR NEGATIVE 03/10/2016 1920   KETONESUR NEGATIVE 03/10/2016 1920   PROTEINUR NEGATIVE 03/10/2016 1920   NITRITE NEGATIVE 03/10/2016 1920   LEUKOCYTESUR NEGATIVE 03/10/2016 1920   Sepsis Labs: @LABRCNTIP (procalcitonin:4,lacticidven:4) ) Recent Results (from the past 240 hour(s))  Surgical pcr screen     Status: None   Collection Time: 03/11/16  7:23 AM  Result Value Ref Range Status   MRSA, PCR NEGATIVE NEGATIVE Final   Staphylococcus aureus NEGATIVE NEGATIVE Final    Comment:        The Xpert SA Assay (FDA approved for NASAL specimens in patients over 45 years of age), is one component of a comprehensive surveillance program.  Test performance has been validated by [facility] for patients greater than or equal to 22 year old. It is not intended to diagnose infection nor to guide or monitor treatment.      Scheduled Meds: . enoxaparin (LOVENOX) injection  40 mg  Subcutaneous Q24H  . feeding supplement  1 Container Oral TID BM  . furosemide  40 mg Oral BID  . nadolol  20 mg Oral Daily  . pantoprazole  40 mg Oral QHS  . sodium chloride flush  3 mL Intravenous Q12H  . spironolactone  100 mg Oral BID   Continuous Infusions:   Procedures/Studies: Ct Abdomen Pelvis Wo Contrast  03/10/2016  CLINICAL DATA:  Abdominal pain, onset today. Nausea and vomiting. Ventral hernia, question incarcerated. EXAM: CT ABDOMEN AND PELVIS WITHOUT CONTRAST TECHNIQUE: Multidetector CT imaging of the abdomen and pelvis was performed following the standard protocol without IV contrast. COMPARISON:  Most recent CT 04/13/2015 FINDINGS: Lower chest: The included lung bases are clear. No pleural effusion. Normal heart size. Liver: Nodular contours consistent with cirrhosis. No evidence of focal lesion allowing for lack contrast. Hepatobiliary: Gallbladder physiologically distended, no calcified stone. No biliary dilatation. Pancreas: No ductal dilatation.  No peripancreatic inflammation. Spleen: Upper normal in size.  Scattered granuloma. Adrenal glands: No nodule.  Kidneys: No hydronephrosis or urolithiasis. Kidneys symmetric in size. No perinephric stranding. Stomach/Bowel: Stomach distended with ingested contrast. Small amount contrast in the distal esophagus. Dilated fluid-filled small bowel loops with transition point umbilical hernia. Exiting and distal small bowel loops are decompressed. No pneumatosis. Small volume stool throughout the colon. Distal colonic diverticulosis, no evidence of diverticulitis. Appendix is normal. Vascular/Lymphatic: No retroperitoneal adenopathy. Abdominal aorta is normal in caliber. Moderate atherosclerosis without aneurysm. Reproductive: Prostate gland normal in size. Bladder: Physiologically distended. Other: Moderate volume intra-abdominal and pelvic ascites. Minimal ascites in the umbilical hernia. No free air. No definite loculated fluid collection.  Musculoskeletal: There are no acute or suspicious osseous abnormalities. IMPRESSION: 1. Small-bowel obstruction secondary to incarcerated umbilical hernia. 2. Cirrhosis with moderate volume intra-abdominal pelvic ascites. Electronically Signed   By: Jeb Levering M.D.   On: 03/10/2016 18:09    Serenna Deroy, DO  [facility] Pager [phone]  If 7PM-7AM, please contact night-coverage [email or web address] Password [facility] 03/15/2016, 4:51 PM   LOS: 5 days

## 2016-03-15 NOTE — Progress Notes (Signed)
Light green emesis at 0430 approx 200cc.  Zofran iv adm

## 2016-03-15 NOTE — Progress Notes (Signed)
Patient ID: Kevin Nicholson, male   DOB: [birth year], 55 y.o.   MRN: [id number]     [facility]      [address] [place], [place] [address]    Phone: [phone] FAX: [phone]     Subjective: Had nausea overnight, resolved, hungry.  Afebrile. Sore, pain controlled.   Objective:  Vital signs:  Filed Vitals:   03/14/16 0515 03/14/16 1342 03/14/16 2112 03/15/16 0535  BP: 109/57 124/65 137/69 113/53  Pulse: 83 76 97 100  Temp: 98.4 F (36.9 C) 99 F (37.2 C) 99.4 F (37.4 C) 98.6 F (37 C)  TempSrc: Oral Oral Oral Oral  Resp: '17 17 18 17  '$ Height:      Weight:      SpO2: 100% 100% 100% 93%    Last BM Date: 03/14/16  Intake/Output   Yesterday:  04/26 0701 - 04/27 0700 In: -  Out: 200 [Urine:200] This shift:    I/O last 3 completed shifts: In: 1200 [I.V.:1200] Out: 500 [Urine:500]     Physical Exam: General: Pt awake/alert/oriented x4 in no acute distress  Abdomen: Soft. Nondistended. Mildly tender at incisions only, incision without erythema, edges are approximated. bowel sounds are present. No evidence of peritonitis. No incarcerated hernias.   Problem List:   Principal Problem:   SBO (small bowel obstruction) (HCC) Active Problems:   Alcoholic cirrhosis of liver with ascites (HCC)   Incarcerated umbilical hernia   Malnutrition of moderate degree    Results:   Labs: Results for orders placed or performed during the hospital encounter of 03/10/16 (from the past 48 hour(s))  CBC     Status: Abnormal   Collection Time: 03/14/16  4:31 AM  Result Value Ref Range   WBC 3.7 (L) 4.0 - 10.5 K/uL   RBC 3.30 (L) 4.22 - 5.81 MIL/uL   Hemoglobin 11.4 (L) 13.0 - 17.0 g/dL   HCT 32.9 (L) 39.0 - 52.0 %   MCV 99.7 78.0 - 100.0 fL   MCH 34.5 (H) 26.0 - 34.0 pg   MCHC 34.7 30.0 - 36.0 g/dL   RDW 13.6 11.5 - 15.5 %   Platelets 171 150 - 400 K/uL  Basic metabolic panel     Status: Abnormal   Collection  Time: 03/14/16  4:31 AM  Result Value Ref Range   Sodium 140 135 - 145 mmol/L   Potassium 4.1 3.5 - 5.1 mmol/L   Chloride 104 101 - 111 mmol/L   CO2 26 22 - 32 mmol/L   Glucose, Bld 91 65 - 99 mg/dL   BUN 31 (H) 6 - 20 mg/dL   Creatinine, Ser 1.00 0.61 - 1.24 mg/dL   Calcium 8.1 (L) 8.9 - 10.3 mg/dL   GFR calc non Af Amer >60 >60 mL/min   GFR calc Af Amer >60 >60 mL/min    Comment: (NOTE) The eGFR has been calculated using the CKD EPI equation. This calculation has not been validated in all clinical situations. eGFR's persistently <60 mL/min signify possible Chronic Kidney Disease.    Anion gap 10 5 - 15  Basic metabolic panel     Status: Abnormal   Collection Time: 03/15/16  4:13 AM  Result Value Ref Range   Sodium 134 (L) 135 - 145 mmol/L   Potassium 3.7 3.5 - 5.1 mmol/L   Chloride 99 (L) 101 - 111 mmol/L   CO2 24 22 - 32 mmol/L   Glucose, Bld 126 (H) 65 - 99  mg/dL   BUN 27 (H) 6 - 20 mg/dL   Creatinine, Ser 0.94 0.61 - 1.24 mg/dL   Calcium 8.2 (L) 8.9 - 10.3 mg/dL   GFR calc non Af Amer >60 >60 mL/min   GFR calc Af Amer >60 >60 mL/min    Comment: (NOTE) The eGFR has been calculated using the CKD EPI equation. This calculation has not been validated in all clinical situations. eGFR's persistently <60 mL/min signify possible Chronic Kidney Disease.    Anion gap 11 5 - 15  Magnesium     Status: None   Collection Time: 03/15/16  4:13 AM  Result Value Ref Range   Magnesium 1.8 1.7 - 2.4 mg/dL    Imaging / Studies: No results found.  Medications / Allergies:  Scheduled Meds: . enoxaparin (LOVENOX) injection  40 mg Subcutaneous Q24H  . furosemide  40 mg Oral BID  . nadolol  20 mg Oral Daily  . pantoprazole  40 mg Oral QHS  . sodium chloride flush  3 mL Intravenous Q12H  . spironolactone  100 mg Oral BID   Continuous Infusions:  PRN Meds:.HYDROmorphone (DILAUDID) injection, morphine injection, ondansetron (ZOFRAN) IV, oxyCODONE, sodium chloride  flush  Antibiotics: Anti-infectives    Start     Dose/Rate Route Frequency Ordered Stop   03/11/16 1015  cefOXitin (MEFOXIN) 2 g in dextrose 5 % 50 mL IVPB     2 g 100 mL/hr over 30 Minutes Intravenous  Once 03/11/16 1011 03/11/16 0915       Assessment/Plan Strangulated umbilical hernia POD#4 open umbilical hernia repair, SBR---Dr. Marcello Moores -soft diet -mobilize, IS FEN-no issues VTE prophylaxis-lovenox, SCDs Cirrhosis of liver/Etoh abuse-per primary team  Dispo-later today or tomorrow depending on diet advancement   Erby Pian, ANP-BC [facility] Pager [phone](7A-4:30P)   03/15/2016 8:56 AM

## 2016-03-15 NOTE — Care Management Note (Addendum)
Case Management Note  Patient Details  Name: Kevin Nicholson MRN: [id number] Date of Birth: Dec 02, 1960  Subjective/Objective:                  SBO (small bowel obstruction) [facility]) Action/Plan: Discharge planning Expected Discharge Date:                  Expected Discharge Plan:  Home/Self Care  In-House Referral:     Discharge planning Services  CM Consult, [facility]  Post Acute Care Choice:    Choice offered to:  Patient  DME Arranged:    DME Agency:     HH Arranged:    [place] Agency:     Status of Service:  Completed, signed off  Medicare Important Message Given:    Date Medicare IM Given:    Medicare IM give by:    Date Additional Medicare IM Given:    Additional Medicare Important Message give by:     If discussed at [place] of Stay Meetings, dates discussed:    Additional Comments: CM met with pt who confirms he is active with the [facility], Dr. Adrian Blackwater.  Pt may have prescriptions filled at [place] if discharged during the week.  [facility] pharmacy fax: [phone].  Pt states he has transportation.  IF pt is discharged over the weekend, PLEASE CONTACT CM for a MATCH letter to defray cost of discharge medications (as the clinic is not open weekends). Dellie Catholic, RN 03/15/2016, 12:39 PM

## 2016-03-15 NOTE — Progress Notes (Addendum)
Nutrition Follow-up  DOCUMENTATION CODES:   Non-severe (moderate) malnutrition in context of chronic illness  INTERVENTION:  -RD continue to monitor -SOFT diet -Boost Breeze po TID, each supplement provides 250 kcal and 9 grams of protein  NUTRITION DIAGNOSIS:   Inadequate oral intake related to poor appetite, nausea as evidenced by per patient/family report.  New dx GOAL:   Patient will meet greater than or equal to 90% of their needs ongoing  MONITOR:   Diet advancement, Labs, Weight trends, I & O's  REASON FOR ASSESSMENT:   Malnutrition Screening Tool    ASSESSMENT:   55 y.o. male with medical history significant of cirrhosis of the liver with ascites and grade 1 esophageal varicies. Patient is followed by Dr. Oneida Alar with GI. Cirrhosis is believe to occur in the context of prior EtOH use in the past (non drinking now). He has monthly paracentesis for ascites, otherwise is actually healthy previously.  Kevin Nicholson diet has been advanced to SOFT but he hasn't eaten anything today r/t stomach pain/nausea. Per surgery note, pt possibly has partial SBO. Patient stated that yesterday he ate "a bunch of junk." - "Ice cream, candy, pudding." Feels like it caused the issues he is having now.  He is 4 days s/p open umbilical hernia repair  Labs: Na 134 Medications reviewed.  Diet Order:  DIET SOFT Room service appropriate?: Yes; Fluid consistency:: Thin  Skin:  Wound (see comment) (closed abdominal incision)  Last BM:  PTA  Height:   Ht Readings from Last 1 Encounters:  03/10/16 5\' 10"  (1.778 m)    Weight:   Wt Readings from Last 1 Encounters:  03/10/16 191 lb 9.3 oz (86.9 kg)    Ideal Body Weight:  75.5 kg  BMI:  Body mass index is 27.49 kg/(m^2).  Estimated Nutritional Needs:   Kcal:  2400-2600  Protein:  115-125g  Fluid:  2L/day  EDUCATION NEEDS:   No education needs identified at this time  Satira Anis. Michaelia Beilfuss, MS, RD LDN After Hours/Weekend  Pager [phone]

## 2016-03-16 ENCOUNTER — Inpatient Hospital Stay (HOSPITAL_COMMUNITY): Payer: Medicaid Other

## 2016-03-16 DIAGNOSIS — R112 Nausea with vomiting, unspecified: Secondary | ICD-10-CM

## 2016-03-16 LAB — MAGNESIUM: Magnesium: 2 mg/dL (ref 1.7–2.4)

## 2016-03-16 LAB — CBC
HCT: 34.7 % — ABNORMAL LOW (ref 39.0–52.0)
HEMOGLOBIN: 12.1 g/dL — AB (ref 13.0–17.0)
MCH: 34.2 pg — AB (ref 26.0–34.0)
MCHC: 34.9 g/dL (ref 30.0–36.0)
MCV: 98 fL (ref 78.0–100.0)
PLATELETS: 205 10*3/uL (ref 150–400)
RBC: 3.54 MIL/uL — AB (ref 4.22–5.81)
RDW: 13.2 % (ref 11.5–15.5)
WBC: 9.3 10*3/uL (ref 4.0–10.5)

## 2016-03-16 LAB — BASIC METABOLIC PANEL
ANION GAP: 13 (ref 5–15)
BUN: 34 mg/dL — ABNORMAL HIGH (ref 6–20)
CHLORIDE: 96 mmol/L — AB (ref 101–111)
CO2: 29 mmol/L (ref 22–32)
Calcium: 8.3 mg/dL — ABNORMAL LOW (ref 8.9–10.3)
Creatinine, Ser: 1.21 mg/dL (ref 0.61–1.24)
GFR calc non Af Amer: >60 mL/min (ref >60)
Glucose, Bld: 128 mg/dL — ABNORMAL HIGH (ref 65–99)
POTASSIUM: 3.6 mmol/L (ref 3.5–5.1)
Sodium: 138 mmol/L (ref 135–145)

## 2016-03-16 MED ORDER — SODIUM CHLORIDE 0.9 % IV SOLN
INTRAVENOUS | Status: DC
  Administered 2016-03-16 – 2016-03-20 (×5): via INTRAVENOUS
  Administered 2016-03-20: 75 mL/h via INTRAVENOUS

## 2016-03-16 NOTE — Progress Notes (Signed)
Pt vomited x 2 last night.  I administered zofran twice.  I obtained order to restart IVF.  Pt stated he thinks he "ate too much yesterday."   Will continue to monitor.

## 2016-03-16 NOTE — Progress Notes (Signed)
PROGRESS NOTE  Kevin Nicholson [id number] DOB: 07/08/1961 DOA: 03/10/2016 PCP: Minerva Ends, MD Outpatient Specialists: Dr. Earma Reading  Brief History:  55 y.o. male with medical history significant of cirrhosis of the liver with ascites and grade 1 esophageal varicies. Patient is followed by Dr. Oneida Alar with GI. Cirrhosis felt to be EtOH etiology (not drinking now). He has monthly paracentesis for ascites. Patient presented to the ED with c/o N/V, abdominal pain, loose stool that started [id number] pm.  Umbilical hernia is found to be painful, hard, non-reducible. CT scan confirmed obstruction due to incarcerated umbilical hernia XX123456: NGT placed, CCS consulted 4/23: diagnostic laparoscopy, open HERNIA REPAIR UMBILICAL, small bowel resection. Postoperatively, the patient had a postoperative ileus which required NG tube decompression. The patient accidentally sneezed out the NG tube on 03/13/2016. Fortunately, it appears that his ileus has resolved as the patient has had numerous bowel movements.  03/15/2016--had 2 episodes of emesis in the evening; AXR c/w SBO vs ileus   Assessment/Plan: Incarcerated umbilical hernia causing SBO  -didn't improve with NPO, NG decompression -s/p open hernia repair and SB resection 03/11/16  -Patient developed postoperative ileus requiring NG tube decompression--> improved -03/15/16--abd more distended-->AXR consistent with SBO vs ileus -continue sips with meds unless he vomits again at which point, his furosemide would need to be changed to IV -restart IVF -Ambulate, Incentive spirometry  Alcoholic cirrhosis of liver  -MELD score of 14 -Child class B due to moderate ascites and grade 1 varices -required monthly paracentesis, until diuretic dose increased, last 2-64months ago -03/14/16--Restarted furosemide and spironolactone -03/15/16-restart nadolol -Continue furosemide, spironolactone, nadolol as long as he is allowed  sips with meds  Tobacco use -suspect undiagnosed COPD -counseled, nebs PRN  Moderate malnutrition  DVT prophylaxis: lovenox Code Status: Full Family Communication: No family in room  Consultants: [facility] Dr.Thomas  Procedures:PROCEDURE: diagnostic laparoscopy, open HERNIA REPAIR UMBILICAL ADULT, small bowel resection    Disposition Plan: Home when cleared by [facility]  Subjective: Patient into episodes of emesis in the evening of 03/15/2016. He is passing flatus and had a small bowel movement in the afternoon of 03/08/2016. He feels that his abdomen is a little less distended than yesterday. Denies any chest pain, shortness breath, headache, neck pain, fevers, chills, dysuria, hematuria. No rashes.  Objective: Filed Vitals:   03/16/16 0556 03/16/16 0917 03/16/16 0918 03/16/16 1414  BP: 105/60   99/62  Pulse: 72   71  Temp: 97.5 F (36.4 C)   98.8 F (37.1 C)  TempSrc: Oral   Oral  Resp: 18   16  Height:      Weight:      SpO2: 95% 99% 98% 94%    Intake/Output Summary (Last 24 hours) at 03/16/16 1516 Last data filed at 03/16/16 1415  Gross per 24 hour  Intake    915 ml  Output    575 ml  Net    340 ml   Weight change:  Exam:   General:  Pt is alert, follows commands appropriately, not in acute distress  HEENT: No icterus, No thrush, No neck mass, [place]/AT  Cardiovascular: RRR, S1/S2, no rubs, no gallops  Respiratory: Diminished breath sounds at the bases without any wheezing. Good air movement.  Abdomen: Soft/+BS, non tender,mildly distended, no guarding  Extremities: No edema, No lymphangitis, No petechiae, No rashes, no synovitis   Data Reviewed: I have personally reviewed following labs and imaging studies Basic Metabolic Panel:  Recent Labs Lab 03/11/16 0444 03/12/16 0416 03/14/16 0431 03/15/16 0413 03/16/16 0352  NA 137 138 140 134* 138  K 4.8 5.1 4.1 3.7 3.6  CL 98* 105 104 99* 96*  CO2 29 25 26 24 29   GLUCOSE 141* 122* 91 126* 128*  BUN 34*  35* 31* 27* 34*  CREATININE 1.51* 1.37* 1.00 0.94 1.21  CALCIUM 9.6 8.5* 8.1* 8.2* 8.3*  MG  --   --   --  1.8 2.0   Liver Function Tests:  Recent Labs Lab 03/10/16 1553 03/11/16 0444 03/12/16 0416  AST 29 29 18   ALT 30 27 16*  ALKPHOS 144* 144* 82  BILITOT 1.4* 1.8* 2.6*  PROT 8.3* 8.4* 6.2*  ALBUMIN 4.1 4.4 3.0*    Recent Labs Lab 03/10/16 1553  LIPASE 38   No results for input(s): AMMONIA in the last 168 hours. Coagulation Profile:  Recent Labs Lab 03/11/16 0444  INR 1.16   CBC:  Recent Labs Lab 03/10/16 1553 03/11/16 0444 03/12/16 0416 03/14/16 0431 03/16/16 0352  WBC 9.0 13.9* 7.6 3.7* 9.3  NEUTROABS 7.3  --   --   --   --   HGB 16.1 16.5 14.1 11.4* 12.1*  HCT 44.7 46.6 40.6 32.9* 34.7*  MCV 96.5 98.3 98.5 99.7 98.0  PLT 265 297 217 171 205   Cardiac Enzymes: No results for input(s): CKTOTAL, CKMB, CKMBINDEX, TROPONINI in the last 168 hours. BNP: Invalid input(s): POCBNP CBG: No results for input(s): GLUCAP in the last 168 hours. HbA1C: No results for input(s): HGBA1C in the last 72 hours. Urine analysis:    Component Value Date/Time   COLORURINE AMBER* 03/10/2016 1920   APPEARANCEUR CLEAR 03/10/2016 1920   LABSPEC 1.025 03/10/2016 1920   PHURINE 5.5 03/10/2016 1920   GLUCOSEU NEGATIVE 03/10/2016 1920   HGBUR NEGATIVE 03/10/2016 1920   BILIRUBINUR NEGATIVE 03/10/2016 1920   KETONESUR NEGATIVE 03/10/2016 1920   PROTEINUR NEGATIVE 03/10/2016 1920   NITRITE NEGATIVE 03/10/2016 1920   LEUKOCYTESUR NEGATIVE 03/10/2016 1920   Sepsis Labs: @LABRCNTIP (procalcitonin:4,lacticidven:4) ) Recent Results (from the past 240 hour(s))  Surgical pcr screen     Status: None   Collection Time: 03/11/16  7:23 AM  Result Value Ref Range Status   MRSA, PCR NEGATIVE NEGATIVE Final   Staphylococcus aureus NEGATIVE NEGATIVE Final    Comment:        The Xpert SA Assay (FDA approved for NASAL specimens in patients over 15 years of age), is one component  of a comprehensive surveillance program.  Test performance has been validated by [facility] for patients greater than or equal to 70 year old. It is not intended to diagnose infection nor to guide or monitor treatment.      Scheduled Meds: . enoxaparin (LOVENOX) injection  40 mg Subcutaneous Q24H  . feeding supplement  1 Container Oral TID BM  . furosemide  40 mg Oral BID  . nadolol  20 mg Oral Daily  . pantoprazole  40 mg Oral QHS  . sodium chloride flush  3 mL Intravenous Q12H  . spironolactone  100 mg Oral BID   Continuous Infusions: . sodium chloride 75 mL/hr at 03/16/16 0636    Procedures/Studies: Ct Abdomen Pelvis Wo Contrast  03/10/2016  CLINICAL DATA:  Abdominal pain, onset today. Nausea and vomiting. Ventral hernia, question incarcerated. EXAM: CT ABDOMEN AND PELVIS WITHOUT CONTRAST TECHNIQUE: Multidetector CT imaging of the abdomen and pelvis was performed following the standard protocol without IV contrast. COMPARISON:  Most recent  CT 04/13/2015 FINDINGS: Lower chest: The included lung bases are clear. No pleural effusion. Normal heart size. Liver: Nodular contours consistent with cirrhosis. No evidence of focal lesion allowing for lack contrast. Hepatobiliary: Gallbladder physiologically distended, no calcified stone. No biliary dilatation. Pancreas: No ductal dilatation.  No peripancreatic inflammation. Spleen: Upper normal in size.  Scattered granuloma. Adrenal glands: No nodule. Kidneys: No hydronephrosis or urolithiasis. Kidneys symmetric in size. No perinephric stranding. Stomach/Bowel: Stomach distended with ingested contrast. Small amount contrast in the distal esophagus. Dilated fluid-filled small bowel loops with transition point umbilical hernia. Exiting and distal small bowel loops are decompressed. No pneumatosis. Small volume stool throughout the colon. Distal colonic diverticulosis, no evidence of diverticulitis. Appendix is normal. Vascular/Lymphatic: No  retroperitoneal adenopathy. Abdominal aorta is normal in caliber. Moderate atherosclerosis without aneurysm. Reproductive: Prostate gland normal in size. Bladder: Physiologically distended. Other: Moderate volume intra-abdominal and pelvic ascites. Minimal ascites in the umbilical hernia. No free air. No definite loculated fluid collection. Musculoskeletal: There are no acute or suspicious osseous abnormalities. IMPRESSION: 1. Small-bowel obstruction secondary to incarcerated umbilical hernia. 2. Cirrhosis with moderate volume intra-abdominal pelvic ascites. Electronically Signed   By: Jeb Levering M.D.   On: 03/10/2016 18:09   Dg Abd 2 Views  03/16/2016  CLINICAL DATA:  Nausea, vomiting, abdominal pain and loose stools. EXAM: ABDOMEN - 2 VIEW COMPARISON:  CT abdomen pelvis 03/10/2016. FINDINGS: There is gaseous distension of small bowel with associated air-fluid levels. Minimal stool in the colon with small amount of gas and stool in the rectum. Lung bases are clear. IMPRESSION: Bowel gas pattern is indicative of a small bowel obstruction. Electronically Signed   By: Lorin Picket M.D.   On: 03/16/2016 10:06    Keiosha Cancro, DO  [facility] Pager [phone]  If 7PM-7AM, please contact night-coverage [email or web address] Password [email or web address] 03/16/2016, 3:16 PM   LOS: 6 days

## 2016-03-16 NOTE — Progress Notes (Signed)
Patient ID: Kevin Nicholson, male   DOB: 1961/01/04, 55 y.o.   MRN: [id number]     [facility]      [address] [place], [place] [address]    Phone: [phone] FAX: [phone]     Subjective: N/v overnight.  Poor po intake. Voiding. Ambulating. Sore. Afebrile.  Vss.  Objective:  Vital signs:  Filed Vitals:   03/15/16 0535 03/15/16 1409 03/15/16 2132 03/16/16 0556  BP: 113/53 112/70 108/55 105/60  Pulse: 100 87 76 72  Temp: 98.6 F (37 C) 98.4 F (36.9 C) 98.2 F (36.8 C) 97.5 F (36.4 C)  TempSrc: Oral Oral Oral Oral  Resp: '17 18 18 18  '$ Height:      Weight:      SpO2: 93% 99% 97% 95%    Last BM Date: 03/14/16  Intake/Output   Yesterday:  04/27 0701 - 04/28 0700 In: -  Out: 546 [Urine:675] This shift:    I/O last 3 completed shifts: In: -  Out: 675 [Urine:675]    Physical Exam: General: Pt awake/alert/oriented x4 in no acute distress  Abdomen: Soft. distended. Mildly tender at incisions only, incision without erythema, edges are approximated. bowel sounds are present. No evidence of peritonitis. No incarcerated hernias.     Problem List:   Principal Problem:   SBO (small bowel obstruction) (HCC) Active Problems:   Alcoholic cirrhosis of liver with ascites (HCC)   Incarcerated umbilical hernia   Malnutrition of moderate degree    Results:   Labs: Results for orders placed or performed during the hospital encounter of 03/10/16 (from the past 48 hour(s))  Basic metabolic panel     Status: Abnormal   Collection Time: 03/15/16  4:13 AM  Result Value Ref Range   Sodium 134 (L) 135 - 145 mmol/L   Potassium 3.7 3.5 - 5.1 mmol/L   Chloride 99 (L) 101 - 111 mmol/L   CO2 24 22 - 32 mmol/L   Glucose, Bld 126 (H) 65 - 99 mg/dL   BUN 27 (H) 6 - 20 mg/dL   Creatinine, Ser 0.94 0.61 - 1.24 mg/dL   Calcium 8.2 (L) 8.9 - 10.3 mg/dL   GFR calc non Af Amer >60 >60 mL/min   GFR calc Af Amer >60 >60  mL/min    Comment: (NOTE) The eGFR has been calculated using the CKD EPI equation. This calculation has not been validated in all clinical situations. eGFR's persistently <60 mL/min signify possible Chronic Kidney Disease.    Anion gap 11 5 - 15  Magnesium     Status: None   Collection Time: 03/15/16  4:13 AM  Result Value Ref Range   Magnesium 1.8 1.7 - 2.4 mg/dL  Basic metabolic panel     Status: Abnormal   Collection Time: 03/16/16  3:52 AM  Result Value Ref Range   Sodium 138 135 - 145 mmol/L   Potassium 3.6 3.5 - 5.1 mmol/L   Chloride 96 (L) 101 - 111 mmol/L   CO2 29 22 - 32 mmol/L   Glucose, Bld 128 (H) 65 - 99 mg/dL   BUN 34 (H) 6 - 20 mg/dL   Creatinine, Ser 1.21 0.61 - 1.24 mg/dL   Calcium 8.3 (L) 8.9 - 10.3 mg/dL   GFR calc non Af Amer >60 >60 mL/min   GFR calc Af Amer >60 >60 mL/min    Comment: (NOTE) The eGFR has been calculated using the CKD EPI equation. This calculation has  not been validated in all clinical situations. eGFR's persistently <60 mL/min signify possible Chronic Kidney Disease.    Anion gap 13 5 - 15  CBC     Status: Abnormal   Collection Time: 03/16/16  3:52 AM  Result Value Ref Range   WBC 9.3 4.0 - 10.5 K/uL   RBC 3.54 (L) 4.22 - 5.81 MIL/uL   Hemoglobin 12.1 (L) 13.0 - 17.0 g/dL   HCT 34.7 (L) 39.0 - 52.0 %   MCV 98.0 78.0 - 100.0 fL   MCH 34.2 (H) 26.0 - 34.0 pg   MCHC 34.9 30.0 - 36.0 g/dL   RDW 13.2 11.5 - 15.5 %   Platelets 205 150 - 400 K/uL  Magnesium     Status: None   Collection Time: 03/16/16  3:52 AM  Result Value Ref Range   Magnesium 2.0 1.7 - 2.4 mg/dL    Imaging / Studies: No results found.  Medications / Allergies:  Scheduled Meds: . enoxaparin (LOVENOX) injection  40 mg Subcutaneous Q24H  . feeding supplement  1 Container Oral TID BM  . furosemide  40 mg Oral BID  . nadolol  20 mg Oral Daily  . pantoprazole  40 mg Oral QHS  . sodium chloride flush  3 mL Intravenous Q12H  . spironolactone  100 mg Oral BID    Continuous Infusions: . sodium chloride 75 mL/hr at 03/16/16 0636   PRN Meds:.HYDROmorphone (DILAUDID) injection, morphine injection, ondansetron (ZOFRAN) IV, oxyCODONE, sodium chloride flush  Antibiotics: Anti-infectives    Start     Dose/Rate Route Frequency Ordered Stop   03/11/16 1015  cefOXitin (MEFOXIN) 2 g in dextrose 5 % 50 mL IVPB     2 g 100 mL/hr over 30 Minutes Intravenous  Once 03/11/16 1011 03/11/16 0915       Assessment/Plan Strangulated umbilical hernia POD#5 open umbilical hernia repair, SBR---Dr. Earnestine Mealing op ileus -clears for now, check AXR -mobilize, IS FEN-ivf VTE prophylaxis-lovenox, SCDs Cirrhosis of liver/Etoh abuse-per primary team  Dispo-ileus   Erby Pian, ANP-BC [facility] Pager [phone](7A-[id number]:30P)   03/16/2016 8:41 AM

## 2016-03-17 ENCOUNTER — Inpatient Hospital Stay (HOSPITAL_COMMUNITY): Payer: Medicaid Other

## 2016-03-17 LAB — BASIC METABOLIC PANEL
ANION GAP: 9 (ref 5–15)
BUN: 38 mg/dL — ABNORMAL HIGH (ref 6–20)
CO2: 29 mmol/L (ref 22–32)
Calcium: 7.9 mg/dL — ABNORMAL LOW (ref 8.9–10.3)
Chloride: 98 mmol/L — ABNORMAL LOW (ref 101–111)
Creatinine, Ser: 1.21 mg/dL (ref 0.61–1.24)
Glucose, Bld: 111 mg/dL — ABNORMAL HIGH (ref 65–99)
POTASSIUM: 4.3 mmol/L (ref 3.5–5.1)
SODIUM: 136 mmol/L (ref 135–145)

## 2016-03-17 LAB — MAGNESIUM: MAGNESIUM: 2 mg/dL (ref 1.7–2.4)

## 2016-03-17 LAB — CBC
HEMATOCRIT: 35.3 % — AB (ref 39.0–52.0)
HEMOGLOBIN: 12.2 g/dL — AB (ref 13.0–17.0)
MCH: 33.9 pg (ref 26.0–34.0)
MCHC: 34.6 g/dL (ref 30.0–36.0)
MCV: 98.1 fL (ref 78.0–100.0)
PLATELETS: 206 10*3/uL (ref 150–400)
RBC: 3.6 MIL/uL — ABNORMAL LOW (ref 4.22–5.81)
RDW: 13.3 % (ref 11.5–15.5)
WBC: 8.6 10*3/uL (ref 4.0–10.5)

## 2016-03-17 NOTE — Progress Notes (Signed)
PROGRESS NOTE  BRISEN GAFFEY [id number] DOB: 1961/03/28 DOA: 03/10/2016 PCP: Minerva Ends, MD Outpatient Specialists: Dr. Earma Reading  Brief History:  55 y.o. male with medical history significant of cirrhosis of the liver with ascites and grade 1 esophageal varicies. Patient is followed by Dr. Oneida Alar with GI. Cirrhosis felt to be EtOH etiology (not drinking now). He has monthly paracentesis for ascites. Patient presented to the ED with c/o N/V, abdominal pain, loose stool that started [id number] pm.  Umbilical hernia is found to be painful, hard, non-reducible. CT scan confirmed obstruction due to incarcerated umbilical hernia XX123456: NGT placed, [facility] consulted 4/23: diagnostic laparoscopy, open HERNIA REPAIR UMBILICAL, small bowel resection. Postoperatively, the patient had a postoperative ileus which required NG tube decompression. The patient accidentally sneezed out the NG tube on 03/13/2016. Fortunately, it appears that his ileus has resolved as the patient has had numerous bowel movements. Unfortunately, the patient's abdomen became more distended again after his diet was advanced.   Assessment/Plan: Incarcerated umbilical hernia causing SBO-->post op ileus -didn't improve with NPO, NG decompression -s/p open hernia repair and SB resection 03/11/16  -Patient developed postoperative ileus requiring NG tube decompression--> improved initially -03/15/16--abd more distended with N/V-->back to ice chips -03/17/2016--AXR-unchanged gaseous distention -Ambulate, Incentive spirometry  Alcoholic cirrhosis of liver  -MELD score of 14 -Child class B due to moderate ascites and grade 1 varices -required monthly paracentesis, until diuretic dose increased, last 2-18months ago -03/17/2016--hold additional doses of furosemide, spironolactone and nadolol secondary to soft blood pressures  Tobacco use -suspect undiagnosed COPD -counseled, nebs PRN  Moderate  malnutrition  DVT prophylaxis: lovenox Code Status: Full Family Communication: No family in room  Consultants: [facility] Dr.Thomas  Procedures:PROCEDURE: diagnostic laparoscopy, open HERNIA REPAIR UMBILICAL ADULT, small bowel resection    Disposition Plan: Home when cleared by [facility]  Subjective: Patient states his abdomen is less distended today.  Had small bowel movement. Denies any fevers, chills, chest pain, short of breath, vomiting, dysuria, hematuria.  Objective: Filed Vitals:   03/16/16 0918 03/16/16 1414 03/16/16 2233 03/17/16 0545  BP:  99/62 92/51 94/46   Pulse:  71 73 70  Temp:  98.8 F (37.1 C) 98 F (36.7 C) 98.9 F (37.2 C)  TempSrc:  Oral Oral Oral  Resp:  16 16 18   Height:      Weight:      SpO2: 98% 94% 97% 97%    Intake/Output Summary (Last 24 hours) at 03/17/16 1510 Last data filed at 03/17/16 1300  Gross per 24 hour  Intake   1743 ml  Output   1000 ml  Net    743 ml   Weight change:  Exam:   General:  Pt is alert, follows commands appropriately, not in acute distress  HEENT: No icterus, No thrush, No neck mass, [place]/AT  Cardiovascular: RRR, S1/S2, no rubs, no gallops  Respiratory: Diminished breath sounds at the bases but clear to auscultation. No wheezing  Abdomen: Soft/+BS, non tender, non distended, no guarding  Extremities: trace LE edema, No lymphangitis, No petechiae, No rashes, no synovitis   Data Reviewed: I have personally reviewed following labs and imaging studies Basic Metabolic Panel:  Recent Labs Lab 03/12/16 0416 03/14/16 0431 03/15/16 0413 03/16/16 0352 03/17/16 0522  NA 138 140 134* 138 136  K 5.1 4.1 3.7 3.6 4.3  CL 105 104 99* 96* 98*  CO2 25 26 24 29 29   GLUCOSE 122* 91 126* 128* 111*  BUN 35* 31* 27* 34* 38*  CREATININE 1.37* 1.00 0.94 1.21 1.21  CALCIUM 8.5* 8.1* 8.2* 8.3* 7.9*  MG  --   --  1.8 2.0 2.0   Liver Function Tests:  Recent Labs Lab 03/10/16 1553 03/11/16 0444 03/12/16 0416  AST 29 29 18     ALT 30 27 16*  ALKPHOS 144* 144* 82  BILITOT 1.4* 1.8* 2.6*  PROT 8.3* 8.4* 6.2*  ALBUMIN 4.1 4.4 3.0*    Recent Labs Lab 03/10/16 1553  LIPASE 38   No results for input(s): AMMONIA in the last 168 hours. Coagulation Profile:  Recent Labs Lab 03/11/16 0444  INR 1.16   CBC:  Recent Labs Lab 03/10/16 1553 03/11/16 0444 03/12/16 0416 03/14/16 0431 03/16/16 0352 03/17/16 0522  WBC 9.0 13.9* 7.6 3.7* 9.3 8.6  NEUTROABS 7.3  --   --   --   --   --   HGB 16.1 16.5 14.1 11.4* 12.1* 12.2*  HCT 44.7 46.6 40.6 32.9* 34.7* 35.3*  MCV 96.5 98.3 98.5 99.7 98.0 98.1  PLT 265 297 217 171 205 206   Cardiac Enzymes: No results for input(s): CKTOTAL, CKMB, CKMBINDEX, TROPONINI in the last 168 hours. BNP: Invalid input(s): POCBNP CBG: No results for input(s): GLUCAP in the last 168 hours. HbA1C: No results for input(s): HGBA1C in the last 72 hours. Urine analysis:    Component Value Date/Time   COLORURINE AMBER* 03/10/2016 1920   APPEARANCEUR CLEAR 03/10/2016 1920   LABSPEC 1.025 03/10/2016 1920   PHURINE 5.5 03/10/2016 1920   GLUCOSEU NEGATIVE 03/10/2016 1920   HGBUR NEGATIVE 03/10/2016 1920   BILIRUBINUR NEGATIVE 03/10/2016 1920   KETONESUR NEGATIVE 03/10/2016 1920   PROTEINUR NEGATIVE 03/10/2016 1920   NITRITE NEGATIVE 03/10/2016 1920   LEUKOCYTESUR NEGATIVE 03/10/2016 1920   Sepsis Labs: @LABRCNTIP (procalcitonin:4,lacticidven:4) ) Recent Results (from the past 240 hour(s))  Surgical pcr screen     Status: None   Collection Time: 03/11/16  7:23 AM  Result Value Ref Range Status   MRSA, PCR NEGATIVE NEGATIVE Final   Staphylococcus aureus NEGATIVE NEGATIVE Final    Comment:        The Xpert SA Assay (FDA approved for NASAL specimens in patients over 66 years of age), is one component of a comprehensive surveillance program.  Test performance has been validated by [facility] for patients greater than or equal to 60 year old. It is not intended to diagnose  infection nor to guide or monitor treatment.      Scheduled Meds: . enoxaparin (LOVENOX) injection  40 mg Subcutaneous Q24H  . feeding supplement  1 Container Oral TID BM  . furosemide  40 mg Oral BID  . nadolol  20 mg Oral Daily  . pantoprazole  40 mg Oral QHS  . sodium chloride flush  3 mL Intravenous Q12H  . spironolactone  100 mg Oral BID   Continuous Infusions: . sodium chloride 75 mL/hr at 03/16/16 0636    Procedures/Studies: Ct Abdomen Pelvis Wo Contrast  03/10/2016  CLINICAL DATA:  Abdominal pain, onset today. Nausea and vomiting. Ventral hernia, question incarcerated. EXAM: CT ABDOMEN AND PELVIS WITHOUT CONTRAST TECHNIQUE: Multidetector CT imaging of the abdomen and pelvis was performed following the standard protocol without IV contrast. COMPARISON:  Most recent CT 04/13/2015 FINDINGS: Lower chest: The included lung bases are clear. No pleural effusion. Normal heart size. Liver: Nodular contours consistent with cirrhosis. No evidence of focal lesion allowing for lack contrast. Hepatobiliary: Gallbladder physiologically distended, no calcified stone. No  biliary dilatation. Pancreas: No ductal dilatation.  No peripancreatic inflammation. Spleen: Upper normal in size.  Scattered granuloma. Adrenal glands: No nodule. Kidneys: No hydronephrosis or urolithiasis. Kidneys symmetric in size. No perinephric stranding. Stomach/Bowel: Stomach distended with ingested contrast. Small amount contrast in the distal esophagus. Dilated fluid-filled small bowel loops with transition point umbilical hernia. Exiting and distal small bowel loops are decompressed. No pneumatosis. Small volume stool throughout the colon. Distal colonic diverticulosis, no evidence of diverticulitis. Appendix is normal. Vascular/Lymphatic: No retroperitoneal adenopathy. Abdominal aorta is normal in caliber. Moderate atherosclerosis without aneurysm. Reproductive: Prostate gland normal in size. Bladder: Physiologically distended.  Other: Moderate volume intra-abdominal and pelvic ascites. Minimal ascites in the umbilical hernia. No free air. No definite loculated fluid collection. Musculoskeletal: There are no acute or suspicious osseous abnormalities. IMPRESSION: 1. Small-bowel obstruction secondary to incarcerated umbilical hernia. 2. Cirrhosis with moderate volume intra-abdominal pelvic ascites. Electronically Signed   By: Jeb Levering M.D.   On: 03/10/2016 18:09   Dg Abd 2 Views  03/17/2016  CLINICAL DATA:  Patient with abdominal distension. Follow-up small bowel obstruction. EXAM: ABDOMEN - 2 VIEW COMPARISON:  03/16/2016. FINDINGS: Re- demonstrated multiple gaseous distended loops of small bowel within the central abdomen with decompressed small bowel within the right lower quadrant. Decubitus views demonstrate no evidence for free intraperitoneal air. Overall the small bowel loops are stable to slightly more gaseous distended than on prior exam. IMPRESSION: Stable to slight interval increase in gaseous distention of multiple small bowel loops within the central abdomen. No evidence for free intraperitoneal air. Electronically Signed   By: Lovey Newcomer M.D.   On: 03/17/2016 09:00   Dg Abd 2 Views  03/16/2016  CLINICAL DATA:  Nausea, vomiting, abdominal pain and loose stools. EXAM: ABDOMEN - 2 VIEW COMPARISON:  CT abdomen pelvis 03/10/2016. FINDINGS: There is gaseous distension of small bowel with associated air-fluid levels. Minimal stool in the colon with small amount of gas and stool in the rectum. Lung bases are clear. IMPRESSION: Bowel gas pattern is indicative of a small bowel obstruction. Electronically Signed   By: Lorin Picket M.D.   On: 03/16/2016 10:06    Julicia Krieger, DO  [facility] Pager [phone]  If 7PM-7AM, please contact night-coverage [email or web address] Password [email or web address] 03/17/2016, 3:10 PM   LOS: 7 days

## 2016-03-18 LAB — BASIC METABOLIC PANEL
ANION GAP: 10 (ref 5–15)
BUN: 42 mg/dL — ABNORMAL HIGH (ref 6–20)
CHLORIDE: 101 mmol/L (ref 101–111)
CO2: 27 mmol/L (ref 22–32)
Calcium: 7.8 mg/dL — ABNORMAL LOW (ref 8.9–10.3)
Creatinine, Ser: 1.25 mg/dL — ABNORMAL HIGH (ref 0.61–1.24)
GFR calc Af Amer: >60 mL/min (ref >60)
Glucose, Bld: 95 mg/dL (ref 65–99)
POTASSIUM: 4.1 mmol/L (ref 3.5–5.1)
Sodium: 138 mmol/L (ref 135–145)

## 2016-03-18 NOTE — Progress Notes (Signed)
Patient ID: Kevin Nicholson, male   DOB: [birth year], 55 y.o.   MRN: [id number] 7 Days Post-Op  Subjective: Feels about the same. No vomiting.. Occasional mild nausea. Taking only ice chips. Passed some flatus and small bowel movements. He feels like he would be sick if he tried to eat or drink.  Objective: Vital signs in last 24 hours: Temp:  [97.4 F (36.3 C)-98.3 F (36.8 C)] 98.2 F (36.8 C) (04/30 0611) Pulse Rate:  [64-70] 66 (04/30 0611) Resp:  [16] 16 (04/30 0611) BP: (89-95)/(51-55) 95/53 mmHg (04/30 0611) SpO2:  [97 %-98 %] 97 % (04/30 0611) Last BM Date: 03/14/16  Intake/Output from previous day: 04/09/2023 0701 - 04/30 0700 In: 375 [I.V.:375] Out: -  Intake/Output this shift:    General appearance: alert, cooperative and no distress GI: remains moderately distended but possibly a little softer. Tympanitic. Nontender. Incision/Wound: clean and dry without apparent hernia  Lab Results:   Recent Labs  03/16/16 0352 04/08/2016 0522  WBC 9.3 8.6  HGB 12.1* 12.2*  HCT 34.7* 35.3*  PLT 205 206   BMET  Recent Labs  04/08/2016 0522 03/18/16 0414  NA 136 138  K 4.3 4.1  CL 98* 101  CO2 29 27  GLUCOSE 111* 95  BUN 38* 42*  CREATININE 1.21 1.25*  CALCIUM 7.9* 7.8*     Studies/Results: Dg Abd 2 Views  Apr 08, 2016  CLINICAL DATA:  Patient with abdominal distension. Follow-up small bowel obstruction. EXAM: ABDOMEN - 2 VIEW COMPARISON:  03/16/2016. FINDINGS: Re- demonstrated multiple gaseous distended loops of small bowel within the central abdomen with decompressed small bowel within the right lower quadrant. Decubitus views demonstrate no evidence for free intraperitoneal air. Overall the small bowel loops are stable to slightly more gaseous distended than on prior exam. IMPRESSION: Stable to slight interval increase in gaseous distention of multiple small bowel loops within the central abdomen. No evidence for free intraperitoneal air. Electronically Signed   By: Lovey Newcomer M.D.   On: 04/08/2016 09:00   Dg Abd 2 Views  03/16/2016  CLINICAL DATA:  Nausea, vomiting, abdominal pain and loose stools. EXAM: ABDOMEN - 2 VIEW COMPARISON:  CT abdomen pelvis 03/10/2016. FINDINGS: There is gaseous distension of small bowel with associated air-fluid levels. Minimal stool in the colon with small amount of gas and stool in the rectum. Lung bases are clear. IMPRESSION: Bowel gas pattern is indicative of a small bowel obstruction. Electronically Signed   By: Lorin Picket M.D.   On: 03/16/2016 10:06    Anti-infectives: Anti-infectives    Start     Dose/Rate Route Frequency Ordered Stop   03/11/16 1015  cefOXitin (MEFOXIN) 2 g in dextrose 5 % 50 mL IVPB     2 g 100 mL/hr over 30 Minutes Intravenous  Once 03/11/16 1011 03/11/16 0915      Assessment/Plan: s/p Procedure(s): laparoscopy, open HERNIA REPAIR UMBILICAL ADULT, small bowel resection  Postoperative partial small bowel obstruction. Continue nothing by mouth today. NG tube if he has any vomiting. Repeat abdominal x-rays in a.m.    LOS: 8 days    [name] T 03/18/2016

## 2016-03-18 NOTE — Progress Notes (Signed)
PROGRESS NOTE  EZE TONTHAT [id number] DOB: 23-Jan-1961 DOA: 03/10/2016 PCP: Minerva Ends, MD Outpatient Specialists: Dr. Earma Reading  Brief History:  55 y.o. male with medical history significant of cirrhosis of the liver with ascites and grade 1 esophageal varicies.Cirrhosis felt to be EtOH etiology (not drinking now). He has monthly paracentesis for ascites. Patient presented to the ED with c/o N/V, abdominal pain, loose stool that started [id number] pm.  Umbilical hernia was found to be painful, hard, non-reducible. CT scan confirmed obstruction due to incarcerated umbilical hernia [id number]: NGT placed, [facility] consulted 4/23: diagnostic laparoscopy, open HERNIA REPAIR UMBILICAL, small bowel resection. Postoperatively, the patient had a postoperative ileus which required NG tube decompression. The patient accidentally sneezed out the NG tube on 03/13/2016. Fortunately, it appears that his ileus has resolved as the patient has had numerous bowel movements. Unfortunately, the patient's abdomen became more distended again after his diet was advanced.   Assessment/Plan: Incarcerated umbilical hernia causing SBO-->post op ileus -didn't improve with NPO, NG decompression -s/p open hernia repair and SB resection 03/11/16  -Patient developed postoperative ileus requiring NG tube decompression--> improved initially -03/15/16--abd more distended with N/V-->back to ice chips -03/17/2016--AXR-unchanged gaseous distention -Ambulate, Incentive spirometry  Alcoholic cirrhosis of liver  -MELD score of 14 -Child class B due to moderate ascites and grade 1 varices -required monthly paracentesis, until diuretic dose increased, last 2-66months  -03/17/2016--hold additional doses of furosemide, spironolactone and nadolol secondary to soft blood pressures  Tobacco use -suspect undiagnosed COPD -counseled, nebs PRN  Moderate malnutrition  DVT prophylaxis: lovenox Code  Status: Full Family Communication: No family in room  Consultants: [facility] Dr.Thomas  Procedures:PROCEDURE: diagnostic laparoscopy, open HERNIA REPAIR UMBILICAL ADULT, small bowel resection    Disposition Plan: Home when cleared by [facility]    Subjective: Patient feels quite nauseous and states that he would vomit if he were to drink anything. This abdominal pain is not much worse than yesterday. Denies any fevers, chills, chest pain, shortness breath, coughing, hemoptysis. He has had 2 small bowel movements in the past 2 days.  Objective: Filed Vitals:   03/17/16 1545 03/17/16 2209 03/18/16 0611 03/18/16 1308  BP: 92/51 94/55 95/53  108/65  Pulse: 64 70 66 68  Temp:  97.4 F (36.3 C) 98.2 F (36.8 C) 97.7 F (36.5 C)  TempSrc:  Oral Oral Oral  Resp:  16 16 16   Height:      Weight:      SpO2:  97% 97% 100%    Intake/Output Summary (Last 24 hours) at 03/18/16 1631 Last data filed at 03/18/16 1309  Gross per 24 hour  Intake   1875 ml  Output      0 ml  Net   1875 ml   Weight change:  Exam:   General:  Pt is alert, follows commands appropriately, not in acute distress  HEENT: No icterus, No thrush, No neck mass, Joffre/AT  Cardiovascular: RRR, S1/S2, no rubs, no gallops  Respiratory: Diminished breath sounds but clear to auscultation. No wheezing.  Abdomen: Soft/+BS, Diffusely tender with generalized distention no guarding  Extremities: trace LE edema, No lymphangitis, No petechiae, No rashes, no synovitis   Data Reviewed: I have personally reviewed following labs and imaging studies Basic Metabolic Panel:  Recent Labs Lab 03/14/16 0431 03/15/16 0413 03/16/16 0352 03/17/16 0522 03/18/16 0414  NA 140 134* 138 136 138  K 4.1 3.7 3.6 4.3 4.1  CL 104 99* 96* 98*  101  CO2 26 24 29 29 27   GLUCOSE 91 126* 128* 111* 95  BUN 31* 27* 34* 38* 42*  CREATININE 1.00 0.94 1.21 1.21 1.25*  CALCIUM 8.1* 8.2* 8.3* 7.9* 7.8*  MG  --  1.8 2.0 2.0  --    Liver Function  Tests:  Recent Labs Lab 03/12/16 0416  AST 18  ALT 16*  ALKPHOS 82  BILITOT 2.6*  PROT 6.2*  ALBUMIN 3.0*   No results for input(s): LIPASE, AMYLASE in the last 168 hours. No results for input(s): AMMONIA in the last 168 hours. Coagulation Profile: No results for input(s): INR, PROTIME in the last 168 hours. CBC:  Recent Labs Lab 03/12/16 0416 03/14/16 0431 03/16/16 0352 03/17/16 0522  WBC 7.6 3.7* 9.3 8.6  HGB 14.1 11.4* 12.1* 12.2*  HCT 40.6 32.9* 34.7* 35.3*  MCV 98.5 99.7 98.0 98.1  PLT 217 171 205 206   Cardiac Enzymes: No results for input(s): CKTOTAL, CKMB, CKMBINDEX, TROPONINI in the last 168 hours. BNP: Invalid input(s): POCBNP CBG: No results for input(s): GLUCAP in the last 168 hours. HbA1C: No results for input(s): HGBA1C in the last 72 hours. Urine analysis:    Component Value Date/Time   COLORURINE AMBER* 03/10/2016 1920   APPEARANCEUR CLEAR 03/10/2016 1920   LABSPEC 1.025 03/10/2016 1920   PHURINE 5.5 03/10/2016 1920   GLUCOSEU NEGATIVE 03/10/2016 1920   HGBUR NEGATIVE 03/10/2016 1920   BILIRUBINUR NEGATIVE 03/10/2016 1920   KETONESUR NEGATIVE 03/10/2016 1920   PROTEINUR NEGATIVE 03/10/2016 1920   NITRITE NEGATIVE 03/10/2016 1920   LEUKOCYTESUR NEGATIVE 03/10/2016 1920   Sepsis Labs: @LABRCNTIP (procalcitonin:4,lacticidven:4) ) Recent Results (from the past 240 hour(s))  Surgical pcr screen     Status: None   Collection Time: 03/11/16  7:23 AM  Result Value Ref Range Status   MRSA, PCR NEGATIVE NEGATIVE Final   Staphylococcus aureus NEGATIVE NEGATIVE Final    Comment:        The Xpert SA Assay (FDA approved for NASAL specimens in patients over 69 years of age), is one component of a comprehensive surveillance program.  Test performance has been validated by [facility] for patients greater than or equal to 79 year old. It is not intended to diagnose infection nor to guide or monitor treatment.      Scheduled Meds: .  enoxaparin (LOVENOX) injection  40 mg Subcutaneous Q24H  . pantoprazole  40 mg Oral QHS  . sodium chloride flush  3 mL Intravenous Q12H   Continuous Infusions: . sodium chloride 75 mL/hr at 03/18/16 [id number]    Procedures/Studies: Ct Abdomen Pelvis Wo Contrast  03/10/2016  CLINICAL DATA:  Abdominal pain, onset today. Nausea and vomiting. Ventral hernia, question incarcerated. EXAM: CT ABDOMEN AND PELVIS WITHOUT CONTRAST TECHNIQUE: Multidetector CT imaging of the abdomen and pelvis was performed following the standard protocol without IV contrast. COMPARISON:  Most recent CT 04/13/2015 FINDINGS: Lower chest: The included lung bases are clear. No pleural effusion. Normal heart size. Liver: Nodular contours consistent with cirrhosis. No evidence of focal lesion allowing for lack contrast. Hepatobiliary: Gallbladder physiologically distended, no calcified stone. No biliary dilatation. Pancreas: No ductal dilatation.  No peripancreatic inflammation. Spleen: Upper normal in size.  Scattered granuloma. Adrenal glands: No nodule. Kidneys: No hydronephrosis or urolithiasis. Kidneys symmetric in size. No perinephric stranding. Stomach/Bowel: Stomach distended with ingested contrast. Small amount contrast in the distal esophagus. Dilated fluid-filled small bowel loops with transition point umbilical hernia. Exiting and distal small bowel loops are decompressed. No pneumatosis.  Small volume stool throughout the colon. Distal colonic diverticulosis, no evidence of diverticulitis. Appendix is normal. Vascular/Lymphatic: No retroperitoneal adenopathy. Abdominal aorta is normal in caliber. Moderate atherosclerosis without aneurysm. Reproductive: Prostate gland normal in size. Bladder: Physiologically distended. Other: Moderate volume intra-abdominal and pelvic ascites. Minimal ascites in the umbilical hernia. No free air. No definite loculated fluid collection. Musculoskeletal: There are no acute or suspicious osseous  abnormalities. IMPRESSION: 1. Small-bowel obstruction secondary to incarcerated umbilical hernia. 2. Cirrhosis with moderate volume intra-abdominal pelvic ascites. Electronically Signed   By: Jeb Levering M.D.   On: 03/10/2016 18:09   Dg Abd 2 Views  03/17/2016  CLINICAL DATA:  Patient with abdominal distension. Follow-up small bowel obstruction. EXAM: ABDOMEN - 2 VIEW COMPARISON:  03/16/2016. FINDINGS: Re- demonstrated multiple gaseous distended loops of small bowel within the central abdomen with decompressed small bowel within the right lower quadrant. Decubitus views demonstrate no evidence for free intraperitoneal air. Overall the small bowel loops are stable to slightly more gaseous distended than on prior exam. IMPRESSION: Stable to slight interval increase in gaseous distention of multiple small bowel loops within the central abdomen. No evidence for free intraperitoneal air. Electronically Signed   By: Lovey Newcomer M.D.   On: 03/17/2016 09:00   Dg Abd 2 Views  03/16/2016  CLINICAL DATA:  Nausea, vomiting, abdominal pain and loose stools. EXAM: ABDOMEN - 2 VIEW COMPARISON:  CT abdomen pelvis 03/10/2016. FINDINGS: There is gaseous distension of small bowel with associated air-fluid levels. Minimal stool in the colon with small amount of gas and stool in the rectum. Lung bases are clear. IMPRESSION: Bowel gas pattern is indicative of a small bowel obstruction. Electronically Signed   By: Lorin Picket M.D.   On: 03/16/2016 10:06    Shaine Mount, DO  [facility] Pager [phone]  If 7PM-7AM, please contact night-coverage [email or web address] Password [email or web address] 03/18/2016, 4:31 PM   LOS: 8 days

## 2016-03-18 NOTE — Progress Notes (Signed)
Patient ID: Kevin Nicholson, male   DOB: 1960-12-05, 55 y.o.   MRN: [id number]     [address] [place], [place] [address]    Phone: [phone] FAX: [phone]     Subjective: No more vomiting. Passing lots of flatus Objective:  Vital signs:  Filed Vitals:   03/17/16 1518 03/17/16 1545 03/17/16 2209 03/18/16 0611  BP: '89/54 92/51 94/55 '$ 95/53  Pulse: 68 64 70 66  Temp: 98.3 F (36.8 C)  97.4 F (36.3 C) 98.2 F (36.8 C)  TempSrc: Oral  Oral Oral  Resp: '16  16 16  '$ Height:      Weight:      SpO2: 98%  97% 97%    Last BM Date: 03/14/16  Intake/Output   Yesterday:  04/29 0701 - 04/30 0700 In: 375 [I.V.:375] Out: -  This shift:    I/O last 3 completed shifts: In: 9528 [P.O.:240; I.V.:1203] Out: 1000 [Urine:1000]    Physical Exam: General: Pt awake/alert/oriented x4 in no acute distress  Abdomen: Soft. distended. Mildly tender at incisions only, incision without erythema, edges are approximated. bowel sounds are present. No evidence of peritonitis. No incarcerated hernias.     Problem List:   Principal Problem:   SBO (small bowel obstruction) (HCC) Active Problems:   Alcoholic cirrhosis of liver with ascites (HCC)   Incarcerated umbilical hernia   Malnutrition of moderate degree   Nausea and vomiting    Results:   Labs: Results for orders placed or performed during the hospital encounter of 03/10/16 (from the past 48 hour(s))  Basic metabolic panel     Status: Abnormal   Collection Time: 03/17/16  5:22 AM  Result Value Ref Range   Sodium 136 135 - 145 mmol/L   Potassium 4.3 3.5 - 5.1 mmol/L   Chloride 98 (L) 101 - 111 mmol/L   CO2 29 22 - 32 mmol/L   Glucose, Bld 111 (H) 65 - 99 mg/dL   BUN 38 (H) 6 - 20 mg/dL   Creatinine, Ser 1.21 0.61 - 1.24 mg/dL   Calcium 7.9 (L) 8.9 - 10.3 mg/dL   GFR calc non Af Amer >60 >60 mL/min   GFR calc Af Amer >60 >60 mL/min    Comment:  (NOTE) The eGFR has been calculated using the CKD EPI equation. This calculation has not been validated in all clinical situations. eGFR's persistently <60 mL/min signify possible Chronic Kidney Disease.    Anion gap 9 5 - 15  CBC     Status: Abnormal   Collection Time: 03/17/16  5:22 AM  Result Value Ref Range   WBC 8.6 4.0 - 10.5 K/uL   RBC 3.60 (L) 4.22 - 5.81 MIL/uL   Hemoglobin 12.2 (L) 13.0 - 17.0 g/dL   HCT 35.3 (L) 39.0 - 52.0 %   MCV 98.1 78.0 - 100.0 fL   MCH 33.9 26.0 - 34.0 pg   MCHC 34.6 30.0 - 36.0 g/dL   RDW 13.3 11.5 - 15.5 %   Platelets 206 150 - 400 K/uL  Magnesium     Status: None   Collection Time: 03/17/16  5:22 AM  Result Value Ref Range   Magnesium 2.0 1.7 - 2.4 mg/dL  Basic metabolic panel     Status: Abnormal   Collection Time: 03/18/16  4:14 AM  Result Value Ref Range   Sodium 138 135 - 145 mmol/L   Potassium 4.1 3.5 - 5.1  mmol/L   Chloride 101 101 - 111 mmol/L   CO2 27 22 - 32 mmol/L   Glucose, Bld 95 65 - 99 mg/dL   BUN 42 (H) 6 - 20 mg/dL   Creatinine, Ser 1.25 (H) 0.61 - 1.24 mg/dL   Calcium 7.8 (L) 8.9 - 10.3 mg/dL   GFR calc non Af Amer >60 >60 mL/min   GFR calc Af Amer >60 >60 mL/min    Comment: (NOTE) The eGFR has been calculated using the CKD EPI equation. This calculation has not been validated in all clinical situations. eGFR's persistently <60 mL/min signify possible Chronic Kidney Disease.    Anion gap 10 5 - 15    Imaging / Studies: Dg Abd 2 Views  03/17/2016  CLINICAL DATA:  Patient with abdominal distension. Follow-up small bowel obstruction. EXAM: ABDOMEN - 2 VIEW COMPARISON:  03/16/2016. FINDINGS: Re- demonstrated multiple gaseous distended loops of small bowel within the central abdomen with decompressed small bowel within the right lower quadrant. Decubitus views demonstrate no evidence for free intraperitoneal air. Overall the small bowel loops are stable to slightly more gaseous distended than on prior exam. IMPRESSION:  Stable to slight interval increase in gaseous distention of multiple small bowel loops within the central abdomen. No evidence for free intraperitoneal air. Electronically Signed   By: Lovey Newcomer M.D.   On: 03/17/2016 09:00   Dg Abd 2 Views  03/16/2016  CLINICAL DATA:  Nausea, vomiting, abdominal pain and loose stools. EXAM: ABDOMEN - 2 VIEW COMPARISON:  CT abdomen pelvis 03/10/2016. FINDINGS: There is gaseous distension of small bowel with associated air-fluid levels. Minimal stool in the colon with small amount of gas and stool in the rectum. Lung bases are clear. IMPRESSION: Bowel gas pattern is indicative of a small bowel obstruction. Electronically Signed   By: Lorin Picket M.D.   On: 03/16/2016 10:06    Medications / Allergies:  Scheduled Meds: . enoxaparin (LOVENOX) injection  40 mg Subcutaneous Q24H  . pantoprazole  40 mg Oral QHS  . sodium chloride flush  3 mL Intravenous Q12H   Continuous Infusions: . sodium chloride 75 mL/hr at 03/16/16 0636   PRN Meds:.HYDROmorphone (DILAUDID) injection, morphine injection, ondansetron (ZOFRAN) IV, oxyCODONE, sodium chloride flush  Antibiotics: Anti-infectives    Start     Dose/Rate Route Frequency Ordered Stop   03/11/16 1015  cefOXitin (MEFOXIN) 2 g in dextrose 5 % 50 mL IVPB     2 g 100 mL/hr over 30 Minutes Intravenous  Once 03/11/16 1011 03/11/16 0915       Assessment/Plan Strangulated umbilical hernia POD#6 open umbilical hernia repair, SBR---Dr. Earnestine Mealing op ileus -clears for now, check AXR -mobilize, IS FEN-ivf VTE prophylaxis-lovenox, SCDs Cirrhosis of liver/Etoh abuse-per primary team  Dispo-ileus     03/18/2016 7:34 AM

## 2016-03-19 ENCOUNTER — Encounter (HOSPITAL_COMMUNITY): Payer: Self-pay | Admitting: Radiology

## 2016-03-19 ENCOUNTER — Inpatient Hospital Stay (HOSPITAL_COMMUNITY): Payer: Medicaid Other

## 2016-03-19 LAB — COMPREHENSIVE METABOLIC PANEL
ALK PHOS: 95 U/L (ref 38–126)
ALT: 17 U/L (ref 17–63)
AST: 20 U/L (ref 15–41)
Albumin: 2.4 g/dL — ABNORMAL LOW (ref 3.5–5.0)
Anion gap: 9 (ref 5–15)
BUN: 25 mg/dL — AB (ref 6–20)
CALCIUM: 7.6 mg/dL — AB (ref 8.9–10.3)
CHLORIDE: 104 mmol/L (ref 101–111)
CO2: 25 mmol/L (ref 22–32)
CREATININE: 0.89 mg/dL (ref 0.61–1.24)
GFR calc Af Amer: >60 mL/min (ref >60)
Glucose, Bld: 94 mg/dL (ref 65–99)
Potassium: 3.8 mmol/L (ref 3.5–5.1)
SODIUM: 138 mmol/L (ref 135–145)
Total Bilirubin: 1.8 mg/dL — ABNORMAL HIGH (ref 0.3–1.2)
Total Protein: 5.6 g/dL — ABNORMAL LOW (ref 6.5–8.1)

## 2016-03-19 LAB — CBC
HCT: 33.6 % — ABNORMAL LOW (ref 39.0–52.0)
Hemoglobin: 11.8 g/dL — ABNORMAL LOW (ref 13.0–17.0)
MCH: 34.6 pg — AB (ref 26.0–34.0)
MCHC: 35.1 g/dL (ref 30.0–36.0)
MCV: 98.5 fL (ref 78.0–100.0)
PLATELETS: 209 10*3/uL (ref 150–400)
RBC: 3.41 MIL/uL — ABNORMAL LOW (ref 4.22–5.81)
RDW: 13.6 % (ref 11.5–15.5)
WBC: 9.8 10*3/uL (ref 4.0–10.5)

## 2016-03-19 LAB — BASIC METABOLIC PANEL
ANION GAP: 10 (ref 5–15)
BUN: 31 mg/dL — AB (ref 6–20)
CHLORIDE: 105 mmol/L (ref 101–111)
CO2: 25 mmol/L (ref 22–32)
Calcium: 7.8 mg/dL — ABNORMAL LOW (ref 8.9–10.3)
Creatinine, Ser: 0.98 mg/dL (ref 0.61–1.24)
GFR calc Af Amer: >60 mL/min (ref >60)
GLUCOSE: 90 mg/dL (ref 65–99)
POTASSIUM: 4.1 mmol/L (ref 3.5–5.1)
Sodium: 140 mmol/L (ref 135–145)

## 2016-03-19 LAB — APTT: APTT: 34 s (ref 24–37)

## 2016-03-19 LAB — PROTIME-INR
INR: 1.3 (ref 0.00–1.49)
Prothrombin Time: 16.3 seconds — ABNORMAL HIGH (ref 11.6–15.2)

## 2016-03-19 MED ORDER — IOPAMIDOL (ISOVUE-300) INJECTION 61%
100.0000 mL | Freq: Once | INTRAVENOUS | Status: AC | PRN
  Administered 2016-03-19: 100 mL via INTRAVENOUS

## 2016-03-19 MED ORDER — DIATRIZOATE MEGLUMINE & SODIUM 66-10 % PO SOLN
30.0000 mL | ORAL | Status: AC
  Administered 2016-03-19: 30 mL via ORAL
  Filled 2016-03-19 (×2): qty 30

## 2016-03-19 NOTE — Progress Notes (Signed)
PROGRESS NOTE  Kevin Nicholson [id number] DOB: 12-27-1960 DOA: 03/10/2016 PCP: Minerva Ends, MD Outpatient Specialists: Dr. Earma Reading  Brief History:  55 y.o. male with medical history significant of cirrhosis of the liver with ascites and grade 1 esophageal varicies.Cirrhosis felt to be EtOH etiology (not drinking now). He has monthly paracentesis for ascites. Patient presented to the ED with c/o N/V, abdominal pain, loose stool that started [id number] pm.  Umbilical hernia was found to be painful, hard, non-reducible. CT scan confirmed obstruction due to incarcerated umbilical hernia [id number]: NGT placed, [facility]CS consulted 4/23: diagnostic laparoscopy, open HERNIA REPAIR UMBILICAL, small bowel resection. Postoperatively, the patient had a postoperative ileus which required NG tube decompression. The patient accidentally sneezed out the NG tube on 03/13/2016. Fortunately, it appears that his ileus has resolved as the patient has had numerous bowel movements. Unfortunately, the patient's abdomen became more distended again after his diet was advanced.  03/19/2016 repeat CT abdomen and pelvis reveals proximal small bowel dilatation to approximately the mid to distal jejunum with relative transition zone in the mid abdomen; anastomosis was patent. There was no incarceration.   Assessment/Plan: Incarcerated umbilical hernia causing SBO-->post op ileus -didn't improve with NPO, NG decompression -s/p open hernia repair and SB resection 03/11/16  -Patient developed postoperative ileus requiring NG tube decompression--> improved initially -03/15/16--abd more distended with N/V-->back to ice chips -03/17/2016--AXR-unchanged gaseous distention -03/19/16--CT abdomen and pelvis reveals proximal small bowel dilatation to approximately the mid to distal jejunum with relative transition zone in the mid abdomen; anastomosis was patent. There was no incarceration. -Ambulate,  Incentive spirometry  Alcoholic cirrhosis of liver  -MELD score of 14 -Child class B due to moderate ascites and grade 1 varices -required monthly paracentesis, until diuretic dose increased, last 2-56months  -03/17/2016--hold additional doses of furosemide, spironolactone and nadolol secondary to soft blood pressures  Tobacco use -suspect undiagnosed COPD -counseled, nebs PRN  Moderate malnutrition  DVT prophylaxis: lovenox Code Status: Full Family Communication: No family in room  Consultants: CCS Dr.Thomas  Procedures:PROCEDURE: 03/11/16--diagnostic laparoscopy, open HERNIA REPAIR UMBILICAL ADULT, small bowel resection    Disposition Plan: Home when cleared by CCS  Subjective: Patient feels frustrated he has to be here for so long. Denies any nausea, vomiting. Has small bowel movement without any blood. Passing flatus. Denies any chest pain shortness breath, dysuria, hematuria.  Objective: Filed Vitals:   03/18/16 1308 03/18/16 2158 03/19/16 0602 03/19/16 1417  BP: 108/65 108/52 99/56 100/59  Pulse: 68 72 70 68  Temp: 97.7 F (36.5 C) 98 F (36.7 C) 97.5 F (36.4 C) 98.1 F (36.7 C)  TempSrc: Oral Oral Oral Oral  Resp: 16 18 18 18   Height:      Weight:      SpO2: 100% 96% 100% 99%    Intake/Output Summary (Last 24 hours) at 03/19/16 1735 Last data filed at 03/19/16 1418  Gross per 24 hour  Intake   1485 ml  Output    200 ml  Net   1285 ml   Weight change:  Exam:   General:  Pt is alert, follows commands appropriately, not in acute distress  HEENT: No icterus, No thrush, No neck mass, [place]/AT  Cardiovascular: RRR, S1/S2, no rubs, no gallops  Respiratory: CTA bilaterally, no wheezing, no crackles, no rhonchi  Abdomen: Soft/+BS, non tender, mod distension, no guarding  Extremities: trace LE edema, No lymphangitis, No petechiae, No rashes, no synovitis   Data  Reviewed: I have personally reviewed following labs and imaging studies Basic Metabolic  Panel:  Recent Labs Lab 03/15/16 0413 03/16/16 0352 03/17/16 0522 03/18/16 0414 03/19/16 0413 03/19/16 0908  NA 134* 138 136 138 140 138  K 3.7 3.6 4.3 4.1 4.1 3.8  CL 99* 96* 98* 101 105 104  CO2 24 29 29 27 25 25   GLUCOSE 126* 128* 111* 95 90 94  BUN 27* 34* 38* 42* 31* 25*  CREATININE 0.94 1.21 1.21 1.25* 0.98 0.89  CALCIUM 8.2* 8.3* 7.9* 7.8* 7.8* 7.6*  MG 1.8 2.0 2.0  --   --   --    Liver Function Tests:  Recent Labs Lab 03/19/16 0908  AST 20  ALT 17  ALKPHOS 95  BILITOT 1.8*  PROT 5.6*  ALBUMIN 2.4*   No results for input(s): LIPASE, AMYLASE in the last 168 hours. No results for input(s): AMMONIA in the last 168 hours. Coagulation Profile:  Recent Labs Lab 03/19/16 0908  INR 1.30   CBC:  Recent Labs Lab 03/14/16 0431 03/16/16 0352 03/17/16 0522 03/19/16 0908  WBC 3.7* 9.3 8.6 9.8  HGB 11.4* 12.1* 12.2* 11.8*  HCT 32.9* 34.7* 35.3* 33.6*  MCV 99.7 98.0 98.1 98.5  PLT 171 205 206 209   Cardiac Enzymes: No results for input(s): CKTOTAL, CKMB, CKMBINDEX, TROPONINI in the last 168 hours. BNP: Invalid input(s): POCBNP CBG: No results for input(s): GLUCAP in the last 168 hours. HbA1C: No results for input(s): HGBA1C in the last 72 hours. Urine analysis:    Component Value Date/Time   COLORURINE AMBER* 03/10/2016 1920   APPEARANCEUR CLEAR 03/10/2016 1920   LABSPEC 1.025 03/10/2016 1920   PHURINE 5.5 03/10/2016 1920   GLUCOSEU NEGATIVE 03/10/2016 1920   HGBUR NEGATIVE 03/10/2016 1920   BILIRUBINUR NEGATIVE 03/10/2016 1920   KETONESUR NEGATIVE 03/10/2016 1920   PROTEINUR NEGATIVE 03/10/2016 1920   NITRITE NEGATIVE 03/10/2016 1920   LEUKOCYTESUR NEGATIVE 03/10/2016 1920   Sepsis Labs: @LABRCNTIP (procalcitonin:4,lacticidven:4) ) Recent Results (from the past 240 hour(s))  Surgical pcr screen     Status: None   Collection Time: 03/11/16  7:23 AM  Result Value Ref Range Status   MRSA, PCR NEGATIVE NEGATIVE Final   Staphylococcus aureus  NEGATIVE NEGATIVE Final    Comment:        The Xpert SA Assay (FDA approved for NASAL specimens in patients over 7 years of age), is one component of a comprehensive surveillance program.  Test performance has been validated by [facility] for patients greater than or equal to 61 year old. It is not intended to diagnose infection nor to guide or monitor treatment.      Scheduled Meds: . enoxaparin (LOVENOX) injection  40 mg Subcutaneous Q24H  . pantoprazole  40 mg Oral QHS  . sodium chloride flush  3 mL Intravenous Q12H   Continuous Infusions: . sodium chloride 75 mL/hr at 03/19/16 1039    Procedures/Studies: Ct Abdomen Pelvis Wo Contrast  03/10/2016  CLINICAL DATA:  Abdominal pain, onset today. Nausea and vomiting. Ventral hernia, question incarcerated. EXAM: CT ABDOMEN AND PELVIS WITHOUT CONTRAST TECHNIQUE: Multidetector CT imaging of the abdomen and pelvis was performed following the standard protocol without IV contrast. COMPARISON:  Most recent CT 04/13/2015 FINDINGS: Lower chest: The included lung bases are clear. No pleural effusion. Normal heart size. Liver: Nodular contours consistent with cirrhosis. No evidence of focal lesion allowing for lack contrast. Hepatobiliary: Gallbladder physiologically distended, no calcified stone. No biliary dilatation. Pancreas: No ductal dilatation.  No peripancreatic inflammation. Spleen: Upper normal in size.  Scattered granuloma. Adrenal glands: No nodule. Kidneys: No hydronephrosis or urolithiasis. Kidneys symmetric in size. No perinephric stranding. Stomach/Bowel: Stomach distended with ingested contrast. Small amount contrast in the distal esophagus. Dilated fluid-filled small bowel loops with transition point umbilical hernia. Exiting and distal small bowel loops are decompressed. No pneumatosis. Small volume stool throughout the colon. Distal colonic diverticulosis, no evidence of diverticulitis. Appendix is normal. Vascular/Lymphatic: No  retroperitoneal adenopathy. Abdominal aorta is normal in caliber. Moderate atherosclerosis without aneurysm. Reproductive: Prostate gland normal in size. Bladder: Physiologically distended. Other: Moderate volume intra-abdominal and pelvic ascites. Minimal ascites in the umbilical hernia. No free air. No definite loculated fluid collection. Musculoskeletal: There are no acute or suspicious osseous abnormalities. IMPRESSION: 1. Small-bowel obstruction secondary to incarcerated umbilical hernia. 2. Cirrhosis with moderate volume intra-abdominal pelvic ascites. Electronically Signed   By: Jeb Levering M.D.   On: 03/10/2016 18:09   Ct Abdomen Pelvis W Contrast  03/19/2016  CLINICAL DATA:  Persistent abdominal pain and obstruction on recent plain film EXAM: CT ABDOMEN AND PELVIS WITH CONTRAST TECHNIQUE: Multidetector CT imaging of the abdomen and pelvis was performed using the standard protocol following bolus administration of intravenous contrast. CONTRAST:  111mL ISOVUE-300 IOPAMIDOL (ISOVUE-300) INJECTION 61% COMPARISON:  03/19/2016, 03/10/2016 FINDINGS: Lung bases are free of acute infiltrate or sizable effusion. Cirrhotic change of the liver is again identified with significant ascites similar to that seen on the prior study. The spleen, adrenal glands and pancreas are normal in their CT appearance. The gallbladder and kidneys are within normal limits. Continued proximal small bowel dilatation is identified. There is a relative transition zone in the mid abdomen on image number 47 of series 2. No definitive mass or incarceration is noted associated with these changes. Postoperative change anteriorly is seen consistent with reduction of the previously seen hernia. No incarcerated bowel loops are seen although some mild fluid remains. An anastomosis within the distal small bowel is noted consistent with the given clinical history. This anastomosis appears patent. The appendix is within normal limits. Mild  aortoiliac calcifications are seen. The bladder is decompressed. No acute bony abnormality is noted. IMPRESSION: Changes of cirrhosis and moderate ascites similar to that seen on prior CT examination. Proximal small bowel dilatation to approximately the mid to distal jejunum. A relative transition zone is noted in the mid abdomen just below the surgical site although no true mass or incarceration is identified to correspond with these changes. Electronically Signed   By: Inez Catalina M.D.   On: 03/19/2016 13:11   Dg Abd 2 Views  03/19/2016  CLINICAL DATA:  Abdominal distension, umbilical hernia repair 8 days ago. EXAM: ABDOMEN - 2 VIEW COMPARISON:  03/17/2016. FINDINGS: Persistent gaseous distention of small bowel with associated air-fluid levels. Minimal stool and gas in the rectosigmoid colon. IMPRESSION: Bowel gas pattern is indicative of a small bowel obstruction. Electronically Signed   By: Lorin Picket M.D.   On: 03/19/2016 08:38   Dg Abd 2 Views  03/17/2016  CLINICAL DATA:  Patient with abdominal distension. Follow-up small bowel obstruction. EXAM: ABDOMEN - 2 VIEW COMPARISON:  03/16/2016. FINDINGS: Re- demonstrated multiple gaseous distended loops of small bowel within the central abdomen with decompressed small bowel within the right lower quadrant. Decubitus views demonstrate no evidence for free intraperitoneal air. Overall the small bowel loops are stable to slightly more gaseous distended than on prior exam. IMPRESSION: Stable to slight interval increase in gaseous distention of multiple  small bowel loops within the central abdomen. No evidence for free intraperitoneal air. Electronically Signed   By: Lovey Newcomer M.D.   On: 03/17/2016 09:00   Dg Abd 2 Views  03/16/2016  CLINICAL DATA:  Nausea, vomiting, abdominal pain and loose stools. EXAM: ABDOMEN - 2 VIEW COMPARISON:  CT abdomen pelvis 03/10/2016. FINDINGS: There is gaseous distension of small bowel with associated air-fluid levels.  Minimal stool in the colon with small amount of gas and stool in the rectum. Lung bases are clear. IMPRESSION: Bowel gas pattern is indicative of a small bowel obstruction. Electronically Signed   By: Lorin Picket M.D.   On: 03/16/2016 10:06    Rito Lecomte, DO  [facility] Pager [phone]  If 7PM-7AM, please contact night-coverage [email or web address] Password [email or web address] 03/19/2016, 5:35 PM   LOS: 9 days

## 2016-03-19 NOTE — Progress Notes (Signed)
[facility] Progress Note  8 Days Post-Op  Subjective: Pt feels fairly good.  No N/V, not much abdominal pain.  Feel distended chronically, but feels almost to normal.  Ambulating OOB and passing good flatus.  Had a small normal BM yesterday.    Objective: Vital signs in last 24 hours: Temp:  [97.5 F (36.4 C)-98 F (36.7 C)] 97.5 F (36.4 C) (05/01 0602) Pulse Rate:  [68-72] 70 (05/01 0602) Resp:  [16-18] 18 (05/01 0602) BP: (99-108)/(52-65) 99/56 mmHg (05/01 0602) SpO2:  [96 %-100 %] 100 % (05/01 0602) Last BM Date: 03/14/16  Intake/Output from previous day: 04/30 0701 - 05/01 0700 In: 1725 [I.V.:1725] Out: -  Intake/Output this shift:    PE: Gen:  Alert, NAD, pleasant Abd: Overall soft, distended/firmer especially in upper abdomen, mild tenderness over incision sites, +BS, no HSM, incisions C/D/I   Lab Results:   Recent Labs  03/17/16 0522  WBC 8.6  HGB 12.2*  HCT 35.3*  PLT 206   BMET  Recent Labs  03/18/16 0414 03/19/16 0413  NA 138 140  K 4.1 4.1  CL 101 105  CO2 27 25  GLUCOSE 95 90  BUN 42* 31*  CREATININE 1.25* 0.98  CALCIUM 7.8* 7.8*   PT/INR No results for input(s): LABPROT, INR in the last 72 hours. CMP     Component Value Date/Time   NA 140 03/19/2016 0413   K 4.1 03/19/2016 0413   CL 105 03/19/2016 0413   CO2 25 03/19/2016 0413   GLUCOSE 90 03/19/2016 0413   BUN 31* 03/19/2016 0413   CREATININE 0.98 03/19/2016 0413   CREATININE 1.00 01/30/2016 1547   CALCIUM 7.8* 03/19/2016 0413   PROT 6.2* 03/12/2016 0416   ALBUMIN 3.0* 03/12/2016 0416   AST 18 03/12/2016 0416   ALT 16* 03/12/2016 0416   ALKPHOS 82 03/12/2016 0416   BILITOT 2.6* 03/12/2016 0416   GFRNONAA >60 03/19/2016 0413   GFRNONAA 84 01/30/2016 1547   GFRAA >60 03/19/2016 0413   GFRAA >89 01/30/2016 1547   Lipase     Component Value Date/Time   LIPASE 38 03/10/2016 1553       Studies/Results: No results  found.  Anti-infectives: Anti-infectives    Start     Dose/Rate Route Frequency Ordered Stop   03/11/16 1015  cefOXitin (MEFOXIN) 2 g in dextrose 5 % 50 mL IVPB     2 g 100 mL/hr over 30 Minutes Intravenous  Once 03/11/16 1011 03/11/16 0915       Assessment/Plan Strangulated umbilical hernia POD#8 open umbilical hernia repair, SBR---Dr. Marcello Moores Post op ileus -NPO for now, xray still impressive for ileus vs SBO.  Ordered CT scan to check for ascites and make sure no transition zone -Check LFT's, PT/INR, CBC -Mobilize, IS  FEN-IVF VTE prophylaxis-lovenox, SCDs Cirrhosis of liver/Etoh abuse - check LFT's and hepatic function, he may need ascites tapped, ?bacterial, no fevers. Dispo-ileus, pending CT scan    LOS: 9 days    Nat Christen 03/19/2016, 8:36 AM Pager: [phone]  (7am - 4:30pm M-F; 7am - 11:30am Sa/Su)

## 2016-03-20 ENCOUNTER — Telehealth: Payer: Self-pay | Admitting: Gastroenterology

## 2016-03-20 ENCOUNTER — Inpatient Hospital Stay (HOSPITAL_COMMUNITY): Payer: Medicaid Other

## 2016-03-20 LAB — BODY FLUID CELL COUNT WITH DIFFERENTIAL
Lymphs, Fluid: 37 %
Monocyte-Macrophage-Serous Fluid: 41 % — ABNORMAL LOW (ref 50–90)
NEUTROPHIL FLUID: 22 % (ref 0–25)
WBC FLUID: 399 uL (ref 0–1000)

## 2016-03-20 LAB — GRAM STAIN

## 2016-03-20 MED ORDER — FUROSEMIDE 40 MG PO TABS
40.0000 mg | ORAL_TABLET | Freq: Two times a day (BID) | ORAL | Status: DC
  Administered 2016-03-20 – 2016-03-21 (×2): 40 mg via ORAL
  Filled 2016-03-20 (×4): qty 1

## 2016-03-20 MED ORDER — ALBUMIN HUMAN 25 % IV SOLN
50.0000 g | Freq: Once | INTRAVENOUS | Status: AC
  Administered 2016-03-20: 50 g via INTRAVENOUS
  Filled 2016-03-20: qty 200

## 2016-03-20 MED ORDER — SPIRONOLACTONE 100 MG PO TABS
100.0000 mg | ORAL_TABLET | Freq: Two times a day (BID) | ORAL | Status: DC
  Administered 2016-03-20 – 2016-03-21 (×2): 100 mg via ORAL
  Filled 2016-03-20 (×4): qty 1

## 2016-03-20 NOTE — Procedures (Signed)
Ultrasound-guided diagnostic and therapeutic paracentesis performed yielding 5.2 liters of amber colored fluid. No immediate complications.  Latica Hohmann E 1:39 PM 03/20/2016

## 2016-03-20 NOTE — Progress Notes (Signed)
[facility] Progress Note  9 Days Post-Op  Subjective: Pt frustrated, wants to go home.  Wonders when his paracentesis will be done.  Hungry/thirsty.  Says his abdomen is fuller today.  Had a BM and lots of flatus.  Objective: Vital signs in last 24 hours: Temp:  [97.7 F (36.5 C)-98.3 F (36.8 C)] 98.3 F (36.8 C) (05/02 0421) Pulse Rate:  [68-85] 85 (05/02 0421) Resp:  [18-20] 20 (05/02 0421) BP: (100-116)/(56-67) 116/56 mmHg (05/02 0421) SpO2:  [99 %] 99 % (05/02 0421) Last BM Date: 03/20/16  Intake/Output from previous day: 05/01 0701 - 05/02 0700 In: 1860 [P.O.:60; I.V.:1800] Out: 700 [Urine:700] Intake/Output this shift:    PE: Gen: Alert, NAD, pleasant Abd: Overall soft, distended and tense, mild tenderness over incision sites, +BS, no HSM, incisions C/D/I    Lab Results:   Recent Labs  03/19/16 0908  WBC 9.8  HGB 11.8*  HCT 33.6*  PLT 209   BMET  Recent Labs  03/19/16 0413 03/19/16 0908  NA 140 138  K 4.1 3.8  CL 105 104  CO2 25 25  GLUCOSE 90 94  BUN 31* 25*  CREATININE 0.98 0.89  CALCIUM 7.8* 7.6*   PT/INR  Recent Labs  03/19/16 0908  LABPROT 16.3*  INR 1.30   CMP     Component Value Date/Time   NA 138 03/19/2016 0908   K 3.8 03/19/2016 0908   CL 104 03/19/2016 0908   CO2 25 03/19/2016 0908   GLUCOSE 94 03/19/2016 0908   BUN 25* 03/19/2016 0908   CREATININE 0.89 03/19/2016 0908   CREATININE 1.00 01/30/2016 1547   CALCIUM 7.6* 03/19/2016 0908   PROT 5.6* 03/19/2016 0908   ALBUMIN 2.4* 03/19/2016 0908   AST 20 03/19/2016 0908   ALT 17 03/19/2016 0908   ALKPHOS 95 03/19/2016 0908   BILITOT 1.8* 03/19/2016 0908   GFRNONAA >60 03/19/2016 0908   GFRNONAA 84 01/30/2016 1547   GFRAA >60 03/19/2016 0908   GFRAA >89 01/30/2016 1547   Lipase     Component Value Date/Time   LIPASE 38 03/10/2016 1553       Studies/Results: Ct Abdomen Pelvis W Contrast  03/19/2016  CLINICAL DATA:  Persistent abdominal pain and  obstruction on recent plain film EXAM: CT ABDOMEN AND PELVIS WITH CONTRAST TECHNIQUE: Multidetector CT imaging of the abdomen and pelvis was performed using the standard protocol following bolus administration of intravenous contrast. CONTRAST:  171mL ISOVUE-300 IOPAMIDOL (ISOVUE-300) INJECTION 61% COMPARISON:  03/19/2016, 03/10/2016 FINDINGS: Lung bases are free of acute infiltrate or sizable effusion. Cirrhotic change of the liver is again identified with significant ascites similar to that seen on the prior study. The spleen, adrenal glands and pancreas are normal in their CT appearance. The gallbladder and kidneys are within normal limits. Continued proximal small bowel dilatation is identified. There is a relative transition zone in the mid abdomen on image number 47 of series 2. No definitive mass or incarceration is noted associated with these changes. Postoperative change anteriorly is seen consistent with reduction of the previously seen hernia. No incarcerated bowel loops are seen although some mild fluid remains. An anastomosis within the distal small bowel is noted consistent with the given clinical history. This anastomosis appears patent. The appendix is within normal limits. Mild aortoiliac calcifications are seen. The bladder is decompressed. No acute bony abnormality is noted. IMPRESSION: Changes of cirrhosis and moderate ascites similar to that seen on prior CT examination. Proximal small bowel dilatation to approximately  the mid to distal jejunum. A relative transition zone is noted in the mid abdomen just below the surgical site although no true mass or incarceration is identified to correspond with these changes. Electronically Signed   By: Inez Catalina M.D.   On: 03/19/2016 13:11   Dg Abd 2 Views  03/19/2016  CLINICAL DATA:  Abdominal distension, umbilical hernia repair 8 days ago. EXAM: ABDOMEN - 2 VIEW COMPARISON:  03/17/2016. FINDINGS: Persistent gaseous distention of small bowel with  associated air-fluid levels. Minimal stool and gas in the rectosigmoid colon. IMPRESSION: Bowel gas pattern is indicative of a small bowel obstruction. Electronically Signed   By: Lorin Picket M.D.   On: 03/19/2016 08:38    Anti-infectives: Anti-infectives    Start     Dose/Rate Route Frequency Ordered Stop   03/11/16 1015  cefOXitin (MEFOXIN) 2 g in dextrose 5 % 50 mL IVPB     2 g 100 mL/hr over 30 Minutes Intravenous  Once 03/11/16 1011 03/11/16 0915       Assessment/Plan Strangulated umbilical hernia POD#9 open umbilical hernia repair, SBR---Dr. Earnestine Mealing op ileus -Paracentesis today, hopefully resume clears after procedure -Ileus seems to be resolving, but wonder if his ascites is preventing him from fully improving.  He says they normally take off "25lbs" once a month. -Having BM's and lots of flatus -CT scan shows relative transition zone, but no mass or incarceration to correspond with bowel dilitaton -Bili 1.8, ALT/AST normal, Alkphos normal, INR/APTT normal -Mobilize, IS  -No staples to be removed FEN-IVF VTE prophylaxis-lovenox, SCDs Cirrhosis of liver/Etoh abuse - pending paracentesis today and cultures Dispo-ileus, but having BM's    LOS: 10 days    Nat Christen 03/20/2016, 8:42 AM Pager: [phone]  (7am - 4:30pm M-F; 7am - 11:30am Sa/Su)

## 2016-03-20 NOTE — Progress Notes (Signed)
PROGRESS NOTE  Kevin Nicholson [id number] DOB: 1961-04-20 DOA: 03/10/2016 PCP: Minerva Ends, MD Outpatient Specialists: Dr. Earma Reading  Brief History:  55 y.o. male with medical history significant of cirrhosis of the liver with ascites and grade 1 esophageal varicies.Cirrhosis felt to be EtOH etiology (not drinking now). He has monthly paracentesis for ascites. Patient presented to the ED with c/o N/V, abdominal pain, loose stool that started [id number] pm.  Umbilical hernia was found to be painful, hard, non-reducible. CT scan confirmed obstruction due to incarcerated umbilical hernia [id number]: NGT placed, [facility] consulted 4/23: diagnostic laparoscopy, open HERNIA REPAIR UMBILICAL, small bowel resection. Postoperatively, the patient had a postoperative ileus which required NG tube decompression. The patient accidentally sneezed out the NG tube on 03/13/2016. Fortunately, it appears that his ileus has resolved as the patient has had numerous bowel movements. Unfortunately, the patient's abdomen became more distended again after his diet was advanced. 03/19/2016 repeat CT abdomen and pelvis reveals proximal small bowel dilatation to approximately the mid to distal jejunum with relative transition zone in the mid abdomen; anastomosis was patent. There was no incarceration.   Assessment/Plan: Incarcerated umbilical hernia causing SBO-->post op ileus -didn't improve with NPO, NG decompression -s/p open hernia repair and SB resection 03/11/16  -Patient developed postoperative ileus requiring NG tube decompression--> improved initially -03/15/16--abd more distended with N/V-->back to ice chips -03/17/2016--AXR-unchanged gaseous distention -03/19/16--CT abdomen and pelvis reveals proximal small bowel dilatation to approximately the mid to distal jejunum with relative transition zone in the mid abdomen; anastomosis was patent. There was no incarceration. -ascites  accumulation contributing slow improvement of ileus -03/20/16--paracentesis-->5.2L removed -Ambulate, Incentive spirometry  Alcoholic cirrhosis of liver  -MELD score of 14 -Child class B due to moderate ascites and grade 1 varices -required monthly paracentesis, until diuretic dose increased, last 2-72months ago prior to hospitalization -ascites accumulation contributing slow improvement of ileus -03/20/16--paracentesis-->5.2L removed -03/20/16--restart lasix and spironolactone; hold nadolol due to soft BP  Tobacco use -suspect undiagnosed COPD -counseled, nebs PRN  Moderate malnutrition  DVT prophylaxis: lovenox Code Status: Full Family Communication: No family in room  Consultants: [facility] Dr.Thomas  Procedures:PROCEDURE: 03/11/16--diagnostic laparoscopy, open HERNIA REPAIR UMBILICAL ADULT, small bowel resection  Disposition Plan: Home when cleared by [facility]  Subjective: Had BM today.  Passing flatus.  Patient denies fevers, chills, headache, chest pain, dyspnea, nausea, vomiting, diarrhea, abdominal pain, dysuria, hematuria   Objective: Filed Vitals:   03/20/16 1233 03/20/16 1400 03/20/16 2017 03/20/16 2117  BP: 125/64 90/44 96/48  104/54  Pulse: 83 74 72 71  Temp: 98.1 F (36.7 C) 98.2 F (36.8 C) 97.9 F (36.6 C) 97.9 F (36.6 C)  TempSrc: Oral Oral Oral Oral  Resp:  18 17 16   Height:      Weight:      SpO2: 100% 98% 100% 100%    Intake/Output Summary (Last 24 hours) at 03/20/16 2201 Last data filed at 03/20/16 2120  Gross per 24 hour  Intake 1364.25 ml  Output   1225 ml  Net 139.25 ml   Weight change:  Exam:   General:  Pt is alert, follows commands appropriately, not in acute distress  HEENT: No icterus, No thrush, No neck mass, [place]/AT  Cardiovascular: RRR, S1/S2, no rubs, no gallops  Respiratory: CTA bilaterally, no wheezing, no crackles, no rhonchi  Abdomen: Soft/+BS, non tender, mildly distended, no guarding  Extremities: No edema, No lymphangitis,  No petechiae, No rashes, no synovitis  Data Reviewed: I have personally reviewed following labs and imaging studies Basic Metabolic Panel:  Recent Labs Lab 03/15/16 0413 03/16/16 0352 03/17/16 0522 03/18/16 0414 03/19/16 0413 03/19/16 0908  NA 134* 138 136 138 140 138  K 3.7 3.6 4.3 4.1 4.1 3.8  CL 99* 96* 98* 101 105 104  CO2 24 29 29 27 25 25   GLUCOSE 126* 128* 111* 95 90 94  BUN 27* 34* 38* 42* 31* 25*  CREATININE 0.94 1.21 1.21 1.25* 0.98 0.89  CALCIUM 8.2* 8.3* 7.9* 7.8* 7.8* 7.6*  MG 1.8 2.0 2.0  --   --   --    Liver Function Tests:  Recent Labs Lab 03/19/16 0908  AST 20  ALT 17  ALKPHOS 95  BILITOT 1.8*  PROT 5.6*  ALBUMIN 2.4*   No results for input(s): LIPASE, AMYLASE in the last 168 hours. No results for input(s): AMMONIA in the last 168 hours. Coagulation Profile:  Recent Labs Lab 03/19/16 0908  INR 1.30   CBC:  Recent Labs Lab 03/14/16 0431 03/16/16 0352 03/17/16 0522 03/19/16 0908  WBC 3.7* 9.3 8.6 9.8  HGB 11.4* 12.1* 12.2* 11.8*  HCT 32.9* 34.7* 35.3* 33.6*  MCV 99.7 98.0 98.1 98.5  PLT 171 205 206 209   Cardiac Enzymes: No results for input(s): CKTOTAL, CKMB, CKMBINDEX, TROPONINI in the last 168 hours. BNP: Invalid input(s): POCBNP CBG: No results for input(s): GLUCAP in the last 168 hours. HbA1C: No results for input(s): HGBA1C in the last 72 hours. Urine analysis:    Component Value Date/Time   COLORURINE AMBER* 03/10/2016 1920   APPEARANCEUR CLEAR 03/10/2016 1920   LABSPEC 1.025 03/10/2016 1920   PHURINE 5.5 03/10/2016 1920   GLUCOSEU NEGATIVE 03/10/2016 1920   HGBUR NEGATIVE 03/10/2016 1920   BILIRUBINUR NEGATIVE 03/10/2016 1920   KETONESUR NEGATIVE 03/10/2016 1920   PROTEINUR NEGATIVE 03/10/2016 1920   NITRITE NEGATIVE 03/10/2016 1920   LEUKOCYTESUR NEGATIVE 03/10/2016 1920   Sepsis Labs: @LABRCNTIP (procalcitonin:4,lacticidven:4) ) Recent Results (from the past 240 hour(s))  Surgical pcr screen     Status:  None   Collection Time: 03/11/16  7:23 AM  Result Value Ref Range Status   MRSA, PCR NEGATIVE NEGATIVE Final   Staphylococcus aureus NEGATIVE NEGATIVE Final    Comment:        The Xpert SA Assay (FDA approved for NASAL specimens in patients over 43 years of age), is one component of a comprehensive surveillance program.  Test performance has been validated by [facility] for patients greater than or equal to 5 year old. It is not intended to diagnose infection nor to guide or monitor treatment.   Gram stain     Status: None   Collection Time: 03/20/16 11:52 AM  Result Value Ref Range Status   Specimen Description FLUID ASCITIC  Final   Special Requests NONE  Final   Gram Stain   Final    FEW WBC PRESENT, PREDOMINANTLY MONONUCLEAR NO ORGANISMS SEEN Performed at [facility]    Report Status 03/20/2016 FINAL  Final     Scheduled Meds: . enoxaparin (LOVENOX) injection  40 mg Subcutaneous Q24H  . furosemide  40 mg Oral BID  . pantoprazole  40 mg Oral QHS  . sodium chloride flush  3 mL Intravenous Q12H  . spironolactone  100 mg Oral BID   Continuous Infusions: . sodium chloride 75 mL/hr (03/20/16 1418)    Procedures/Studies: Ct Abdomen Pelvis Wo Contrast  03/10/2016  CLINICAL DATA:  Abdominal pain, onset  today. Nausea and vomiting. Ventral hernia, question incarcerated. EXAM: CT ABDOMEN AND PELVIS WITHOUT CONTRAST TECHNIQUE: Multidetector CT imaging of the abdomen and pelvis was performed following the standard protocol without IV contrast. COMPARISON:  Most recent CT 04/13/2015 FINDINGS: Lower chest: The included lung bases are clear. No pleural effusion. Normal heart size. Liver: Nodular contours consistent with cirrhosis. No evidence of focal lesion allowing for lack contrast. Hepatobiliary: Gallbladder physiologically distended, no calcified stone. No biliary dilatation. Pancreas: No ductal dilatation.  No peripancreatic inflammation. Spleen: Upper normal in size.   Scattered granuloma. Adrenal glands: No nodule. Kidneys: No hydronephrosis or urolithiasis. Kidneys symmetric in size. No perinephric stranding. Stomach/Bowel: Stomach distended with ingested contrast. Small amount contrast in the distal esophagus. Dilated fluid-filled small bowel loops with transition point umbilical hernia. Exiting and distal small bowel loops are decompressed. No pneumatosis. Small volume stool throughout the colon. Distal colonic diverticulosis, no evidence of diverticulitis. Appendix is normal. Vascular/Lymphatic: No retroperitoneal adenopathy. Abdominal aorta is normal in caliber. Moderate atherosclerosis without aneurysm. Reproductive: Prostate gland normal in size. Bladder: Physiologically distended. Other: Moderate volume intra-abdominal and pelvic ascites. Minimal ascites in the umbilical hernia. No free air. No definite loculated fluid collection. Musculoskeletal: There are no acute or suspicious osseous abnormalities. IMPRESSION: 1. Small-bowel obstruction secondary to incarcerated umbilical hernia. 2. Cirrhosis with moderate volume intra-abdominal pelvic ascites. Electronically Signed   By: Jeb Levering M.D.   On: 03/10/2016 18:09   Ct Abdomen Pelvis W Contrast  03/19/2016  CLINICAL DATA:  Persistent abdominal pain and obstruction on recent plain film EXAM: CT ABDOMEN AND PELVIS WITH CONTRAST TECHNIQUE: Multidetector CT imaging of the abdomen and pelvis was performed using the standard protocol following bolus administration of intravenous contrast. CONTRAST:  138mL ISOVUE-300 IOPAMIDOL (ISOVUE-300) INJECTION 61% COMPARISON:  03/19/2016, 03/10/2016 FINDINGS: Lung bases are free of acute infiltrate or sizable effusion. Cirrhotic change of the liver is again identified with significant ascites similar to that seen on the prior study. The spleen, adrenal glands and pancreas are normal in their CT appearance. The gallbladder and kidneys are within normal limits. Continued proximal  small bowel dilatation is identified. There is a relative transition zone in the mid abdomen on image number 47 of series 2. No definitive mass or incarceration is noted associated with these changes. Postoperative change anteriorly is seen consistent with reduction of the previously seen hernia. No incarcerated bowel loops are seen although some mild fluid remains. An anastomosis within the distal small bowel is noted consistent with the given clinical history. This anastomosis appears patent. The appendix is within normal limits. Mild aortoiliac calcifications are seen. The bladder is decompressed. No acute bony abnormality is noted. IMPRESSION: Changes of cirrhosis and moderate ascites similar to that seen on prior CT examination. Proximal small bowel dilatation to approximately the mid to distal jejunum. A relative transition zone is noted in the mid abdomen just below the surgical site although no true mass or incarceration is identified to correspond with these changes. Electronically Signed   By: Inez Catalina M.D.   On: 03/19/2016 13:11   US Paracentesis  03/20/2016  INDICATION: 55 year old male who was admitted secondary to an incarcerated umbilical hernia with ischemic bowel, status post resection. He has known cirrhosis and has developed ascites. A request has been made today for diagnostic and therapeutic paracentesis. EXAM: ULTRASOUND GUIDED DIAGNOSTIC AND THERAPEUTIC PARACENTESIS MEDICATIONS: 1% lidocaine COMPLICATIONS: None immediate. PROCEDURE: Informed written consent was obtained from the patient after a discussion of the risks, benefits and alternatives  to treatment. A timeout was performed prior to the initiation of the procedure. Initial ultrasound scanning demonstrates a large amount of ascites within the right lower abdominal quadrant. The right lower abdomen was prepped and draped in the usual sterile fashion. 1% lidocaine was used for local anesthesia. Following this, a 19 gauge, 7-cm, Yueh  catheter was introduced. An ultrasound image was saved for documentation purposes. The paracentesis was performed. The catheter was removed and a dressing was applied. The patient tolerated the procedure well without immediate post procedural complication. FINDINGS: A total of approximately 5.2 L of clear amber fluid was removed. Samples were sent to the laboratory as requested by the clinical team. IMPRESSION: Successful ultrasound-guided paracentesis yielding 5.2 liters of peritoneal fluid. Read by: Saverio Danker, PA-C Electronically Signed   By: Aletta Edouard M.D.   On: 03/20/2016 13:41   Dg Abd 2 Views  03/19/2016  CLINICAL DATA:  Abdominal distension, umbilical hernia repair 8 days ago. EXAM: ABDOMEN - 2 VIEW COMPARISON:  03/17/2016. FINDINGS: Persistent gaseous distention of small bowel with associated air-fluid levels. Minimal stool and gas in the rectosigmoid colon. IMPRESSION: Bowel gas pattern is indicative of a small bowel obstruction. Electronically Signed   By: Lorin Picket M.D.   On: 03/19/2016 08:38   Dg Abd 2 Views  03/17/2016  CLINICAL DATA:  Patient with abdominal distension. Follow-up small bowel obstruction. EXAM: ABDOMEN - 2 VIEW COMPARISON:  03/16/2016. FINDINGS: Re- demonstrated multiple gaseous distended loops of small bowel within the central abdomen with decompressed small bowel within the right lower quadrant. Decubitus views demonstrate no evidence for free intraperitoneal air. Overall the small bowel loops are stable to slightly more gaseous distended than on prior exam. IMPRESSION: Stable to slight interval increase in gaseous distention of multiple small bowel loops within the central abdomen. No evidence for free intraperitoneal air. Electronically Signed   By: Lovey Newcomer M.D.   On: 03/17/2016 09:00   Dg Abd 2 Views  03/16/2016  CLINICAL DATA:  Nausea, vomiting, abdominal pain and loose stools. EXAM: ABDOMEN - 2 VIEW COMPARISON:  CT abdomen pelvis 03/10/2016. FINDINGS:  There is gaseous distension of small bowel with associated air-fluid levels. Minimal stool in the colon with small amount of gas and stool in the rectum. Lung bases are clear. IMPRESSION: Bowel gas pattern is indicative of a small bowel obstruction. Electronically Signed   By: Lorin Picket M.D.   On: 03/16/2016 10:06    Daniyal Tabor, DO  [facility] Pager [phone]  If 7PM-7AM, please contact night-coverage [email or web address] Password [email or web address] 03/20/2016, 10:01 PM   LOS: 10 days

## 2016-03-20 NOTE — Telephone Encounter (Signed)
PATIENT CALLED AND STATED THAT HE IS IN THE HOSPITAL WITH HERNIA ISSUES.  WAS UNSURE IF HE NEEDED TO LET us KNOW.  I LET HIM KNOW TO LET HIS NURSE ON THE FLOOR KNOW IF HE HAD ANY ISSUES.

## 2016-03-21 DIAGNOSIS — Z720 Tobacco use: Secondary | ICD-10-CM

## 2016-03-21 DIAGNOSIS — I8500 Esophageal varices without bleeding: Secondary | ICD-10-CM

## 2016-03-21 DIAGNOSIS — I959 Hypotension, unspecified: Secondary | ICD-10-CM

## 2016-03-21 LAB — BASIC METABOLIC PANEL
ANION GAP: 10 (ref 5–15)
BUN: 11 mg/dL (ref 6–20)
CALCIUM: 7.5 mg/dL — AB (ref 8.9–10.3)
CO2: 22 mmol/L (ref 22–32)
CREATININE: 0.58 mg/dL — AB (ref 0.61–1.24)
Chloride: 103 mmol/L (ref 101–111)
Glucose, Bld: 77 mg/dL (ref 65–99)
Potassium: 3.5 mmol/L (ref 3.5–5.1)
SODIUM: 135 mmol/L (ref 135–145)

## 2016-03-21 LAB — PATHOLOGIST SMEAR REVIEW

## 2016-03-21 NOTE — Discharge Summary (Signed)
Physician Discharge Summary  Kevin Nicholson [id number] DOB: [birth year] DOA: 03/10/2016  PCP: Minerva Ends, MD  Admit date: 03/10/2016 Discharge date: 03/21/2016  Time spent: 60 minutes  Recommendations for Outpatient Follow-up:  1. Follow electrolytes and weights on diuretics  Discharge Condition: stable    Discharge Diagnoses:  Principal Problem:   SBO (small bowel obstruction) (HCC) Active Problems:   Current smoker   Esophageal varices determined by endoscopy ([place])   Alcoholic cirrhosis of liver with ascites ([place])   Incarcerated umbilical hernia   Malnutrition of moderate degree   Hypotension- SBP in 90s is baselin   History of present illness:   55 y.o. male with medical history significant of cirrhosis of the liver with ascites and grade 1 esophageal varicies.Cirrhosis felt to be EtOH etiology (not drinking now). He has monthly paracentesis for ascites. Patient presented to the ED with c/o N/V, abdominal pain, loose stool that started [id number] pm.  Umbilical hernia was found to be painful, hard, non-reducible. CT scan confirmed obstruction due to incarcerated umbilical hernia [id number]: NGT placed, [facility]CS consulted 4/23: diagnostic laparoscopy, open HERNIA REPAIR UMBILICAL, small bowel resection. Postoperatively, the patient had a postoperative ileus which required NG tube decompression. The patient accidentally sneezed out the NG tube on 03/13/2016. Fortunately, it appeared that his ileus has resolved. Unfortunately, the patient's abdomen became more distended again after his diet was advanced.  03/19/2016 repeat CT abdomen and pelvis reveals proximal small bowel dilatation to approximately the mid to distal jejunum with relative transition zone in the mid abdomen; anastomosis was patent. There was no incarceration.  Hospital Course:  Incarcerated umbilical hernia causing SBO-->post op ileus -didn't improve with NPO, NG decompression -s/p open hernia repair and SB  resection 03/11/16  -Patient developed postoperative ileus requiring NG tube decompression--> improved initially -03/15/16--abd more distended with N/V-->back to ice chips -03/17/2016--AXR-unchanged gaseous distention -03/19/16--CT abdomen and pelvis reveals proximal small bowel dilatation to approximately the mid to distal jejunum with relative transition zone in the mid abdomen; anastomosis was patent. There was no incarceration. - had BM yesterday and today- has good bowel sounds and adamant about leaving today- advanced to solid diet  Alcoholic cirrhosis of liver with varices and ascites -MELD score of 14- managed by Dr Barney Drain -Child class B due to moderate ascites and grade 1 varices -required monthly paracentesis, until diuretic dose increased a few months ago - underwent paracentesis of 5.2 L yesterday with Albumin infusion- fluid sent to lab- not consistent with SBP- one anaerobic culture growing gram variable rods - cont diuretics and Nadolol- BP in 0000000 systolic which he appears to be tolerating well  Chronically low BP - BP in 0000000 systolic which is what it was at his last PCP visit in Jan  Tobacco use -suspect undiagnosed COPD -counseled   Moderate malnutrition  Consultants: [facility] Dr.Thomas  Procedures:PROCEDURE:  03/11/16--diagnostic laparoscopy, open HERNIA REPAIR UMBILICAL ADULT, small bowel resection 5/2- paracentesis by IR   Discharge Exam: Filed Weights   03/10/16 2127  Weight: 86.9 kg (191 lb 9.3 oz)   Filed Vitals:   03/21/16 0016 03/21/16 0623  BP: 95/56 98/57  Pulse: 72 73  Temp: 98 F (36.7 C) 98.3 F (36.8 C)  Resp: 17 18    General: AAO x 3, no distress Cardiovascular: RRR, no murmurs  Respiratory: clear to auscultation bilaterally GI: soft, non-tender, + distended, tympanic, bowel sound positive  Discharge Instructions You were cared for by a hospitalist during your hospital stay. If you have any  questions about your discharge medications or  the care you received while you were in the hospital after you are discharged, you can call the unit and asked to speak with the hospitalist on call if the hospitalist that took care of you is not available. Once you are discharged, your primary care physician will handle any further medical issues. Please note that NO REFILLS for any discharge medications will be authorized once you are discharged, as it is imperative that you return to your primary care physician (or establish a relationship with a primary care physician if you do not have one) for your aftercare needs so that they can reassess your need for medications and monitor your lab values.      Discharge Instructions    Diet - low sodium heart healthy    Complete by:  As directed      Increase activity slowly    Complete by:  As directed             Medication List    TAKE these medications        albuterol 108 (90 Base) MCG/ACT inhaler  Commonly known as:  PROVENTIL HFA;VENTOLIN HFA  Inhale 2 puffs into the lungs every 6 (six) hours as needed for wheezing or shortness of breath.     ALKA-SELTZER PLUS COLD & FLU 03-20-09-250 MG Tbef  Generic drug:  Phenyleph-CPM-DM-APAP  Take 1 tablet by mouth once as needed (abdominal pain.).     bismuth subsalicylate [id number] suspension  Commonly known as:  PEPTO BISMOL  Take 30 mLs by mouth once as needed (abdominal pain.).     cetirizine 10 MG tablet  Commonly known as:  ZYRTEC  Take 1 tablet (10 mg total) by mouth daily.     fluticasone 50 MCG/ACT nasal spray  Commonly known as:  FLONASE  Place 2 sprays into both nostrils daily.     furosemide 40 MG tablet  Commonly known as:  LASIX  Take 40 mg BID     lactulose 10 GM/15ML solution  Commonly known as:  CHRONULAC  Take 15 mLs (10 g total) by mouth 2 (two) times daily as needed for mild constipation.     nadolol 20 MG tablet  Commonly known as:  CORGARD  Take 1 tablet (20 mg total) by mouth daily.     omeprazole 20 MG  capsule  Commonly known as:  PRILOSEC  1 po bid 30 minutes before meals for 3 mos then once daily FOREVER     spironolactone 100 MG tablet  Commonly known as:  ALDACTONE  Take 1 tablet (100 mg total) by mouth 2 (two) times daily.       No Known Allergies Follow-up Information    Follow up with [facility] In 1 week.   Why:  Call for follow up appointment with Dr. Adrian Blackwater.   Contact information:   [address] [place] [phone]       The results of significant diagnostics from this hospitalization (including imaging, microbiology, ancillary and laboratory) are listed below for reference.    Significant Diagnostic Studies: Ct Abdomen Pelvis Wo Contrast  03/10/2016  CLINICAL DATA:  Abdominal pain, onset today. Nausea and vomiting. Ventral hernia, question incarcerated. EXAM: CT ABDOMEN AND PELVIS WITHOUT CONTRAST TECHNIQUE: Multidetector CT imaging of the abdomen and pelvis was performed following the standard protocol without IV contrast. COMPARISON:  Most recent CT 04/13/2015 FINDINGS: Lower chest: The included lung bases are  clear. No pleural effusion. Normal heart size. Liver: Nodular contours consistent with cirrhosis. No evidence of focal lesion allowing for lack contrast. Hepatobiliary: Gallbladder physiologically distended, no calcified stone. No biliary dilatation. Pancreas: No ductal dilatation.  No peripancreatic inflammation. Spleen: Upper normal in size.  Scattered granuloma. Adrenal glands: No nodule. Kidneys: No hydronephrosis or urolithiasis. Kidneys symmetric in size. No perinephric stranding. Stomach/Bowel: Stomach distended with ingested contrast. Small amount contrast in the distal esophagus. Dilated fluid-filled small bowel loops with transition point umbilical hernia. Exiting and distal small bowel loops are decompressed. No pneumatosis. Small volume stool throughout the colon. Distal colonic  diverticulosis, no evidence of diverticulitis. Appendix is normal. Vascular/Lymphatic: No retroperitoneal adenopathy. Abdominal aorta is normal in caliber. Moderate atherosclerosis without aneurysm. Reproductive: Prostate gland normal in size. Bladder: Physiologically distended. Other: Moderate volume intra-abdominal and pelvic ascites. Minimal ascites in the umbilical hernia. No free air. No definite loculated fluid collection. Musculoskeletal: There are no acute or suspicious osseous abnormalities. IMPRESSION: 1. Small-bowel obstruction secondary to incarcerated umbilical hernia. 2. Cirrhosis with moderate volume intra-abdominal pelvic ascites. Electronically Signed   By: Jeb Levering M.D.   On: 03/10/2016 18:09   Ct Abdomen Pelvis W Contrast  03/19/2016  CLINICAL DATA:  Persistent abdominal pain and obstruction on recent plain film EXAM: CT ABDOMEN AND PELVIS WITH CONTRAST TECHNIQUE: Multidetector CT imaging of the abdomen and pelvis was performed using the standard protocol following bolus administration of intravenous contrast. CONTRAST:  194mL ISOVUE-300 IOPAMIDOL (ISOVUE-300) INJECTION 61% COMPARISON:  03/19/2016, 03/10/2016 FINDINGS: Lung bases are free of acute infiltrate or sizable effusion. Cirrhotic change of the liver is again identified with significant ascites similar to that seen on the prior study. The spleen, adrenal glands and pancreas are normal in their CT appearance. The gallbladder and kidneys are within normal limits. Continued proximal small bowel dilatation is identified. There is a relative transition zone in the mid abdomen on image number 47 of series 2. No definitive mass or incarceration is noted associated with these changes. Postoperative change anteriorly is seen consistent with reduction of the previously seen hernia. No incarcerated bowel loops are seen although some mild fluid remains. An anastomosis within the distal small bowel is noted consistent with the given clinical  history. This anastomosis appears patent. The appendix is within normal limits. Mild aortoiliac calcifications are seen. The bladder is decompressed. No acute bony abnormality is noted. IMPRESSION: Changes of cirrhosis and moderate ascites similar to that seen on prior CT examination. Proximal small bowel dilatation to approximately the mid to distal jejunum. A relative transition zone is noted in the mid abdomen just below the surgical site although no true mass or incarceration is identified to correspond with these changes. Electronically Signed   By: Inez Catalina M.D.   On: 03/19/2016 13:11   US Paracentesis  03/20/2016  INDICATION: 55 year old male who was admitted secondary to an incarcerated umbilical hernia with ischemic bowel, status post resection. He has known cirrhosis and has developed ascites. A request has been made today for diagnostic and therapeutic paracentesis. EXAM: ULTRASOUND GUIDED DIAGNOSTIC AND THERAPEUTIC PARACENTESIS MEDICATIONS: 1% lidocaine COMPLICATIONS: None immediate. PROCEDURE: Informed written consent was obtained from the patient after a discussion of the risks, benefits and alternatives to treatment. A timeout was performed prior to the initiation of the procedure. Initial ultrasound scanning demonstrates a large amount of ascites within the right lower abdominal quadrant. The right lower abdomen was prepped and draped in the usual sterile fashion. 1% lidocaine was used for local  anesthesia. Following this, a 19 gauge, 7-cm, Yueh catheter was introduced. An ultrasound image was saved for documentation purposes. The paracentesis was performed. The catheter was removed and a dressing was applied. The patient tolerated the procedure well without immediate post procedural complication. FINDINGS: A total of approximately 5.2 L of clear amber fluid was removed. Samples were sent to the laboratory as requested by the clinical team. IMPRESSION: Successful ultrasound-guided paracentesis  yielding 5.2 liters of peritoneal fluid. Read by: Saverio Danker, PA-C Electronically Signed   By: Aletta Edouard M.D.   On: 03/20/2016 13:41   Dg Abd 2 Views  03/19/2016  CLINICAL DATA:  Abdominal distension, umbilical hernia repair 8 days ago. EXAM: ABDOMEN - 2 VIEW COMPARISON:  03/17/2016. FINDINGS: Persistent gaseous distention of small bowel with associated air-fluid levels. Minimal stool and gas in the rectosigmoid colon. IMPRESSION: Bowel gas pattern is indicative of a small bowel obstruction. Electronically Signed   By: Lorin Picket M.D.   On: 03/19/2016 08:38   Dg Abd 2 Views  03/17/2016  CLINICAL DATA:  Patient with abdominal distension. Follow-up small bowel obstruction. EXAM: ABDOMEN - 2 VIEW COMPARISON:  03/16/2016. FINDINGS: Re- demonstrated multiple gaseous distended loops of small bowel within the central abdomen with decompressed small bowel within the right lower quadrant. Decubitus views demonstrate no evidence for free intraperitoneal air. Overall the small bowel loops are stable to slightly more gaseous distended than on prior exam. IMPRESSION: Stable to slight interval increase in gaseous distention of multiple small bowel loops within the central abdomen. No evidence for free intraperitoneal air. Electronically Signed   By: Lovey Newcomer M.D.   On: 03/17/2016 09:00   Dg Abd 2 Views  03/16/2016  CLINICAL DATA:  Nausea, vomiting, abdominal pain and loose stools. EXAM: ABDOMEN - 2 VIEW COMPARISON:  CT abdomen pelvis 03/10/2016. FINDINGS: There is gaseous distension of small bowel with associated air-fluid levels. Minimal stool in the colon with small amount of gas and stool in the rectum. Lung bases are clear. IMPRESSION: Bowel gas pattern is indicative of a small bowel obstruction. Electronically Signed   By: Lorin Picket M.D.   On: 03/16/2016 10:06    Microbiology: Recent Results (from the past 240 hour(s))  Culture, body fluid-bottle     Status: None (Preliminary result)    Collection Time: 03/20/16 11:52 AM  Result Value Ref Range Status   Specimen Description FLUID ASCITIC  Final   Special Requests NONE  Final   Gram Stain   Final    GRAM VARIABLE ROD ANAEROBIC BOTTLE ONLY CRITICAL RESULT CALLED TO, READ BACK BY AND VERIFIED WITH: R RIMANDO,RN @0655  03/21/16 MKELLY Performed at [facility]    Culture PENDING  Incomplete   Report Status PENDING  Incomplete  Gram stain     Status: None   Collection Time: 03/20/16 11:52 AM  Result Value Ref Range Status   Specimen Description FLUID ASCITIC  Final   Special Requests NONE  Final   Gram Stain   Final    FEW WBC PRESENT, PREDOMINANTLY MONONUCLEAR NO ORGANISMS SEEN Performed at [facility]    Report Status 03/20/2016 FINAL  Final     Labs: Basic Metabolic Panel:  Recent Labs Lab 03/15/16 0413 03/16/16 0352 03/17/16 0522 03/18/16 0414 03/19/16 0413 03/19/16 0908 03/21/16 0453  NA 134* 138 136 138 140 138 135  K 3.7 3.6 4.3 4.1 4.1 3.8 3.5  CL 99* 96* 98* 101 105 104 103  CO2 24 29 29  27  25 25 22   GLUCOSE 126* 128* 111* 95 90 94 77  BUN 27* 34* 38* 42* 31* 25* 11  CREATININE 0.94 1.21 1.21 1.25* 0.98 0.89 0.58*  CALCIUM 8.2* 8.3* 7.9* 7.8* 7.8* 7.6* 7.5*  MG 1.8 2.0 2.0  --   --   --   --    Liver Function Tests:  Recent Labs Lab 03/19/16 0908  AST 20  ALT 17  ALKPHOS 95  BILITOT 1.8*  PROT 5.6*  ALBUMIN 2.4*   No results for input(s): LIPASE, AMYLASE in the last 168 hours. No results for input(s): AMMONIA in the last 168 hours. CBC:  Recent Labs Lab 03/16/16 0352 03/17/16 0522 03/19/16 0908  WBC 9.3 8.6 9.8  HGB 12.1* 12.2* 11.8*  HCT 34.7* 35.3* 33.6*  MCV 98.0 98.1 98.5  PLT 205 206 209   Cardiac Enzymes: No results for input(s): CKTOTAL, CKMB, CKMBINDEX, TROPONINI in the last 168 hours. BNP: BNP (last 3 results)  Recent Labs  04/13/15 1320 06/21/15 1045  BNP 50.1 181.4*    ProBNP (last 3 results) No results for input(s): PROBNP in the  last 8760 hours.  CBG: No results for input(s): GLUCAP in the last 168 hours.     SignedDebbe Odea, MD [facility] 03/21/2016, 10:54 AM

## 2016-03-21 NOTE — Progress Notes (Signed)
Patent alert and oriented with pain controlled. Patient has appointment schedule with PCP on May 19 th at 44 am. Patient aware of appointment and encouraged to make follow up appointment with surgeon. Patient given discharge instructions and verbalized understanding. All questions answered.

## 2016-03-21 NOTE — Progress Notes (Signed)
[facility] Progress Note  10 Days Post-Op  Subjective: Pt has his clothes on and he's ready to be discharged.  No N/V, tolerating diet well.  Ambulating well.  Had a good BM yesterday, feels much better after paracentesis.  Having great flatus.  Objective: Vital signs in last 24 hours: Temp:  [97.9 F (36.6 C)-98.3 F (36.8 C)] 98.3 F (36.8 C) (05/03 0623) Pulse Rate:  [71-83] 73 (05/03 0623) Resp:  [16-18] 18 (05/03 0623) BP: (90-125)/(44-70) 98/57 mmHg (05/03 0623) SpO2:  [98 %-100 %] 99 % (05/03 0623) Last BM Date: 03/20/16  Intake/Output from previous day: 05/02 0701 - 05/03 0700 In: 1983 [P.O.:180; I.V.:1803] Out: 1650 [Urine:1650] Intake/Output this shift:    PE: Gen:  Alert, NAD, pleasant Abd: Soft, mild distension, less ascites than yesterday, NT, +BS, no HSM, incisions C/D/I (no staples)   Lab Results:   Recent Labs  03/19/16 0908  WBC 9.8  HGB 11.8*  HCT 33.6*  PLT 209   BMET  Recent Labs  03/19/16 0908 03/21/16 0453  NA 138 135  K 3.8 3.5  CL 104 103  CO2 25 22  GLUCOSE 94 77  BUN 25* 11  CREATININE 0.89 0.58*  CALCIUM 7.6* 7.5*   PT/INR  Recent Labs  03/19/16 0908  LABPROT 16.3*  INR 1.30   CMP     Component Value Date/Time   NA 135 03/21/2016 0453   K 3.5 03/21/2016 0453   CL 103 03/21/2016 0453   CO2 22 03/21/2016 0453   GLUCOSE 77 03/21/2016 0453   BUN 11 03/21/2016 0453   CREATININE 0.58* 03/21/2016 0453   CREATININE 1.00 01/30/2016 1547   CALCIUM 7.5* 03/21/2016 0453   PROT 5.6* 03/19/2016 0908   ALBUMIN 2.4* 03/19/2016 0908   AST 20 03/19/2016 0908   ALT 17 03/19/2016 0908   ALKPHOS 95 03/19/2016 0908   BILITOT 1.8* 03/19/2016 0908   GFRNONAA >60 03/21/2016 0453   GFRNONAA 84 01/30/2016 1547   GFRAA >60 03/21/2016 0453   GFRAA >89 01/30/2016 1547   Lipase     Component Value Date/Time   LIPASE 38 03/10/2016 1553       Studies/Results: Ct Abdomen Pelvis W Contrast  03/19/2016  CLINICAL  DATA:  Persistent abdominal pain and obstruction on recent plain film EXAM: CT ABDOMEN AND PELVIS WITH CONTRAST TECHNIQUE: Multidetector CT imaging of the abdomen and pelvis was performed using the standard protocol following bolus administration of intravenous contrast. CONTRAST:  191mL ISOVUE-300 IOPAMIDOL (ISOVUE-300) INJECTION 61% COMPARISON:  03/19/2016, 03/10/2016 FINDINGS: Lung bases are free of acute infiltrate or sizable effusion. Cirrhotic change of the liver is again identified with significant ascites similar to that seen on the prior study. The spleen, adrenal glands and pancreas are normal in their CT appearance. The gallbladder and kidneys are within normal limits. Continued proximal small bowel dilatation is identified. There is a relative transition zone in the mid abdomen on image number 47 of series 2. No definitive mass or incarceration is noted associated with these changes. Postoperative change anteriorly is seen consistent with reduction of the previously seen hernia. No incarcerated bowel loops are seen although some mild fluid remains. An anastomosis within the distal small bowel is noted consistent with the given clinical history. This anastomosis appears patent. The appendix is within normal limits. Mild aortoiliac calcifications are seen. The bladder is decompressed. No acute bony abnormality is noted. IMPRESSION: Changes of cirrhosis and moderate ascites similar to that seen on prior CT examination. Proximal  small bowel dilatation to approximately the mid to distal jejunum. A relative transition zone is noted in the mid abdomen just below the surgical site although no true mass or incarceration is identified to correspond with these changes. Electronically Signed   By: Inez Catalina M.D.   On: 03/19/2016 13:11   US Paracentesis  03/20/2016  INDICATION: 55 year old male who was admitted secondary to an incarcerated umbilical hernia with ischemic bowel, status post resection. He has known  cirrhosis and has developed ascites. A request has been made today for diagnostic and therapeutic paracentesis. EXAM: ULTRASOUND GUIDED DIAGNOSTIC AND THERAPEUTIC PARACENTESIS MEDICATIONS: 1% lidocaine COMPLICATIONS: None immediate. PROCEDURE: Informed written consent was obtained from the patient after a discussion of the risks, benefits and alternatives to treatment. A timeout was performed prior to the initiation of the procedure. Initial ultrasound scanning demonstrates a large amount of ascites within the right lower abdominal quadrant. The right lower abdomen was prepped and draped in the usual sterile fashion. 1% lidocaine was used for local anesthesia. Following this, a 19 gauge, 7-cm, Yueh catheter was introduced. An ultrasound image was saved for documentation purposes. The paracentesis was performed. The catheter was removed and a dressing was applied. The patient tolerated the procedure well without immediate post procedural complication. FINDINGS: A total of approximately 5.2 L of clear amber fluid was removed. Samples were sent to the laboratory as requested by the clinical team. IMPRESSION: Successful ultrasound-guided paracentesis yielding 5.2 liters of peritoneal fluid. Read by: Saverio Danker, PA-C Electronically Signed   By: Aletta Edouard M.D.   On: 03/20/2016 13:41    Anti-infectives: Anti-infectives    Start     Dose/Rate Route Frequency Ordered Stop   03/11/16 1015  cefOXitin (MEFOXIN) 2 g in dextrose 5 % 50 mL IVPB     2 g 100 mL/hr over 30 Minutes Intravenous  Once 03/11/16 1011 03/11/16 0915       Assessment/Plan Strangulated umbilical hernia [id number] open umbilical hernia repair, SBR---Dr. Marcello Moores Post op ileus -Ileus resolving -Having BM's and lots of flatus -Bili 1.8, ALT/AST normal, Alkphos normal, INR/APTT normal -Mobilize, IS  -No staples to be removed FEN-IVF VTE prophylaxis-lovenox, SCDs Cirrhosis of liver/Etoh abuse - paracentesis went well and fluid was clear  not concerning for infection Dispo-d/c home today, stick to soft/puree diet for a few weeks    LOS: 11 days    Nat Christen 03/21/2016, 10:34 AM Pager: [phone]  (7am - 4:30pm M-F; 7am - 11:30am Sa/Su)

## 2016-03-21 NOTE — Discharge Instructions (Signed)
[facility]      [facility] [phone]  OPEN ABDOMINAL SURGERY: POST OP INSTRUCTIONS  Always review your discharge instruction sheet given to you by the facility where your surgery was performed.  IF YOU HAVE DISABILITY OR FAMILY LEAVE FORMS, YOU MUST BRING THEM TO THE OFFICE FOR PROCESSING.  PLEASE DO NOT GIVE THEM TO YOUR DOCTOR.  1. A prescription for pain medication may be given to you upon discharge.  Take your pain medication as prescribed, if needed.  If narcotic pain medicine is not needed, then you may take acetaminophen (Tylenol) or ibuprofen (Advil) as needed. 2. Take your usually prescribed medications unless otherwise directed. 3. If you need a refill on your pain medication, please contact your pharmacy. They will contact our office to request authorization.  Prescriptions will not be filled after 5pm or on week-ends. 4. You should follow a light diet the first few days after arrival home, such as soup and crackers, pudding, etc.unless your doctor has advised otherwise. A high-fiber, low fat diet can be resumed as tolerated.   Be sure to include lots of fluids daily. Most patients will experience some swelling and bruising on the chest and neck area.  Ice packs will help.  Swelling and bruising can take several days to resolve 5. Most patients will experience some swelling and bruising in the area of the incision. Ice pack will help. Swelling and bruising can take several days to resolve..  6. It is common to experience some constipation if taking pain medication after surgery.  Increasing fluid intake and taking a stool softener will usually help or prevent this problem from occurring.  A mild laxative (Milk of Magnesia or Miralax) should be taken according to package directions if there are no bowel movements after 48 hours. 7.  You may have steri-strips (small skin tapes) in place directly over the incision.  These strips should be left on the skin for 7-10 days.  If your  surgeon used skin glue on the incision, you may shower in 24 hours.  The glue will flake off over the next 2-3 weeks.  Any sutures or staples will be removed at the office during your follow-up visit. You may find that a light gauze bandage over your incision may keep your staples from being rubbed or pulled. You may shower and replace the bandage daily. 8. ACTIVITIES:  You may resume regular (light) daily activities beginning the next day--such as daily self-care, walking, climbing stairs--gradually increasing activities as tolerated.  You may have sexual intercourse when it is comfortable.  Refrain from any heavy lifting or straining until approved by your doctor. a. You may drive when you no longer are taking prescription pain medication, you can comfortably wear a seatbelt, and you can safely maneuver your car and apply brakes b. Return to Work: ___________________________________ 9. You should see your doctor in the office for a follow-up appointment approximately two weeks after your surgery.  Make sure that you call for this appointment within a day or two after you arrive home to insure a convenient appointment time. OTHER INSTRUCTIONS:  _____________________________________________________________ _____________________________________________________________  WHEN TO CALL YOUR DOCTOR: 1. Fever over 101.0 2. Inability to urinate 3. Nausea and/or vomiting 4. Extreme swelling or bruising 5. Continued bleeding from incision. 6. Increased pain, redness, or drainage from the incision. 7. Difficulty swallowing or breathing 8. Muscle cramping or spasms. 9. Numbness or tingling in hands or feet or around lips.  The clinic staff is available to   answer your questions during regular business hours.  Please don't hesitate to call and ask to speak to one of the nurses if you have concerns.  For further questions, please visit [email or web address]   

## 2016-03-21 NOTE — Progress Notes (Signed)
Microl[facility] called for a Positive (+) Body Fluid Culture  Gram Variable Rod ( un aerob bottle)

## 2016-03-22 ENCOUNTER — Telehealth: Payer: Self-pay | Admitting: Family Medicine

## 2016-03-22 NOTE — Telephone Encounter (Signed)
Forwarding FYI to Dr. Oneida Alar and Laban Emperor, NP.

## 2016-03-22 NOTE — Telephone Encounter (Signed)
Called patient to check in following his recent hospitalization Requested a call back

## 2016-03-22 NOTE — Telephone Encounter (Signed)
REVIEWED-NO ADDITIONAL RECOMMENDATIONS. 

## 2016-03-23 NOTE — Telephone Encounter (Signed)
Noted  

## 2016-03-24 LAB — CULTURE, BODY FLUID W GRAM STAIN -BOTTLE

## 2016-03-24 LAB — CULTURE, BODY FLUID-BOTTLE

## 2016-03-26 ENCOUNTER — Telehealth: Payer: Self-pay | Admitting: Family Medicine

## 2016-03-26 NOTE — Telephone Encounter (Signed)
Pt will stop by on Wednesday with his medication to reviewed with pt

## 2016-03-26 NOTE — Telephone Encounter (Signed)
Patient would like to  Come in and see PCP as soon as possible he states he is unaware of what medications he should be taking.  I informed patient PCP  Did not have any availability for this week  Patient became frustrated and stated "you people just don't understand anything".....  I apologized and informed patient I would put a note in the system and route it to nurse to advised patient of current medication he should be taking

## 2016-03-26 NOTE — Progress Notes (Signed)
Quick Note:  HI Doris! We need the lab from [place]. I still haven't been able to see these results. ______

## 2016-03-27 NOTE — Progress Notes (Signed)
Quick Note:  Kevin Nicholson, please look under labs for the results, signed off by Dr. Adrian Blackwater on 03/01/2016. ______

## 2016-03-27 NOTE — Progress Notes (Signed)
Quick Note:  Correction, lab on 03/01/2016 and signed off on 03/06/2016. ______

## 2016-03-28 ENCOUNTER — Telehealth: Payer: Self-pay | Admitting: *Deleted

## 2016-03-28 MED FILL — SPIRONOLACTONE 100 MG TAB: 100 | 30 days supply | Qty: 60 | Fill #1

## 2016-03-28 NOTE — Telephone Encounter (Signed)
Pt here for medication management  Review medication with pt  Pt verbalized understanding

## 2016-04-06 ENCOUNTER — Encounter: Payer: Self-pay | Admitting: Family Medicine

## 2016-04-06 ENCOUNTER — Ambulatory Visit (HOSPITAL_BASED_OUTPATIENT_CLINIC_OR_DEPARTMENT_OTHER): Payer: Medicaid Other | Admitting: Family Medicine

## 2016-04-06 ENCOUNTER — Ambulatory Visit (HOSPITAL_COMMUNITY)
Admission: RE | Admit: 2016-04-06 | Discharge: 2016-04-06 | Disposition: A | Discharge status: Home / Self Care | Payer: Medicaid Other | Source: Ambulatory Visit | Attending: Family Medicine | Admitting: Family Medicine

## 2016-04-06 ENCOUNTER — Telehealth: Payer: Self-pay | Admitting: Family Medicine

## 2016-04-06 VITALS — BP 87/60 | HR 84 | Temp 98.7°F | Resp 16 | Ht 71.0 in | Wt 177.0 lb

## 2016-04-06 DIAGNOSIS — K420 Umbilical hernia with obstruction, without gangrene: Secondary | ICD-10-CM

## 2016-04-06 DIAGNOSIS — K7031 Alcoholic cirrhosis of liver with ascites: Secondary | ICD-10-CM

## 2016-04-06 DIAGNOSIS — K5669 Other intestinal obstruction: Secondary | ICD-10-CM

## 2016-04-06 DIAGNOSIS — K56609 Unspecified intestinal obstruction, unspecified as to partial versus complete obstruction: Secondary | ICD-10-CM

## 2016-04-06 DIAGNOSIS — R1031 Right lower quadrant pain: Secondary | ICD-10-CM | POA: Diagnosis not present

## 2016-04-06 DIAGNOSIS — R1032 Left lower quadrant pain: Secondary | ICD-10-CM

## 2016-04-06 LAB — COMPLETE METABOLIC PANEL WITH GFR
ALBUMIN: 3.2 g/dL — AB (ref 3.6–5.1)
ALK PHOS: 140 U/L — AB (ref 40–115)
ALT: 20 U/L (ref 9–46)
AST: 27 U/L (ref 10–35)
BUN: 27 mg/dL — AB (ref 7–25)
CALCIUM: 8 mg/dL — AB (ref 8.6–10.3)
CO2: 26 mmol/L (ref 20–31)
CREATININE: 1.26 mg/dL (ref 0.70–1.33)
Chloride: 96 mmol/L — ABNORMAL LOW (ref 98–110)
GFR, Est African American: 74 mL/min (ref >=60)
GFR, Est Non African American: 64 mL/min (ref >=60)
Glucose, Bld: 119 mg/dL — ABNORMAL HIGH (ref 65–99)
Potassium: 4.6 mmol/L (ref 3.5–5.3)
Sodium: 131 mmol/L — ABNORMAL LOW (ref 135–146)
TOTAL PROTEIN: 6.4 g/dL (ref 6.1–8.1)
Total Bilirubin: 1.3 mg/dL — ABNORMAL HIGH (ref 0.2–1.2)

## 2016-04-06 LAB — CBC
HEMATOCRIT: 35.4 % — AB (ref 38.5–50.0)
HEMOGLOBIN: 12.4 g/dL — AB (ref 13.2–17.1)
MCH: 34.3 pg — ABNORMAL HIGH (ref 27.0–33.0)
MCHC: 35 g/dL (ref 32.0–36.0)
MCV: 97.8 fL (ref 80.0–100.0)
MPV: 8.6 fL (ref 7.5–12.5)
Platelets: 257 10*3/uL (ref 140–400)
RBC: 3.62 MIL/uL — AB (ref 4.20–5.80)
RDW: 13.9 % (ref 11.0–15.0)
WBC: 10.2 10*3/uL (ref 3.8–10.8)

## 2016-04-06 MED ORDER — TRAMADOL HCL 50 MG PO TABS: 50.0000 mg | ORAL_TABLET | Freq: Three times a day (TID) | ORAL | Status: DC | PRN

## 2016-04-06 MED FILL — NADOLOL 20 MG TABLET: 20 | 30 days supply | Qty: 30 | Fill #5

## 2016-04-06 MED FILL — traMADol HCL 50 MG TABS: 50 | 10 days supply | Qty: 30 | Fill #0

## 2016-04-06 MED FILL — ALL DAY ALLERGY 10 MG TAB: 10 | 30 days supply | Qty: 30 | Fill #7

## 2016-04-06 MED FILL — ?FUROSEMIDE 40 MG TABLET: 40 | 30 days supply | Qty: 60 | Fill #1

## 2016-04-06 NOTE — Patient Instructions (Addendum)
Kevin Nicholson was seen today for hernia.  Diagnoses and all orders for this visit:  SBO (small bowel obstruction) (HCC) -     CBC -     COMPLETE METABOLIC PANEL WITH GFR -     DG Abd 2 Views; Future  Alcoholic cirrhosis of liver with ascites (HCC) -     CBC -     COMPLETE METABOLIC PANEL WITH GFR  Bilateral lower abdominal pain -     traMADol (ULTRAM) 50 MG tablet; Take 1 tablet (50 mg total) by mouth every 8 (eight) hours as needed.   May 23, at 2:30 PM arrive at 2:10 PM to fill out new patient paperwork   [place] office, shares building with [facility]   [address]  (of note [place] is one work)  [place],  [id number]  If you develop fever, nausea or vomiting please return to ED as these are signs of recurrent small bowel obstruction   Go back to a liquid diet, full liquid at this point.  If you do not tolerate full liquid go to clear liquid diet   F/u with me in 2 weeks   Dr. Adrian Blackwater   Full Liquid Diet A full liquid diet may be used:   To help you transition from a clear liquid diet to a soft diet.   When your body is healing and can only tolerate foods that are easy to digest.  Before or after certain a procedure, test, or surgery (such as stomach or intestinal surgeries).   If you have trouble swallowing or chewing.  A full liquid diet includes fluids and foods that are liquid or will become liquid at room temperature. The full liquid diet gives you the proteins, fluids, salts, and minerals that you need for energy. If you continue this diet for more than 72 hours, talk to your health care provider about how many calories you need to consume. If you continue the diet for more than 5 days, talk to your health care provider about taking a multivitamin or a nutritional supplement. WHAT DO I NEED TO KNOW ABOUT A FULL LIQUID DIET?  You may have any liquid.  You may have any food that becomes a liquid at room temperature. The food is considered a liquid  if it can be poured off a spoon at room temperature.  Drink one serving of citrus or vitamin C-enriched fruit juice daily. WHAT FOODS CAN I EAT? Grains Any grain food that can be pureed in soup (such as crackers, pasta, and rice). Hot cereal (such as farina or oatmeal) that has been blended. Talk to your health care provider or dietitian about these foods. Vegetables Pulp-free tomato or vegetable juice. Vegetables pureed in soup.  Fruits Fruit juice, including nectars and juices with pulp. Meats and Other Protein Sources Eggs in custard, eggnog mix, and eggs used in ice cream or pudding. Strained meats, like in baby food, may be allowed. Consult your health care provider.  Dairy Milk and milk-based beverages, including milk shakes and instant breakfast mixes. Smooth yogurt. Pureed cottage cheese. Avoid these foods if they are not well tolerated. Beverages All beverages, including liquid nutritional supplements. Ask your health care provider if you can have carbonated beverages. They may not be well tolerated. Condiments Iodized salt, pepper, spices, and flavorings. Cocoa powder. Vinegar, ketchup, yellow mustard, smooth sauces (such as hollandaise, cheese sauce, or white sauce), and soy sauce. Sweets and Desserts Custard, smooth pudding. Flavored gelatin. Tapioca, junket. Plain ice cream, sherbet,  fruit ices. Frozen ice pops, frozen fudge pops, pudding pops, and other frozen bars with cream. Syrups, including chocolate syrup. Sugar, honey, jelly.  Fats and Oils Margarine, butter, cream, sour cream, and oils. Other Broth and cream soups. Strained, broth-based soups. The items listed above may not be a complete list of recommended foods or beverages. Contact your dietitian for more options.  WHAT FOODS CAN I NOT EAT? Grains All breads. Grains are not allowed unless they are pureed into soup. Vegetables Vegetables are not allowed unless they are juiced, or cooked and pureed into  soup. Fruits Fruits are not allowed unless they are juiced. Meats and Other Protein Sources Any meat or fish. Cooked or raw eggs. Nut butters.  Dairy Cheese.  Condiments Stone ground mustards. Fats and Oils Fats that are coarse or chunky. Sweets and Desserts Ice cream or other frozen desserts that have any solids in them or on top, such as nuts, chocolate chips, and pieces of cookies. Cakes. Cookies. Candy. Others Soups with chunks or pieces in them. The items listed above may not be a complete list of foods and beverages to avoid. Contact your dietitian for more information.   This information is not intended to replace advice given to you by your health care provider. Make sure you discuss any questions you have with your health care provider.   Document Released: 11/05/2005 Document Revised: 11/10/2013 Document Reviewed: 09/10/2013 Elsevier Interactive Patient Education 2016 [place] Liquid Diet A clear liquid diet is a short-term diet. It is made up of liquids or solids that become liquids at room temperature. You should be able to see through the liquid. WHAT CAN I HAVE? You may have any of the following if your doctor says they are okay:  Vegetable juices or fruit juices that do not have pulp.  Coffee.  Tea.  Soda.  Clear broth or strained broth-based soups.  High-protein gelatins and flavored gelatins.  Sugar and honey.  Ices or frozen ice pops that do not have milk. If you are not sure whether you can have a certain liquid, ask your doctor. You may also ask your doctor about other clear liquid options.   This information is not intended to replace advice given to you by your health care provider. Make sure you discuss any questions you have with your health care provider.   Document Released: 10/18/2008 Document Revised: 11/10/2013 Document Reviewed: 10/02/2013 Elsevier Interactive Patient Education [employer].

## 2016-04-06 NOTE — Telephone Encounter (Signed)
Reviewed Patient called  Left VM Left detailed VM as it is after hours, clinic is closed, and this is pertinent information for the patient  Abdominal plain film revealed a partial small bowel obstruction  Patient advised to do liquid diet for partial small bowel obstruction Use tramadol very sparingly for pain   He is advised to keep f/u with gen surg scheduled by me during his OV today And to go to ED if he develops nausea, vomiting, severe abdominal pain, fever or stops passing gas from below

## 2016-04-06 NOTE — Progress Notes (Signed)
Subjective:  Patient ID: Kevin Nicholson, male    DOB: 1961-01-09  Age: 55 y.o. MRN: [id number]  CC: Hernia   HPI TOAN MUSKA has alcoholic cirrhosis with esophageal varices and umbilical hernia presents for   1. Hospital follow up hernia repair and small bowel obstruction: he was hospitalized from 03/10/2016-03/21/2016 after presenting to the ED with N/V/ loose stools and abdominal pain. He was found to have an obstruction due to incarcerated umbilical hernia. He had NGT placement and was taking to the OR on 4/23 for diagnostic laparoscopy, open umbilical hernia repair and small bowel resection. His hospital course was extended due to a post op ileus requiring NG decompression. Repeat CT abdomen and pelivs scan on 03/19/16 revealed proximal small bowel dilatation to approximately the mid to distal jejunum with relative transition zone in the mid abdomen; anastomosis was patent. There was no incarceration. He had two days of BMs and good bowel sounds. He tolerated a solid diet on day of discharge. He reports that it was recommended that he stay another day or two but he was eager to be discharged.   He was doing well tolerating solid foods until 2 days ago when he developed sharp lower abdominal pain. Pain is constant. He believes the pain started after eating spicy shrimp. He denies nausea, vomiting, fever, chills. He reports passing gas normally and normal bowel movements.   Social History  Substance Use Topics  . Smoking status: Current Every Day Smoker -- 1.00 packs/day for 45 years    Types: Cigarettes  . Smokeless tobacco: Not on file  . 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 Back surgery  2000  . 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 Esophagogastroduodenoscopy (egd) with propofol N/A 09/13/2015    Dr. Hoy Register columns grade I varices/3 small ulcers in  the gastric antrum. +H.pylori . Surveillance due in Jan 2017 for ulcer healing  . Biopsy N/A 09/13/2015    Procedure: BIOPSY;  Surgeon: Danie Binder, MD;  Location: [facility];  Service: Endoscopy;  Laterality: N/A;  gastric,   . Esophagogastroduodenoscopy (egd) with propofol N/A 01/03/2016    Procedure: ESOPHAGOGASTRODUODENOSCOPY (EGD) WITH PROPOFOL;  Surgeon: Danie Binder, MD;  Location: [facility];  Service: Endoscopy;  Laterality: N/A;  0845  . Colonoscopy with propofol N/A 01/03/2016    Procedure: COLONOSCOPY WITH PROPOFOL;  Surgeon: Danie Binder, MD;  Location: [facility];  Service: Endoscopy;  Laterality: N/A;  . Polypectomy  01/03/2016    Procedure: POLYPECTOMY;  Surgeon: Danie Binder, MD;  Location: [facility];  Service: Endoscopy;;  transverse colon polyps x2, sigmoid colon polyps x4, rectal polyps x5  . Biopsy  01/03/2016    Procedure: BIOPSY;  Surgeon: Danie Binder, MD;  Location: [facility];  Service: Endoscopy;;  gastric bx's  . Umbilical hernia repair N/A 03/11/2016    Procedure: laparoscopy, open HERNIA REPAIR UMBILICAL ADULT, small bowel resection ;  Surgeon: Leighton Ruff, MD;  Location: [facility];  Service: General;  Laterality: N/A;   Outpatient Prescriptions Prior to Visit  Medication Sig Dispense Refill  . albuterol (PROVENTIL HFA;VENTOLIN HFA) 108 (90 BASE) MCG/ACT inhaler Inhale 2 puffs into the lungs every 6 (six) hours as needed for wheezing or shortness of breath. 1 Inhaler 2  . cetirizine (ZYRTEC)  10 MG tablet Take 1 tablet (10 mg total) by mouth daily. 30 tablet 11  . fluticasone (FLONASE) 50 MCG/ACT nasal spray Place 2 sprays into both nostrils daily. 16 g 6  . furosemide (LASIX) 40 MG tablet Take 40 mg BID 60 tablet 5  . lactulose (CHRONULAC) 10 GM/15ML solution Take 15 mLs (10 g total) by mouth 2 (two) times daily as needed for mild constipation. 946 mL 5  . nadolol (CORGARD) 20 MG tablet Take 1 tablet (20 mg total) by mouth daily. 30 tablet 5  .  omeprazole (PRILOSEC) 20 MG capsule 1 po bid 30 minutes before meals for 3 mos then once daily FOREVER 60 capsule 11  . spironolactone (ALDACTONE) 100 MG tablet Take 1 tablet (100 mg total) by mouth 2 (two) times daily. 60 tablet 3  . bismuth subsalicylate (PEPTO BISMOL) 262 MG/15ML suspension Take 30 mLs by mouth once as needed (abdominal pain.). Reported on 04/06/2016    . Phenyleph-CPM-DM-APAP (ALKA-SELTZER PLUS COLD & FLU) 03-20-09-250 MG TBEF Take 1 tablet by mouth once as needed (abdominal pain.). Reported on 04/06/2016     Facility-Administered Medications Prior to Visit  Medication Dose Route Frequency Provider Last Rate Last Dose  . albumin human 25 % solution 25 g  25 g Intravenous Once Danie Binder, MD      . albumin human 25 % solution 25 g  25 g Intravenous Once Stuart Guillen, MD        ROS Review of Systems  Constitutional: Negative for fever, chills, fatigue and unexpected weight change.  Eyes: Negative for visual disturbance.  Respiratory: Negative for cough and shortness of breath.   Cardiovascular: Negative for chest pain, palpitations and leg swelling.  Gastrointestinal: Positive for abdominal pain. Negative for nausea, vomiting, diarrhea, constipation and blood in stool.  Endocrine: Negative for polydipsia, polyphagia and polyuria.  Musculoskeletal: Negative for myalgias, back pain, arthralgias, gait problem and neck pain.  Skin: Negative for rash.  Allergic/Immunologic: Negative for immunocompromised state.  Hematological: Negative for adenopathy. Does not bruise/bleed easily.  Psychiatric/Behavioral: Negative for suicidal ideas, sleep disturbance and dysphoric mood. The patient is not nervous/anxious.     Objective:  BP 87/60 mmHg  Pulse 84  Temp(Src) 98.7 F (37.1 C) (Oral)  Resp 16  Ht 5\' 11"  (1.803 m)  Wt 177 lb (80.287 kg)  BMI 24.70 kg/m2  SpO2 97%  BP/Weight 04/06/2016 03/21/2016 [id number]  Systolic BP 87 98 -  Diastolic BP 60 57 -  Wt. (Lbs) 177 -  191.58  BMI 24.7 - 27.49     Physical Exam  Constitutional: He appears well-developed and well-nourished. No distress.  HENT:  Head: Normocephalic and atraumatic.  Neck: Normal range of motion. Neck supple.  Cardiovascular: Normal rate, regular rhythm, normal heart sounds and intact distal pulses.   Pulmonary/Chest: Effort normal and breath sounds normal.  Abdominal: Soft. Bowel sounds are normal. He exhibits no distension and no mass. There is tenderness in the left upper quadrant and left lower quadrant. There is no rebound and no guarding.  Healing abdominal scars  Musculoskeletal: He exhibits no edema.  Neurological: He is alert.  Skin: Skin is warm and dry. No rash noted. No erythema.  Psychiatric: He has a normal mood and affect.     Assessment & Plan:   There are no diagnoses linked to this encounter. Zariah was seen today for hernia.  Diagnoses and all orders for this visit:  SBO (small bowel obstruction) ([facility]) -  CBC -     COMPLETE METABOLIC PANEL WITH GFR -     DG Abd 2 Views; Future  Alcoholic cirrhosis of liver with ascites (HCC) -     CBC -     COMPLETE METABOLIC PANEL WITH GFR  Bilateral lower abdominal pain -     traMADol (ULTRAM) 50 MG tablet; Take 1 tablet (50 mg total) by mouth every 8 (eight) hours as needed.  Incarcerated umbilical hernia   Meds ordered this encounter  Medications  . traMADol (ULTRAM) 50 MG tablet    Sig: Take 1 tablet (50 mg total) by mouth every 8 (eight) hours as needed.    Dispense:  30 tablet    Refill:  0    Follow-up: No Follow-up on file.   Boykin Nearing MD

## 2016-04-06 NOTE — Progress Notes (Signed)
Stomach pain  Pain scale # stated no pain today  Hernia repair 3 weeks ago  Tobacco user 1ppday  No suicidal thoughts in the past two weeks  No able to sleep, "thinking crazy stuff" Pt refused to complete depression screening

## 2016-04-07 ENCOUNTER — Telehealth: Payer: Self-pay | Admitting: Family Medicine

## 2016-04-07 NOTE — Telephone Encounter (Signed)
Called patient Verified name and DOB.  He is still having abdominal pain from 5-9/10. He is eating liquid diet. No nausea or vomiting. No abdominal distension. Passing gas and loose stool.  He was unable to get tramadol filled yesterday due to long wait at the pharmacy.  He is not taking anything for pain.  Urine is dark.  He received by message yesterday. Gave lab results.  Plan: Take lasix and aldactone once daily for now, with plan to hold if urine stays darks after decreasing to once daily. Continue liquid diet. Keep gen surg f/u. He will come to clinic on Monday  04/09/16 to pick up tramadol from pharmacy.  Go to ED for severe persistent pain, nausea or vomiting. He states he prefers to stay out of ED but agrees to go he develops severe persistent pain, nausea or vomiting.

## 2016-04-07 NOTE — Assessment & Plan Note (Signed)
2 days of recurrent abdominal pain in setting of recent small bowel obstruction following incarcerated umbilical hernia repair and partial small bowel resection. No N/V, still passing gas and stool.  Abdominal x-ray ordered, patient advised to have x-ray done today, tramadol to be used sparingly for pain control, full liquid diet, CBC, CMP, gen surg f/u scheduled, patient given detailed and specific instructions to present to ED if he develops N/V, worsening abdominal pain.   Persistent partial SBO on repeat Abdominal x-ray.  Patient called and I left a VM with instructions to continue plan, reiterated precautions.  CBC and CMP obtained and reviewed he has slightly low sodium and chloride and increase in Cr on lasix and aldactone which he takes for fluid maintenance in setting of cirrhosis. Plan to decrease aldactone and lasix to 1/2 dose until obstruction resolves, so patient instructed to take lasix 40 mg daily with aldactone 100 mg for now. Patient called.

## 2016-04-07 NOTE — Assessment & Plan Note (Signed)
S/p repair No recurrent hernia on exam  I called to schedule patient's post op f/u

## 2016-04-09 NOTE — Telephone Encounter (Signed)
Called patient to check in. Left VM requesting a call back with questions.   Please see previous phone note for details as well as OV note from 04/06/16.

## 2016-04-17 MED FILL — LACTULOSE 10 GM/15 ML SOLN: 10 | 30 days supply | Qty: 946 | Fill #2

## 2016-04-24 MED FILL — SPIRONOLACTONE 100 MG TAB: 100 | 30 days supply | Qty: 60 | Fill #2

## 2016-04-24 MED FILL — OMEPRAZOLE DR 20 MG CAPSULE: 20 | 30 days supply | Qty: 60 | Fill #4

## 2016-05-11 ENCOUNTER — Other Ambulatory Visit: Payer: Self-pay | Admitting: Gastroenterology

## 2016-05-11 ENCOUNTER — Telehealth: Payer: Self-pay | Admitting: Gastroenterology

## 2016-05-11 DIAGNOSIS — K7460 Unspecified cirrhosis of liver: Secondary | ICD-10-CM

## 2016-05-11 MED ORDER — NADOLOL 20 MG PO TABS: 20.0000 mg | ORAL_TABLET | Freq: Every day | ORAL | Status: DC

## 2016-05-11 MED FILL — FUROSEMIDE 40 MG TABLET: 40 | 30 days supply | Qty: 60 | Fill #2

## 2016-05-11 MED FILL — NADOLOL 20 MG TABLET: 20 | 30 days supply | Qty: 30 | Fill #0

## 2016-05-11 MED FILL — CETIRIZINE HCL 10 MG TABLET: 10 | 30 days supply | Qty: 30 | Fill #8

## 2016-05-11 NOTE — Telephone Encounter (Signed)
Pt is aware.  

## 2016-05-11 NOTE — Telephone Encounter (Signed)
Pt needs his Nadolol 20 mg one daily filled.

## 2016-05-11 NOTE — Telephone Encounter (Signed)
Please tell the patient that his refill was sent tot he pharmacy as requested.

## 2016-05-11 NOTE — Telephone Encounter (Signed)
Pt called to say that he needed his andolol prescription called into [facility] and [place]

## 2016-05-23 MED FILL — LACTULOSE 10 GM/15 ML SOLN: 10 | 30 days supply | Qty: 946 | Fill #3

## 2016-05-29 MED FILL — ?OMEPRAZOLE DR 20 MG CAPSUL: 20 | 30 days supply | Qty: 60 | Fill #5

## 2016-05-29 MED FILL — SPIRONOLACTONE 100 MG TAB: 100 | 30 days supply | Qty: 60 | Fill #3

## 2016-06-11 MED FILL — FUROSEMIDE 40 MG TABLET: 40 | 30 days supply | Qty: 60 | Fill #3

## 2016-06-11 MED FILL — ?CETIRIZINE HCL 10 MG TABLE: 10 | 30 days supply | Qty: 30 | Fill #9

## 2016-06-11 MED FILL — NADOLOL 20 MG TABLET: 20 | 30 days supply | Qty: 30 | Fill #1

## 2016-06-27 ENCOUNTER — Telehealth: Payer: Self-pay | Admitting: Gastroenterology

## 2016-06-27 DIAGNOSIS — R188 Other ascites: Secondary | ICD-10-CM

## 2016-06-27 DIAGNOSIS — K7031 Alcoholic cirrhosis of liver with ascites: Secondary | ICD-10-CM

## 2016-06-27 MED ORDER — FUROSEMIDE 40 MG PO TABS: ORAL_TABLET | ORAL | 5 refills | Status: DC

## 2016-06-27 MED ORDER — SPIRONOLACTONE 100 MG PO TABS: 100.0000 mg | ORAL_TABLET | Freq: Two times a day (BID) | ORAL | 5 refills | Status: DC

## 2016-06-27 MED FILL — SPIRONOLACTONE 100 MG TAB: 100 | 30 days supply | Qty: 60 | Fill #0

## 2016-06-27 NOTE — Addendum Note (Signed)
Addended by: Gordy Levan, Komal Stangelo A on: 06/27/2016 02:41 PM   Modules accepted: Orders

## 2016-06-27 NOTE — Telephone Encounter (Signed)
Pt called to say that he needed refills on spironolactone 100mg  and furosemide 40mg . He uses [facility] phone# [phone]

## 2016-06-27 NOTE — Telephone Encounter (Signed)
Sending to the refill box.  

## 2016-06-27 NOTE — Telephone Encounter (Signed)
Please notify the patient that refills were sent as requested.

## 2016-06-28 MED FILL — FUROSEMIDE 20 MG TABLET: 20 | 30 days supply | Qty: 120 | Fill #0

## 2016-06-28 NOTE — Telephone Encounter (Signed)
LMOM that the prescriptions have been sent in.

## 2016-07-04 NOTE — Telephone Encounter (Signed)
PT NEEDS OPV E30 CIRRHOSIS WITHIN THE NEXT MO.

## 2016-07-05 ENCOUNTER — Encounter: Payer: Self-pay | Admitting: Gastroenterology

## 2016-07-05 NOTE — Telephone Encounter (Signed)
appt made and letter sent  °

## 2016-07-17 MED FILL — NADOLOL 20 MG TABLET: 20 | 30 days supply | Qty: 30 | Fill #2

## 2016-07-17 MED FILL — ?CETIRIZINE HCL 10 MG TABLE: 10 | 30 days supply | Qty: 30 | Fill #10

## 2016-07-25 MED FILL — SPIRONOLACTONE 100 MG TAB: 100 | 30 days supply | Qty: 60 | Fill #1

## 2016-07-25 MED FILL — ?FUROSEMIDE 20 MG TABLET: 20 | 30 days supply | Qty: 120 | Fill #1

## 2016-07-26 ENCOUNTER — Ambulatory Visit: Payer: Medicaid Other | Attending: Internal Medicine | Admitting: Physician Assistant

## 2016-07-26 VITALS — BP 96/60 | HR 60 | Temp 97.9°F | Wt 195.2 lb

## 2016-07-26 DIAGNOSIS — R109 Unspecified abdominal pain: Secondary | ICD-10-CM | POA: Diagnosis not present

## 2016-07-26 DIAGNOSIS — K7460 Unspecified cirrhosis of liver: Secondary | ICD-10-CM | POA: Insufficient documentation

## 2016-07-26 DIAGNOSIS — Z23 Encounter for immunization: Secondary | ICD-10-CM | POA: Diagnosis not present

## 2016-07-26 MED ORDER — DICYCLOMINE HCL 20 MG PO TABS: 20.0000 mg | ORAL_TABLET | Freq: Four times a day (QID) | ORAL | 0 refills | Status: DC

## 2016-07-26 MED FILL — ?DICYCLOMINE 20 MG TABLET: 20 | 10 days supply | Qty: 40 | Fill #0

## 2016-07-26 NOTE — Progress Notes (Signed)
Patient ID: Kevin Nicholson, male   DOB: 1961/06/03, 55 y.o.   MRN: [id number]   Kevin Nicholson, is a 55 y.o. male  [phone]  [id number]  DOB - 12-20-1960  Subjective:  Chief Complaint and HPI: Kevin Nicholson is a 55 y.o. male here today for intermittent abdominal cramping. He has this occasionally for the last month.  No known exacerbating or alleviating factors.  His appetite is normal.  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  He was seen for a check up about 2 weeks ago.  His lab work was stable.  He did not mention the pain to them at that time.  About 2 weeks ago, he had a flare up of abdominal cramping that lasted on and off for 3 days.  He describes the pain as sharp and stabbing.  Moderate to severe in nature. It "doubled him over" in pain when it occurred.  The pain was fleeting but frequent over those few days.  He has had pain like this before.  He took some tramadol he had leftover from an old prescription which helped.  He deneis urinary or prostate symptomes.  No N/V/D.  He no longer drinks alcohol.    ROS:   Constitutional:  No f/c, No night sweats, No unexplained weight loss. EENT:  No vision changes, No blurry vision, No hearing changes. No mouth, throat, or ear problems.  Respiratory: No cough, No SOB Cardiac: No CP, no palpitations GI:  +abd pain, No N/V/D. GU: No Urinary s/sx Musculoskeletal: No joint pain Neuro: No headache, no dizziness, no motor weakness.  Skin: No rash Endocrine:  No polydipsia. No polyuria.  Psych: Denies SI/HI  No problems updated.  ALLERGIES: No Known Allergies  PAST MEDICAL HISTORY: Past Medical History:  Diagnosis Date  . Ascites   . Cirrhosis ([place])    varices  . Shortness of breath dyspnea    with stair climbing  . Ventral hernia     MEDICATIONS AT HOME: Prior to Admission medications   Medication Sig Start Date End Date Taking?  Authorizing Provider  albuterol (PROVENTIL HFA;VENTOLIN HFA) 108 (90 BASE) MCG/ACT inhaler Inhale 2 puffs into the lungs every 6 (six) hours as needed for wheezing or shortness of breath. 08/09/15   Josalyn Funches, MD  bismuth subsalicylate (PEPTO BISMOL) 262 MG/15ML suspension Take 30 mLs by mouth once as needed (abdominal pain.). Reported on 04/06/2016    Historical Provider, MD  cetirizine (ZYRTEC) 10 MG tablet Take 1 tablet (10 mg total) by mouth daily. 08/23/15   Josalyn Funches, MD  dicyclomine (BENTYL) 20 MG tablet Take 1 tablet (20 mg total) by mouth every 6 (six) hours. Prn abdominal cramping 07/26/16   Argentina Donovan, PA-C  fluticasone [facility]) 50 MCG/ACT nasal spray Place 2 sprays into both nostrils daily. 08/09/15   Boykin Nearing, MD  furosemide (LASIX) 40 MG tablet Take 40 mg BID 06/27/16   Carlis Stable, NP  lactulose (CHRONULAC) 10 GM/15ML solution Take 15 mLs (10 g total) by mouth 2 (two) times daily as needed for mild constipation. 08/09/15   Josalyn Funches, MD  nadolol (CORGARD) 20 MG tablet Take 1 tablet (20 mg total) by mouth daily. 05/11/16   Carlis Stable, NP  omeprazole (PRILOSEC) 20 MG capsule 1 po bid 30 minutes before meals for 3 mos then once daily FOREVER 09/13/15   Danie Binder, MD  Phenyleph-CPM-DM-APAP (ALKA-SELTZER PLUS COLD &  FLU) 03-20-09-250 MG TBEF Take 1 tablet by mouth once as needed (abdominal pain.). Reported on 04/06/2016    Historical Provider, MD  spironolactone (ALDACTONE) 100 MG tablet Take 1 tablet (100 mg total) by mouth 2 (two) times daily. 06/27/16   Carlis Stable, NP  traMADol (ULTRAM) 50 MG tablet Take 1 tablet (50 mg total) by mouth every 8 (eight) hours as needed. 04/06/16   Josalyn Funches, MD     Objective:  EXAM:   Vitals:   07/26/16 1142  BP: 96/60  Pulse: 60  Temp: 97.9 F (36.6 C)  TempSrc: Oral  SpO2: 99%  Weight: 195 lb 3.2 oz (88.5 kg)    General appearance : A&OX3. NAD. Non-toxic-appearing HEENT: Atraumatic and Normocephalic.  PERRLA.  EOM intact.  TM clear B. Mouth-MMM, post pharynx WNL w/o erythema, No PND. Neck: supple, no JVD. No cervical lymphadenopathy. No thyromegaly Chest/Lungs:  Breathing-non-labored, Good air entry bilaterally, breath sounds normal without rales, rhonchi, or wheezing  CVS: S1 S2 regular, no murmurs, gallops, rubs  Abdomen: Bowel sounds present, Non- distended with no gaurding, rigidity or rebound.  No ascites/fluid wave.  Mild generalized tenderness throughout-no point TTP Extremities: Bilateral Lower Ext shows no edema, both legs are warm to touch with = pulse throughout Neurology:  CN II-XII grossly intact, Non focal.   Psych:  TP linear. J/I WNL. Normal speech. Appropriate eye contact and affect.  Skin:  No Rash  Data Review Lab Results  Component Value Date   HGBA1C 5.0 12/01/2015     Assessment & Plan   1. Abdominal cramping Non-acute abdomen.  Pain is intermittent and hasnt occurred in at least 2 weeks.  Bentyl 20mg  1 qid prn.  Tramadol has helped but will avoid use if at all possible possible due to chemical dependence properties and history of alcoholism.    Patient have been counseled extensively about nutrition and exercise  Return in about 3 weeks (around 08/16/2016) for check up with Dr. Adrian Blackwater.  The patient was given clear instructions to go to ER or return to medical center if symptoms don't improve, worsen or new problems develop. The patient verbalized understanding. The patient was told to call to get lab results if they haven't heard anything in the next week.     Freeman Caldron, PA-C [facility] and [place], [place]   07/26/2016, 12:07 PM

## 2016-08-03 ENCOUNTER — Other Ambulatory Visit: Payer: Self-pay | Admitting: Family Medicine

## 2016-08-03 DIAGNOSIS — J3489 Other specified disorders of nose and nasal sinuses: Secondary | ICD-10-CM

## 2016-08-03 MED FILL — VENTOLIN HFA 90 MCG INHALER: 108 (90 BAS | 25 days supply | Qty: 18 | Fill #1

## 2016-08-03 MED FILL — LACTULOSE 10 GM/15 ML SOLN: 10 | 30 days supply | Qty: 946 | Fill #4

## 2016-08-03 MED FILL — FLUTICASONE PROP 50 MCG SPR: 50 | 30 days supply | Qty: 16 | Fill #0

## 2016-08-08 ENCOUNTER — Encounter: Payer: Self-pay | Admitting: Gastroenterology

## 2016-08-08 ENCOUNTER — Telehealth: Payer: Self-pay | Admitting: Gastroenterology

## 2016-08-08 ENCOUNTER — Ambulatory Visit (INDEPENDENT_AMBULATORY_CARE_PROVIDER_SITE_OTHER): Payer: Medicaid Other | Admitting: Gastroenterology

## 2016-08-08 VITALS — BP 102/59 | HR 66 | Temp 98.6°F | Ht 71.0 in | Wt 182.8 lb

## 2016-08-08 DIAGNOSIS — K7030 Alcoholic cirrhosis of liver without ascites: Secondary | ICD-10-CM

## 2016-08-08 NOTE — Addendum Note (Signed)
Addended by: Annitta Needs on: 08/08/2016 03:55 PM   Modules accepted: Orders

## 2016-08-08 NOTE — Progress Notes (Signed)
CC'ED TO PCP 

## 2016-08-08 NOTE — Progress Notes (Signed)
Referring Provider: Boykin Nearing, MD Primary Care Physician:  Minerva Ends, MD Primary GI: Dr. Oneida Alar   Chief Complaint  Patient presents with  . Follow-up    HPI:   Kevin Nicholson is a 55 y.o. male presenting today with a history of possible ETOH cirrhosis, multiple LVAPs in past. Last EGD in Feb 2017 with 2 columns of Grade 1 varices, mild gastropathy, mild gastritis. Hemochromatosis DNA negative. Positive ASMA but transaminases normal in the past. Alk Phos mildly elevated. Has been seen by [facility] liver clinic and established care with Dr. Claudia Pollock. No need for transplant. Saw Dr. Claudia Pollock. Next US abdomen due in Nov 2017.   Coughing today. Just got the flu shot. Saw [facility] liver clinic about 3 weeks ago. Denies any mental status changes. No constipation or diarrhea. No rectal bleeding. Denies any ETOH use currently. Vague abdominal discomfort that comes and goes and points above umbilicus. Will double him over at times. Takes Bentyl, which takes care of the discomfort. Symptoms started after hernia repair. Only present for a month. Not associated with any N/V.   Past Medical History:  Diagnosis Date  . Ascites   . Cirrhosis ([place])    varices  . Shortness of breath dyspnea    with stair climbing  . Ventral hernia     Past Surgical History:  Procedure Laterality Date  . BACK SURGERY  2000  . BIOPSY N/A 09/13/2015   Procedure: BIOPSY;  Surgeon: Danie Binder, MD;  Location: [facility] ORS;  Service: Endoscopy;  Laterality: N/A;  gastric,   . BIOPSY  01/03/2016   Procedure: BIOPSY;  Surgeon: Danie Binder, MD;  Location: [facility] ENDO SUITE;  Service: Endoscopy;;  gastric bx's  . COLONOSCOPY WITH PROPOFOL N/A 01/03/2016   Dr. Oneida Alar: 2 simple adenomas, 9 hyperplastic polyps, colonoscopy in 5 years   . ESOPHAGOGASTRODUODENOSCOPY (EGD) WITH PROPOFOL N/A 09/13/2015   Dr. 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 +H.pylori . Surveillance due in Jan 2017 for ulcer  healing  . ESOPHAGOGASTRODUODENOSCOPY (EGD) WITH PROPOFOL N/A 01/03/2016   Dr. Oneida Alar: 2 columns of Grade 1 varices, mild gastropathy, mild gastritis   . 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 POLYPECTOMY  01/03/2016   Procedure: POLYPECTOMY;  Surgeon: Danie Binder, MD;  Location: [facility] ENDO SUITE;  Service: Endoscopy;;  transverse colon polyps x2, sigmoid colon polyps x4, rectal polyps x5  . UMBILICAL HERNIA REPAIR N/A 03/11/2016   Procedure: laparoscopy, open HERNIA REPAIR UMBILICAL ADULT, small bowel resection ;  Surgeon: Leighton Ruff, MD;  Location: [facility] ORS;  Service: General;  Laterality: N/A;    Current Outpatient Prescriptions  Medication Sig Dispense Refill  . albuterol (PROVENTIL HFA;VENTOLIN HFA) 108 (90 BASE) MCG/ACT inhaler Inhale 2 puffs into the lungs every 6 (six) hours as needed for wheezing or shortness of breath. 1 Inhaler 2  . bismuth subsalicylate (PEPTO BISMOL) 262 MG/15ML suspension Take 30 mLs by mouth once as needed (abdominal pain.). Reported on 04/06/2016    . cetirizine (ZYRTEC) 10 MG tablet Take 1 tablet (10 mg total) by mouth daily. 30 tablet 11  . dicyclomine (BENTYL) 20 MG tablet Take 1 tablet (20 mg total) by mouth every 6 (six) hours. Prn abdominal cramping 40 tablet 0  . fluticasone (FLONASE) 50 MCG/ACT nasal spray PLACE 2 SPRAYS INTO BOTH NOSTRILS DAILY. 16 g 2  . furosemide (LASIX) 40 MG tablet Take 40  mg BID 60 tablet 5  . lactulose (CHRONULAC) 10 GM/15ML solution Take 15 mLs (10 g total) by mouth 2 (two) times daily as needed for mild constipation. 946 mL 5  . nadolol (CORGARD) 20 MG tablet Take 1 tablet (20 mg total) by mouth daily. 30 tablet 5  . omeprazole (PRILOSEC) 20 MG capsule 1 po bid 30 minutes before meals for 3 mos then once daily FOREVER 60 capsule 11  . Phenyleph-CPM-DM-APAP (ALKA-SELTZER PLUS COLD & FLU) 03-20-09-250 MG TBEF Take 1 tablet by mouth once as needed (abdominal pain.). Reported on 04/06/2016    .  spironolactone (ALDACTONE) 100 MG tablet Take 1 tablet (100 mg total) by mouth 2 (two) times daily. 60 tablet 5  . traMADol (ULTRAM) 50 MG tablet Take 1 tablet (50 mg total) by mouth every 8 (eight) hours as needed. 30 tablet 0   No current facility-administered medications for this visit.     Allergies as of 08/08/2016  . (No Known Allergies)    Family History  Problem Relation Age of Onset  . Cancer Mother   . Cancer Father   . Colon cancer Neg Hx   . Liver disease Neg Hx     Social History   Social History  . Marital status: Single    Spouse name: N/A  . Number of children: N/A  . Years of education: N/A   Social History Main Topics  . Smoking status: Current Every Day Smoker    Packs/day: 1.00    Years: 45.00    Types: Cigarettes  . Smokeless tobacco: None  . 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 Drug use: No  . Sexual activity: Not Asked   Other Topics Concern  . None   Social History Narrative  . None    Review of Systems: As mentioned in HPI   Physical Exam: BP (!) 102/59   Pulse 66   Temp 98.6 F (37 C) (Oral)   Ht '5\' 11"'$  (1.803 m)   Wt 182 lb 12.8 oz (82.9 kg)   BMI 25.50 kg/m  General:   Alert and oriented. No distress noted. Pleasant and cooperative.  Head:  Normocephalic and atraumatic. Eyes:  Conjuctiva clear without scleral icterus. Mouth:  Oral mucosa pink and moist. Good dentition. No lesions. Neck:  Supple, without mass or thyromegaly. Heart:  S1, S2 present without murmurs, rubs, or gallops. Regular rate and rhythm. Abdomen:  +BS, soft, non-tender and non-distended. No rebound or guarding. No HSM or masses noted. Msk:  Symmetrical without gross deformities. Normal posture. Pulses:  2+ DP noted bilaterally Extremities:  Without edema. Neurologic:  Alert and  oriented x4;  grossly normal neurologically. Skin:  Intact without significant lesions or rashes. Cervical Nodes:  No significant cervical  adenopathy. Psych:  Alert and cooperative. Normal mood and affect.  Lab Results  Component Value Date   IRON 207 (H) 12/19/2015   TIBC 294 12/19/2015   FERRITIN 575 (H) 12/19/2015   Iron saturations 70% Jan 2017.   Lab Results  Component Value Date   ALT 20 04/06/2016   AST 27 04/06/2016   ALKPHOS 140 (H) 04/06/2016   BILITOT 1.3 (H) 04/06/2016   Lab Results  Component Value Date   INR 1.30 03/19/2016   INR 1.16 03/11/2016   INR 1.11 12/19/2015

## 2016-08-08 NOTE — Telephone Encounter (Signed)
I have just printed labs for iron, ferritin, TIBC. Just spoke with Dawn at the [facility].  Patient needs these labs completed. If they are still elevated, we will refer to Hematology. Last these were drawn were in Jan. Very important he has done.

## 2016-08-08 NOTE — Patient Instructions (Signed)
We will arrange an ultrasound of your liver in Nov 2017.  We will see you in 6 months  I have referred you back to Dr. Leighton Ruff for a follow-up.

## 2016-08-08 NOTE — Assessment & Plan Note (Addendum)
55 year old male with history of likely ETOH cirrhosis. Positive ASMA but transaminases normal. Iron sats significantly elevated, ferritin elevated in the 500 range in Jan 2017; he was lost to follow-up and underwent incarcerated umbilical hernia repair in April 2017. Thankfully, he has kept up with [facility] for visits and tells me he was recently seen by Roosevelt Locks, NP. Discussed with Dawn Drazek. Will repeat iron studies. If still elevated, will refer to Hematology for potential phlebotomy due to iron overload. Likely a secondary effect in the setting of ETOH cirrhosis.  Hemochromatosis labs negative.Currently, he is fairly well compensated despite his several setbacks he has had since last seen. Next [place] due in Nov 2017. Remains on Nadolol daily. Return in 6 months. Further recommendations to follow.   As of note, describes intermittent vague abdominal discomfort supraumbilically without any exacerbating factors, starting after the hernia repair. No acute findings on exam and no other associated symptoms. Refer back to Dr. Leighton Ruff for further evaluation (surgeon who performed procedure).

## 2016-08-09 ENCOUNTER — Telehealth: Payer: Self-pay

## 2016-08-09 NOTE — Telephone Encounter (Signed)
Called and informed of follow-up appt at [facility] with Dr. Marcello Moores 09/03/16 at 11:00am.

## 2016-08-09 NOTE — Telephone Encounter (Signed)
PT was informed and I mailed the orders to him so he could go to [facility] in [place].

## 2016-08-13 MED FILL — NADOLOL 20 MG TABLET: 20 | 30 days supply | Qty: 30 | Fill #3

## 2016-08-14 ENCOUNTER — Encounter: Payer: Self-pay | Admitting: Family Medicine

## 2016-08-14 ENCOUNTER — Ambulatory Visit: Payer: Medicaid Other | Attending: Family Medicine | Admitting: Family Medicine

## 2016-08-14 DIAGNOSIS — Z0000 Encounter for general adult medical examination without abnormal findings: Secondary | ICD-10-CM | POA: Diagnosis not present

## 2016-08-14 DIAGNOSIS — R1032 Left lower quadrant pain: Secondary | ICD-10-CM

## 2016-08-14 DIAGNOSIS — R109 Unspecified abdominal pain: Secondary | ICD-10-CM | POA: Diagnosis present

## 2016-08-14 DIAGNOSIS — F17210 Nicotine dependence, cigarettes, uncomplicated: Secondary | ICD-10-CM | POA: Diagnosis not present

## 2016-08-14 DIAGNOSIS — R1031 Right lower quadrant pain: Secondary | ICD-10-CM | POA: Diagnosis not present

## 2016-08-14 DIAGNOSIS — F17200 Nicotine dependence, unspecified, uncomplicated: Secondary | ICD-10-CM

## 2016-08-14 DIAGNOSIS — Z720 Tobacco use: Secondary | ICD-10-CM

## 2016-08-14 LAB — IRON AND TIBC
%SAT: 83 % — AB (ref 15–60)
Iron: 277 ug/dL — ABNORMAL HIGH (ref 50–180)
TIBC: 335 ug/dL (ref 250–425)
UIBC: 58 ug/dL — ABNORMAL LOW (ref 125–400)

## 2016-08-14 LAB — HEMOCCULT GUIAC POC 1CARD (OFFICE): FECAL OCCULT BLD: NEGATIVE

## 2016-08-14 LAB — FERRITIN: FERRITIN: 745 ng/mL — AB (ref 20–380)

## 2016-08-14 MED ORDER — TRAMADOL HCL 50 MG PO TABS: 25.0000 mg | ORAL_TABLET | Freq: Three times a day (TID) | ORAL | 0 refills | Status: DC | PRN

## 2016-08-14 MED FILL — traMADol HCL 50 MG TABS: 50 | 20 days supply | Qty: 30 | Fill #0

## 2016-08-14 NOTE — Progress Notes (Signed)
Pt is in today for a physical and check up  Pt has had medications today  Pt has not eaten

## 2016-08-14 NOTE — Patient Instructions (Addendum)
Jock was seen today for annual exam.  Diagnoses and all orders for this visit:  Iron overload -     Iron and TIBC -     Ferritin  Bilateral lower abdominal pain -     traMADol (ULTRAM) 50 MG tablet; Take 0.5 tablets (25 mg total) by mouth every 8 (eight) hours as needed. -     POCT occult blood stool  Current smoker   Smoking cessation support: smoking cessation hotline: 1-800-QUIT-NOW.  Smoking cessation classes are available through [facility] and [facility]. Call [phone] or visit our website at [email or web address]  Target brand patch 21 mg for 6 weeks  14 mg for 2 weeks  7 mg for 2 weeks  F/u in 3 months sooner if needed for check up   Dr. Adrian Blackwater

## 2016-08-14 NOTE — Progress Notes (Signed)
SUBJECTIVE:  Kevin Nicholson is a 55 y.o. male presenting for his annual checkup.  He repots intermittent abdominal pains just above the umbilicus. His GI doctor has referred him to general surgery for evaluation. He denies associated nausea, emesis and change in stools.   He has cough and runny nose since flu shot. He is not taking zyrtec right now but plans to buy some soon.   He is due for iron studies for history of elevated iron levels.   Smoking: would like to quit but resistant to chantix and wellbutrin. Amenable to trying nicotine patches.   Social History  Substance Use Topics  . Smoking status: Current Every Day Smoker    Packs/day: 1.00    Years: 45.00    Types: Cigarettes  . Smokeless tobacco: Not on file  . Alcohol use No     Comment: former heavy drinker, stopped in March 2016   Current Outpatient Prescriptions  Medication Sig Dispense Refill  . bismuth subsalicylate (PEPTO BISMOL) 262 MG/15ML suspension Take 30 mLs by mouth once as needed (abdominal pain.). Reported on 04/06/2016    . cetirizine (ZYRTEC) 10 MG tablet Take 1 tablet (10 mg total) by mouth daily. 30 tablet 11  . dicyclomine (BENTYL) 20 MG tablet Take 1 tablet (20 mg total) by mouth every 6 (six) hours. Prn abdominal cramping 40 tablet 0  . fluticasone (FLONASE) 50 MCG/ACT nasal spray PLACE 2 SPRAYS INTO BOTH NOSTRILS DAILY. 16 g 2  . furosemide (LASIX) 40 MG tablet Take 40 mg BID 60 tablet 5  . lactulose (CHRONULAC) 10 GM/15ML solution Take 15 mLs (10 g total) by mouth 2 (two) times daily as needed for mild constipation. 946 mL 5  . nadolol (CORGARD) 20 MG tablet Take 1 tablet (20 mg total) by mouth daily. 30 tablet 5  . omeprazole (PRILOSEC) 20 MG capsule 1 po bid 30 minutes before meals for 3 mos then once daily FOREVER 60 capsule 11  . spironolactone (ALDACTONE) 100 MG tablet Take 1 tablet (100 mg total) by mouth 2 (two) times daily. 60 tablet 5  . traMADol (ULTRAM) 50 MG tablet Take 1 tablet (50 mg  total) by mouth every 8 (eight) hours as needed. 30 tablet 0  . albuterol (PROVENTIL HFA;VENTOLIN HFA) 108 (90 BASE) MCG/ACT inhaler Inhale 2 puffs into the lungs every 6 (six) hours as needed for wheezing or shortness of breath. 1 Inhaler 2  . Phenyleph-CPM-DM-APAP (ALKA-SELTZER PLUS COLD & FLU) 03-20-09-250 MG TBEF Take 1 tablet by mouth once as needed (abdominal pain.). Reported on 04/06/2016     No current facility-administered medications for this visit.    Allergies: Review of patient's allergies indicates no known allergies.   ROS:  Feeling well. No dyspnea or chest pain on exertion. Positive abdominal pains.  No change in bowel habits, black or bloody stools. No urinary tract or prostatic symptoms. No neurological complaints.  OBJECTIVE:  The patient appears well, alert, oriented x 3, in no distress.  BP 103/67 (BP Location: Left Arm, Patient Position: Sitting, Cuff Size: Small)   Pulse 60   Temp 98 F (36.7 C) (Oral)   Resp 17   Ht 5\' 10"  (1.778 m)   Wt 187 lb 3.2 oz (84.9 kg)   SpO2 100%   BMI 26.86 kg/m  ENT normal.  Neck supple. No adenopathy or thyromegaly. PERLA. Lungs are clear, good air entry, no wheezes, rhonchi or rales. S1 and S2 normal, no murmurs, regular rate and rhythm. Abdomen is soft  without tenderness, guarding, mass or organomegaly. GU exam: no penile lesions or discharge, no testicular masses or tenderness, no hernias, guaiac negative brown stool.  Extremities show no edema, normal peripheral pulses. Neurological is normal without focal findings.  ASSESSMENT:  healthy adult male  PLAN:  quit smoking

## 2016-08-15 ENCOUNTER — Telehealth: Payer: Self-pay

## 2016-08-15 NOTE — Telephone Encounter (Signed)
Pt was called and informed of results and of referral being placed.

## 2016-08-15 NOTE — Addendum Note (Signed)
Addended by: Boykin Nearing on: 08/15/2016 11:18 AM   Modules accepted: Orders

## 2016-08-20 ENCOUNTER — Telehealth: Payer: Self-pay | Admitting: Hematology

## 2016-08-20 ENCOUNTER — Encounter: Payer: Self-pay | Admitting: Hematology

## 2016-08-20 NOTE — Telephone Encounter (Signed)
Pt confirmed appt, completed intake, mailed pt letter, in basketed referring provider appt date/time.

## 2016-08-27 MED FILL — FUROSEMIDE 20 MG TABLET: 20 | 30 days supply | Qty: 120 | Fill #2

## 2016-08-27 MED FILL — OMEPRAZOLE DR 20 MG CAPSULE: 20 | 30 days supply | Qty: 60 | Fill #6

## 2016-08-27 MED FILL — SPIRONOLACTONE 100 MG TAB: 100 | 30 days supply | Qty: 60 | Fill #2

## 2016-08-30 ENCOUNTER — Telehealth: Payer: Self-pay | Admitting: Gastroenterology

## 2016-08-30 NOTE — Telephone Encounter (Signed)
Nov recall for ultrasound

## 2016-08-30 NOTE — Progress Notes (Signed)
cc'ed to Roosevelt Locks, FNP

## 2016-08-30 NOTE — Progress Notes (Signed)
Lab Results  Component Value Date   IRON 277 (H) 08/14/2016   TIBC 335 08/14/2016   FERRITIN 745 (H) 08/14/2016     Patient referred to Hematology. Please send to Roosevelt Locks, FNP with Liver Clinic as FYI.

## 2016-08-30 NOTE — Progress Notes (Signed)
Ferritin levels elevated, iron level elevated. Thanks for FYI and appreciate routing to Hematology as per plan!

## 2016-08-30 NOTE — Telephone Encounter (Signed)
Letter mailed

## 2016-09-11 ENCOUNTER — Telehealth: Payer: Self-pay | Admitting: Hematology

## 2016-09-11 MED FILL — NADOLOL 20 MG TABLET: 20 | 30 days supply | Qty: 30 | Fill #4

## 2016-09-11 NOTE — Telephone Encounter (Signed)
PAL - CALLED PATEINT TO R/S 10/26 [place] APPOINTMENT TO 10/31. SPOKE WITH PATIENT HE IS AWARE.

## 2016-09-13 ENCOUNTER — Encounter: Payer: Medicaid Other | Admitting: Hematology

## 2016-09-18 ENCOUNTER — Other Ambulatory Visit: Payer: Self-pay

## 2016-09-18 ENCOUNTER — Encounter: Payer: Self-pay | Admitting: Hematology

## 2016-09-18 ENCOUNTER — Ambulatory Visit (HOSPITAL_BASED_OUTPATIENT_CLINIC_OR_DEPARTMENT_OTHER): Payer: Medicaid Other | Admitting: Hematology

## 2016-09-18 ENCOUNTER — Telehealth: Payer: Self-pay | Admitting: Gastroenterology

## 2016-09-18 DIAGNOSIS — K7031 Alcoholic cirrhosis of liver with ascites: Secondary | ICD-10-CM

## 2016-09-18 DIAGNOSIS — R7989 Other specified abnormal findings of blood chemistry: Secondary | ICD-10-CM | POA: Diagnosis not present

## 2016-09-18 NOTE — Telephone Encounter (Signed)
US Abdomen scheduled for 09/25/16 at 8:30am at [facility] (NPO after midnight). Called and informed pt, however he wanted to go to [facility]. Gave pt phone number to [facility] [employer] and he will call to have appt changed.

## 2016-09-18 NOTE — Telephone Encounter (Signed)
Patient received letter to call to schedule his ultrasound. Please call [phone]

## 2016-09-18 NOTE — Progress Notes (Signed)
Marland Kitchen    HEMATOLOGY/ONCOLOGY CONSULTATION NOTE  Date of Service: 09/18/2016  Patient Care Team: Boykin Nearing, MD as PCP - General (Family Medicine) Danie Binder, MD as Consulting Physician (Gastroenterology)  CHIEF COMPLAINTS/PURPOSE OF CONSULTATION:  Elevated ferritin levels  HISTORY OF PRESENTING ILLNESS:   Kevin Nicholson is a wonderful 55 y.o. male who has been referred to [place] by Dr .Boykin Nearing, MD  for evaluation and recommendation related to elevated Iron levels.  Patient is a history of alcoholic liver cirrhosis, ascites, esophageal varices who follows with rocking have gastroenterology for his liver related cares. As a part of workup for his liver cirrhosis he had iron studies done with elevation of his ferritin levels to the 700s with 83% iron saturation.  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 Has been smoking 1 pack per day of tobacco for the last 40 years. Denies being on iron containing supplements or iron pills. Does not use cast iron skillets or vessels for cooking. No family history of iron overload disorders. No family history of liver cirrhosis.  Has some chronic abdominal pain which he says are related to bowel adhesions. Never has been a blood donor.  Had an HFE gene mutation study which was negative for C282Y and H63D mutations. Denies any significant blood transfusions.  MEDICAL HISTORY:  Past Medical History:  Diagnosis Date  . Ascites   . Cirrhosis ([place])    varices  . Shortness of breath dyspnea    with stair climbing  . Ventral hernia     SURGICAL HISTORY: Past Surgical History:  Procedure Laterality Date  . BACK SURGERY  2000  . BIOPSY N/A 09/13/2015   Procedure: BIOPSY;  Surgeon: Danie Binder, MD;  Location: [facility];  Service: Endoscopy;  Laterality: N/A;  gastric,   . BIOPSY  01/03/2016   Procedure: BIOPSY;  Surgeon: Danie Binder, MD;  Location[facility];  Service: Endoscopy;;  gastric bx's  . COLONOSCOPY WITH PROPOFOL N/A 01/03/2016   Dr. Oneida Alar: 2 simple adenomas, 9 hyperplastic polyps, colonoscopy in 5 years   . ESOPHAGOGASTRODUODENOSCOPY (EGD) WITH PROPOFOL N/A 09/13/2015   Dr. 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 +H.pylori . Surveillance due in Jan 2017 for ulcer healing  . ESOPHAGOGASTRODUODENOSCOPY (EGD) WITH PROPOFOL N/A 01/03/2016   Dr. Oneida Alar: 2 columns of Grade 1 varices, mild gastropathy, mild gastritis   . 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 POLYPECTOMY  01/03/2016   Procedure: POLYPECTOMY;  Surgeon: Danie Binder, MD;  [facility];  Service: Endoscopy;;  transverse colon polyps x2, sigmoid colon polyps x4, rectal polyps x5  . UMBILICAL HERNIA REPAIR N/A 03/11/2016   Procedure: laparoscopy, open HERNIA REPAIR UMBILICAL ADULT, small bowel resection ;  Surgeon: Leighton Ruff, MD;  Location: [facility];  Service: General;  Laterality: N/A;    SOCIAL HISTORY: Social History   Social History  . Marital status: Single    Spouse name: N/A  . Number of children: N/A  . Years of education: N/A   Occupational History  . Not on file.   Social History Main Topics  . Smoking status: Current Every Day Smoker    Packs/day: 1.00    Years: 45.00    Types: Cigarettes  . Smokeless tobacco: Not on file  . 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 Drug use: No  . Sexual activity: Not on file   Other Topics Concern  . Not on file   Social History Narrative  . No narrative on file    FAMILY HISTORY: Family History  Problem Relation Age of Onset  . Cancer Mother   . Cancer Father   . Colon cancer Neg Hx   . Liver disease Neg Hx     ALLERGIES:  has No Known Allergies.  MEDICATIONS:  Current Outpatient Prescriptions  Medication Sig Dispense Refill  . albuterol (PROVENTIL HFA;VENTOLIN HFA) 108 (90 BASE)  MCG/ACT inhaler Inhale 2 puffs into the lungs every 6 (six) hours as needed for wheezing or shortness of breath. 1 Inhaler 2  . bismuth subsalicylate (PEPTO BISMOL) 262 MG/15ML suspension Take 30 mLs by mouth once as needed (abdominal pain.). Reported on 04/06/2016    . cetirizine (ZYRTEC) 10 MG tablet Take 1 tablet (10 mg total) by mouth daily. 30 tablet 11  . dicyclomine (BENTYL) 20 MG tablet Take 1 tablet (20 mg total) by mouth every 6 (six) hours. Prn abdominal cramping 40 tablet 0  . fluticasone (FLONASE) 50 MCG/ACT nasal spray PLACE 2 SPRAYS INTO BOTH NOSTRILS DAILY. 16 g 2  . furosemide (LASIX) 40 MG tablet Take 40 mg BID 60 tablet 5  . lactulose (CHRONULAC) 10 GM/15ML solution Take 15 mLs (10 g total) by mouth 2 (two) times daily as needed for mild constipation. 946 mL 5  . nadolol (CORGARD) 20 MG tablet Take 1 tablet (20 mg total) by mouth daily. 30 tablet 5  . omeprazole (PRILOSEC) 20 MG capsule 1 po bid 30 minutes before meals for 3 mos then once daily FOREVER 60 capsule 11  . Phenyleph-CPM-DM-APAP (ALKA-SELTZER PLUS COLD & FLU) 03-20-09-250 MG TBEF Take 1 tablet by mouth once as needed (abdominal pain.). Reported on 04/06/2016    . spironolactone (ALDACTONE) 100 MG tablet Take 1 tablet (100 mg total) by mouth 2 (two) times daily. 60 tablet 5  . traMADol (ULTRAM) 50 MG tablet Take 0.5 tablets (25 mg total) by mouth every 8 (eight) hours as needed. 30 tablet 0   No current facility-administered medications for this visit.     REVIEW OF SYSTEMS:    10 Point review of Systems was done is negative except as noted above.  PHYSICAL EXAMINATION: ECOG PERFORMANCE STATUS: 1 - Symptomatic but completely ambulatory  . Vitals:   09/18/16 0835  BP: (!) 124/59  Pulse: 65  Resp: 18  Temp: 97.4 F (36.3 C)   Filed Weights   09/18/16 0835  Weight: 189 lb 6.4 oz (85.9 kg)   .Body mass index is 27.18 kg/m.  GENERAL:alert, in no acute distress and comfortable SKIN: skin color, texture,  turgor are normal, no rashes or significant lesions EYES: normal, conjunctiva are pink and non-injected, sclera clear OROPHARYNX:no exudate, no erythema and lips, buccal mucosa, and tongue normal  NECK: supple, no JVD, thyroid normal size, non-tender, without nodularity LYMPH:  no palpable lymphadenopathy in the cervical, axillary or inguinal LUNGS: clear to auscultation with normal respiratory effort HEART: regular rate & rhythm,  no murmurs and no lower extremity edema ABDOMEN: abdomen soft, non-tender, normoactive bowel sounds, Mildly distended Musculoskeletal: no cyanosis of digits and no clubbing  PSYCH: alert & oriented x 3 with fluent speech NEURO: no focal motor/sensory deficits  LABORATORY DATA:  I have reviewed the data as listed  . CBC Latest Ref Rng & Units 04/06/2016  03/19/2016 03/17/2016  WBC 3.8 - 10.8 K/uL 10.2 9.8 8.6  Hemoglobin 13.2 - 17.1 g/dL 12.4(L) 11.8(L) 12.2(L)  Hematocrit 38.5 - 50.0 % 35.4(L) 33.6(L) 35.3(L)  Platelets 140 - 400 K/uL 257 209 206    . CMP Latest Ref Rng & Units 04/06/2016 03/21/2016 03/19/2016  Glucose 65 - 99 mg/dL 119(H) 77 94  BUN 7 - 25 mg/dL 27(H) 11 25(H)  Creatinine 0.70 - 1.33 mg/dL 1.26 0.58(L) 0.89  Sodium 135 - 146 mmol/L 131(L) 135 138  Potassium 3.5 - 5.3 mmol/L 4.6 3.5 3.8  Chloride 98 - 110 mmol/L 96(L) 103 104  CO2 20 - 31 mmol/L 26 22 25   Calcium 8.6 - 10.3 mg/dL 8.0(L) 7.5(L) 7.6(L)  Total Protein 6.1 - 8.1 g/dL 6.4 - 5.6(L)  Total Bilirubin 0.2 - 1.2 mg/dL 1.3(H) - 1.8(H)  Alkaline Phos 40 - 115 U/L 140(H) - 95  AST 10 - 35 U/L 27 - 20  ALT 9 - 46 U/L 20 - 17   . Lab Results  Component Value Date   IRON 277 (H) 08/14/2016   TIBC 335 08/14/2016   IRONPCTSAT 83 (H) 08/14/2016   (Iron and TIBC)  Lab Results  Component Value Date   FERRITIN 745 (H) 08/14/2016      RADIOGRAPHIC STUDIES: I have personally reviewed the radiological images as listed and agreed with the findings in the report. No results  found.  ASSESSMENT & PLAN:   55 year old male with  1) elevated ferritin and iron saturation levels. HFE gene mutation studies negative for C282Y and H63D mutation reducing the chance of clinically significant inherited hemochromatosis. No evidence of significant transfusions or PO iron intake to suggest Iron overload from that standpoint.  ETOH excess is known to lead to increased Iron absorption, also there can some cellular injury to the liver and spleen leading to release of ferritin from it's stores in the RE system in the liver or spleen. PLAN -Patient was recommended to continue absolute alcohol cessation -Avoid any iron containing supplements or iron pills. -Recommended not to use cast iron skillets for cooking. -Given his significant liver cirrhosis and increased risk of bleeding from his available varices the patient would likely not tolerate therapeutic phlebotomies. The chance of significant liver injury secondary to his elevated ferritin levels is relatively low at ferritin levels below 1000. -Abnormal liver function tests also precludes the option of considering iron chelation therapy due to the possibility of liver toxicity. -No other workup is indicated at this time -Continue follow-up with primary care physician and with Roseanne Kaufman with [facility].   All of the patients questions were answered with apparent satisfaction. The patient knows to call the clinic with any problems, questions or concerns.  I spent 30 minutes counseling the patient face to face. The total time spent in the appointment was 45 minutes and more than 50% was on counseling and direct patient cares.    Sullivan Lone MD [place] AAHIVMS [facility] Hematology/Oncology Physician [facility]  (Office):       [phone] (Work cell):  [phone] (Fax):           [phone]  09/18/2016 8:58 AM

## 2016-09-25 ENCOUNTER — Ambulatory Visit (HOSPITAL_COMMUNITY)
Admission: RE | Admit: 2016-09-25 | Discharge: 2016-09-25 | Disposition: A | Discharge status: Home / Self Care | Payer: Medicaid Other | Source: Ambulatory Visit | Attending: Gastroenterology | Admitting: Gastroenterology

## 2016-09-25 ENCOUNTER — Ambulatory Visit (HOSPITAL_COMMUNITY): Payer: Medicaid Other

## 2016-09-25 DIAGNOSIS — K7031 Alcoholic cirrhosis of liver with ascites: Secondary | ICD-10-CM | POA: Insufficient documentation

## 2016-10-02 MED FILL — SPIRONOLACTONE 100 MG TAB: 100 | 30 days supply | Qty: 60 | Fill #3

## 2016-10-03 NOTE — Progress Notes (Signed)
No HCC. Repeat in 6 months.

## 2016-10-03 NOTE — Progress Notes (Signed)
Left VM that [place] was fine and to repeat in 6 months.

## 2016-10-15 MED FILL — NADOLOL 20 MG TABLET: 20 | 30 days supply | Qty: 30 | Fill #5

## 2016-10-31 ENCOUNTER — Other Ambulatory Visit: Payer: Self-pay

## 2016-10-31 ENCOUNTER — Other Ambulatory Visit: Payer: Self-pay | Admitting: Gastroenterology

## 2016-10-31 DIAGNOSIS — R0602 Shortness of breath: Secondary | ICD-10-CM

## 2016-10-31 MED ORDER — ALBUTEROL SULFATE HFA 108 (90 BASE) MCG/ACT IN AERS: 2.0000 | INHALATION_SPRAY | Freq: Four times a day (QID) | RESPIRATORY_TRACT | 3 refills | Status: DC | PRN

## 2016-11-05 ENCOUNTER — Other Ambulatory Visit: Payer: Self-pay | Admitting: Nurse Practitioner

## 2016-11-05 DIAGNOSIS — K7460 Unspecified cirrhosis of liver: Secondary | ICD-10-CM

## 2016-11-05 IMAGING — CT CT ABD-PELV W/O CM
2 of 4 series · 16 of 46 positions shown, 18 images · non-contrast
Comparison: Most recent CT 04/13/2015

CLINICAL DATA: Abdominal pain, onset today. Nausea and vomiting.
Ventral hernia, question incarcerated.

EXAM:
CT ABDOMEN AND PELVIS WITHOUT CONTRAST
TECHNIQUE: Multidetector CT imaging of the abdomen and pelvis was performed
following the standard protocol without IV contrast.

[Series 2: abd/pel w/o · axial · non-contrast · 0.84mm/px · z∈[-424,+16]mm · 13 of 98 slices shown, 15 images]
[im 5/98  soft-tissue]
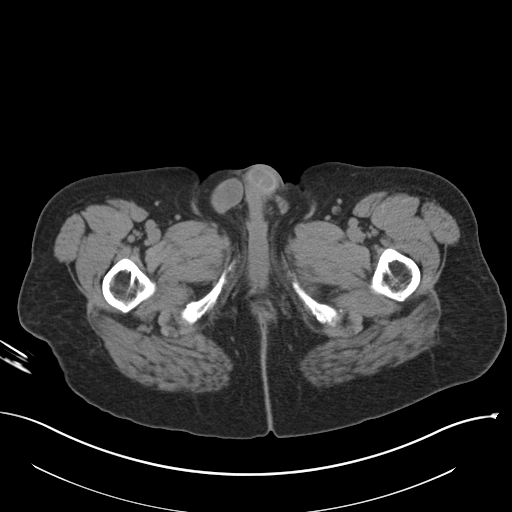
[im 5/98  bone]
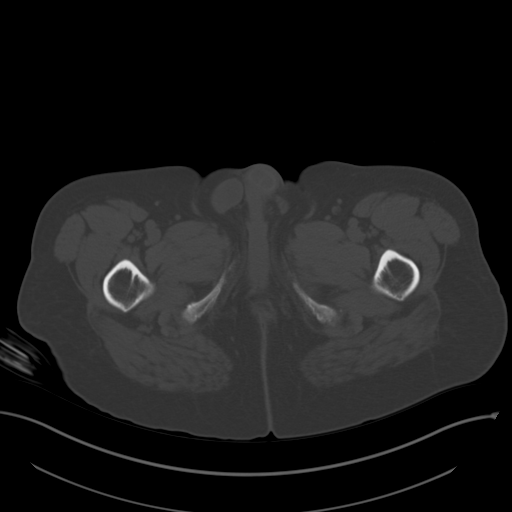
[im 15/98  soft-tissue]
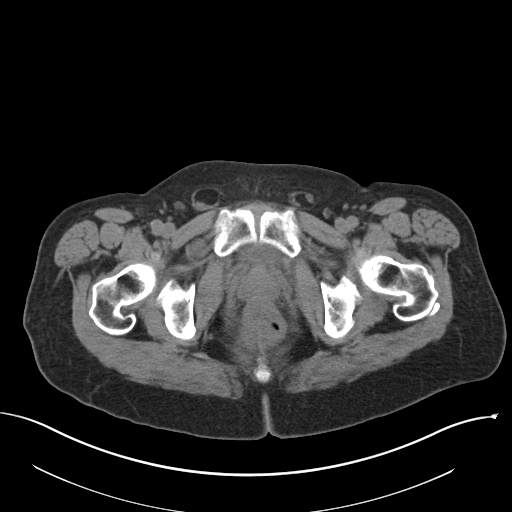
[im 20/98  soft-tissue]
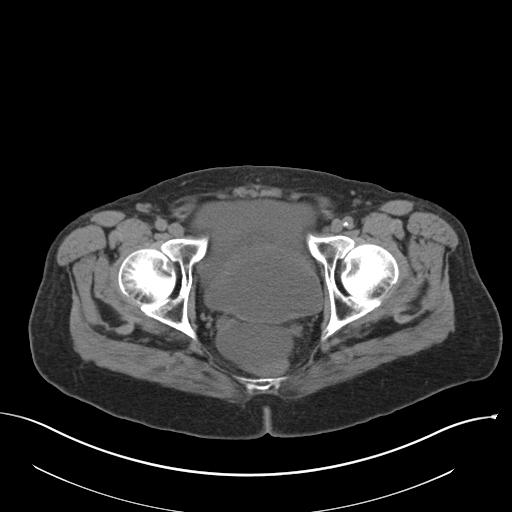
[im 30/98  soft-tissue]
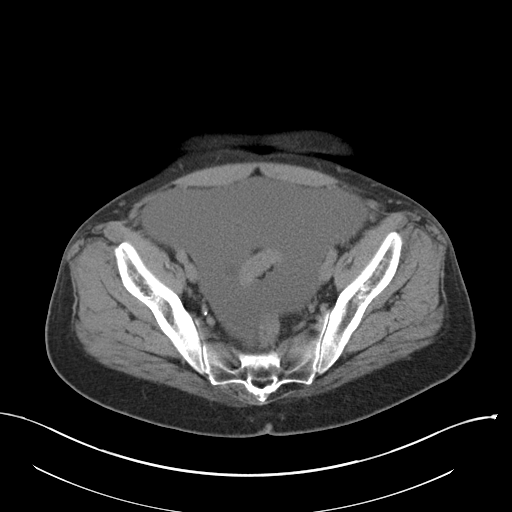
[im 34/98  soft-tissue]
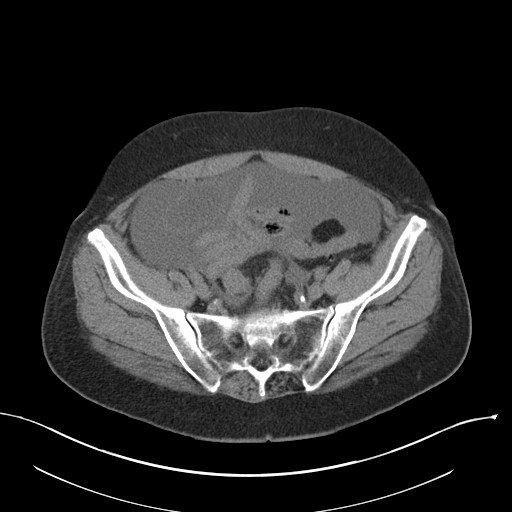
[im 44/98  soft-tissue]
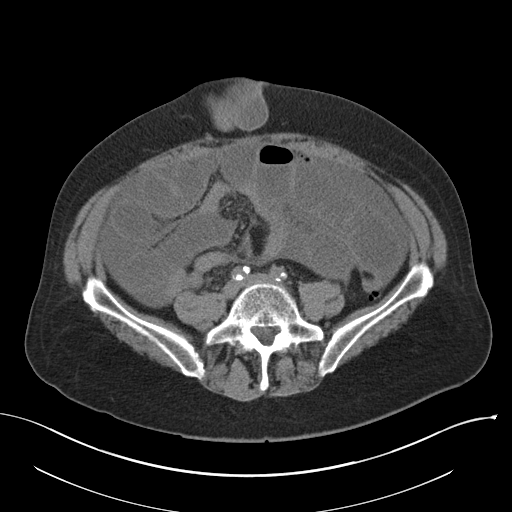
[im 49/98  soft-tissue]
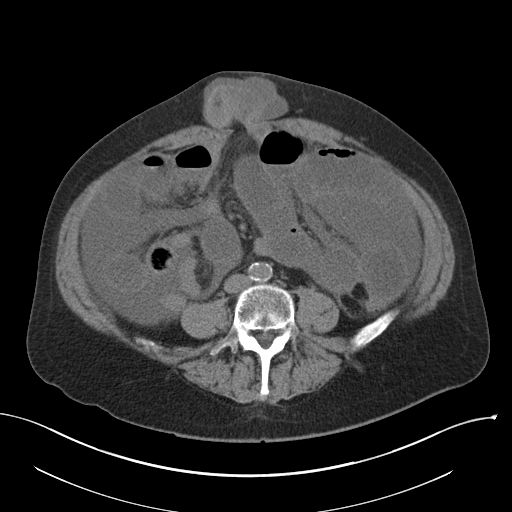
[im 54/98  soft-tissue]
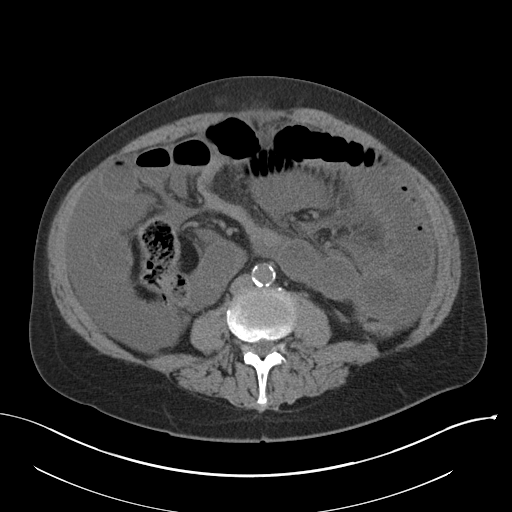
[im 64/98  soft-tissue]
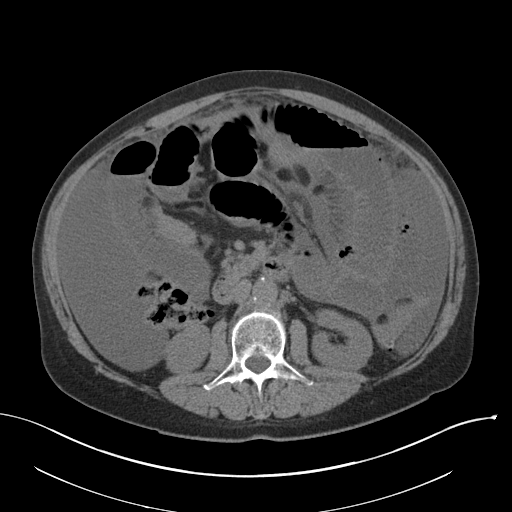
[im 64/98  bone]
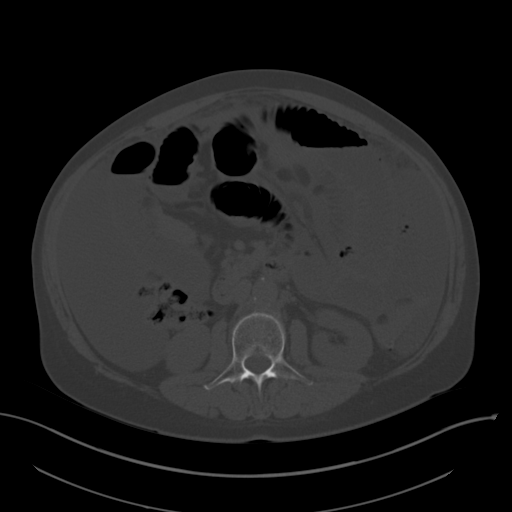
[im 68/98  soft-tissue]
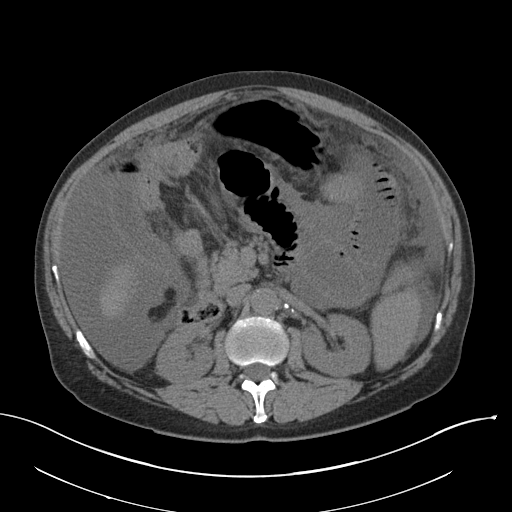
[im 78/98  soft-tissue]
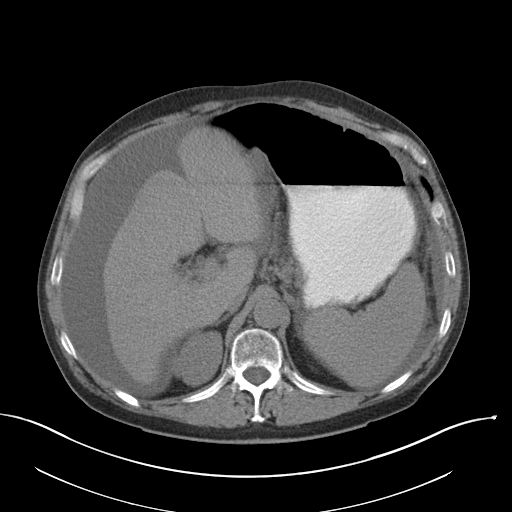
[im 83/98  soft-tissue]
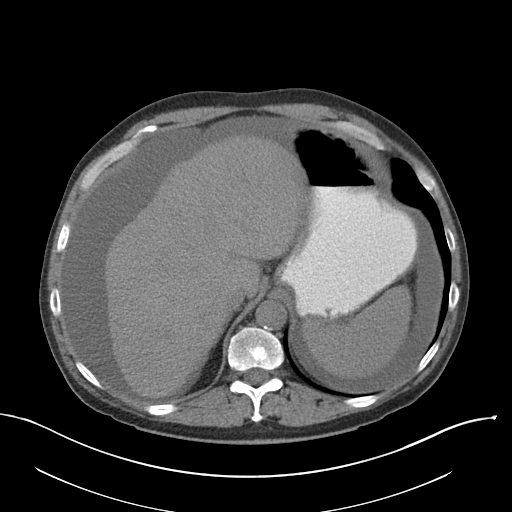
[im 93/98  soft-tissue]
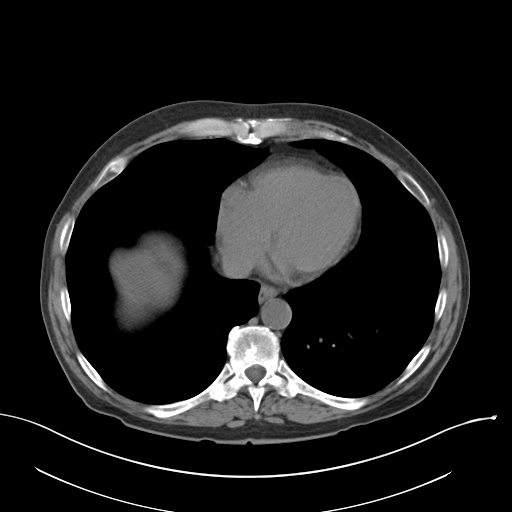

[Series 5: coronal · coronal · 0.79mm/px · 3 of 169 slices shown]
[im 57/169  soft-tissue]
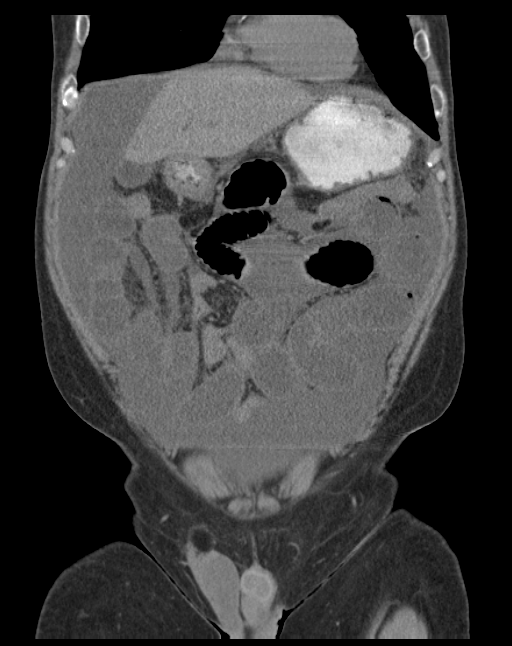
[im 75/169  soft-tissue]
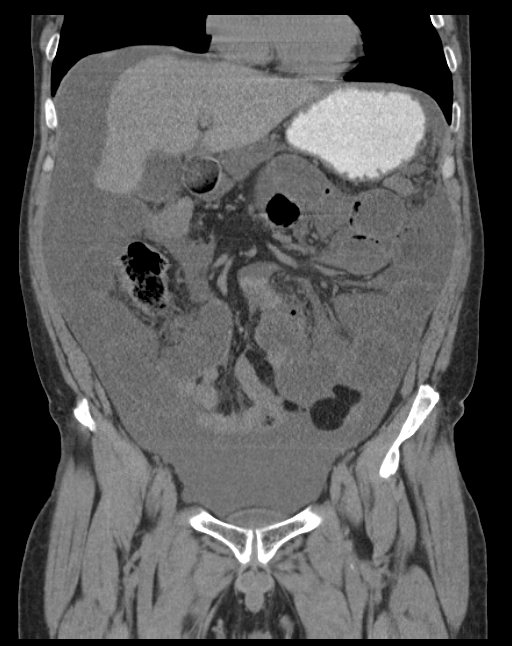
[im 94/169  soft-tissue]
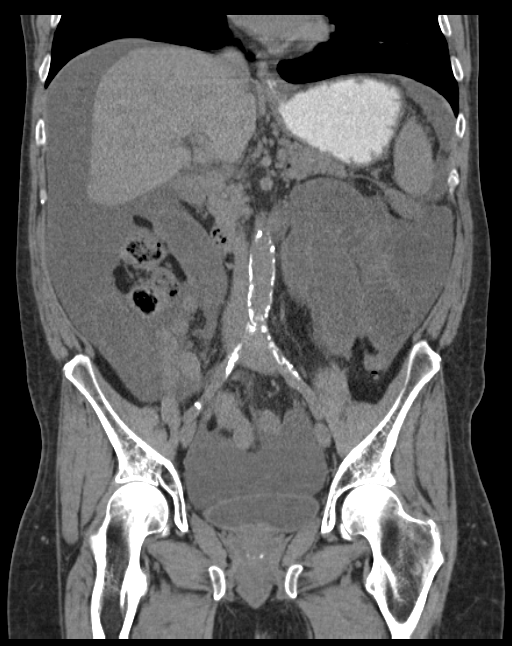

[16 of 46 positions shown; findings below may reference images not displayed]

FINDINGS: Lower chest: The included lung bases are clear. No pleural effusion.
Normal heart size.

Liver: Nodular contours consistent with cirrhosis. No evidence of
focal lesion allowing for lack contrast.

Hepatobiliary: Gallbladder physiologically distended, no calcified
stone. No biliary dilatation.

Pancreas: No ductal dilatation.  No peripancreatic inflammation.

Spleen: Upper normal in size.  Scattered granuloma.

Adrenal glands: No nodule.

Kidneys: No hydronephrosis or urolithiasis. Kidneys symmetric in
size. No perinephric stranding.

Stomach/Bowel: Stomach distended with ingested contrast. Small
amount contrast in the distal esophagus. Dilated fluid-filled small
bowel loops with transition point umbilical hernia. Exiting and
distal small bowel loops are decompressed. No pneumatosis. Small
volume stool throughout the colon. Distal colonic diverticulosis, no
evidence of diverticulitis. Appendix is normal.

Vascular/Lymphatic: No retroperitoneal adenopathy. Abdominal aorta
is normal in caliber. Moderate atherosclerosis without aneurysm.

Reproductive: Prostate gland normal in size.

Bladder: Physiologically distended.

Other: Moderate volume intra-abdominal and pelvic ascites. Minimal
ascites in the umbilical hernia. No free air. No definite loculated
fluid collection.

Musculoskeletal: There are no acute or suspicious osseous
abnormalities.
IMPRESSION: 1. Small-bowel obstruction secondary to incarcerated umbilical
hernia.
2. Cirrhosis with moderate volume intra-abdominal pelvic ascites.

## 2016-11-05 MED FILL — OMEPRAZOLE DR 20 MG CAPSULE: 20 | 30 days supply | Qty: 60 | Fill #0

## 2016-11-05 MED FILL — SPIRONOLACTONE 100 MG TAB: 100 | 30 days supply | Qty: 60 | Fill #4

## 2016-11-05 MED FILL — FUROSEMIDE 20 MG TABLET: 20 | 30 days supply | Qty: 120 | Fill #3

## 2016-11-06 MED FILL — NADOLOL 20 MG TABLET: 20 | 30 days supply | Qty: 30 | Fill #0

## 2016-11-08 ENCOUNTER — Ambulatory Visit: Payer: Medicaid Other | Attending: Family Medicine | Admitting: Physician Assistant

## 2016-11-08 VITALS — BP 90/58 | HR 76 | Temp 97.8°F | Resp 20 | Wt 207.0 lb

## 2016-11-08 DIAGNOSIS — K7460 Unspecified cirrhosis of liver: Secondary | ICD-10-CM | POA: Insufficient documentation

## 2016-11-08 DIAGNOSIS — L989 Disorder of the skin and subcutaneous tissue, unspecified: Secondary | ICD-10-CM | POA: Insufficient documentation

## 2016-11-08 DIAGNOSIS — Z79899 Other long term (current) drug therapy: Secondary | ICD-10-CM | POA: Diagnosis not present

## 2016-11-08 DIAGNOSIS — R188 Other ascites: Secondary | ICD-10-CM | POA: Diagnosis not present

## 2016-11-08 DIAGNOSIS — K439 Ventral hernia without obstruction or gangrene: Secondary | ICD-10-CM | POA: Diagnosis not present

## 2016-11-08 DIAGNOSIS — R0602 Shortness of breath: Secondary | ICD-10-CM | POA: Diagnosis not present

## 2016-11-08 DIAGNOSIS — H029 Unspecified disorder of eyelid: Secondary | ICD-10-CM

## 2016-11-08 MED ORDER — DOXYCYCLINE HYCLATE 100 MG PO TABS: 100.0000 mg | ORAL_TABLET | Freq: Two times a day (BID) | ORAL | 0 refills | Status: DC

## 2016-11-08 MED FILL — DOXYCYCLINE 100 MG TABLET: 100 | 10 days supply | Qty: 20 | Fill #0

## 2016-11-08 NOTE — Progress Notes (Signed)
Follow left eye nodule. Tried cold compress.

## 2016-11-08 NOTE — Progress Notes (Signed)
Kevin Nicholson, is a 55 y.o. male  [id number]  [id number]  DOB - 1960/12/02  Subjective:  Chief Complaint and HPI: Kevin Nicholson is a 55 y.o. male here today for a lesion under his L eye that he first noticed about 1 week ago.  Yesterday it started oozing and draining.  He doesn't think he has had any bleeding.  He has never noticed having a mole in this area. Vision is not affected.  The area has been a little tender, but not really painful.   ROS:   Constitutional:  No f/c, No night sweats, No unexplained weight loss. EENT:  No vision changes, No blurry vision, No hearing changes. No mouth, throat, or ear problems.  Respiratory: No cough, No SOB Cardiac: No CP, no palpitations GI:  No abd pain, No N/V/D. GU: No Urinary s/sx Musculoskeletal: No joint pain Neuro: No headache, no dizziness, no motor weakness.  Skin: No rash Endocrine:  No polydipsia. No polyuria.  Psych: Denies SI/HI  No problems updated.  ALLERGIES: No Known Allergies  PAST MEDICAL HISTORY: Past Medical History:  Diagnosis Date  . Ascites   . Cirrhosis ([place])    varices  . Shortness of breath dyspnea    with stair climbing  . Ventral hernia     MEDICATIONS AT HOME: Prior to Admission medications   Medication Sig Start Date End Date Taking? Authorizing Provider  albuterol (PROVENTIL HFA;VENTOLIN HFA) 108 (90 Base) MCG/ACT inhaler Inhale 2 puffs into the lungs every 6 (six) hours as needed for wheezing or shortness of breath. 10/31/16  Yes Josalyn Funches, MD  cetirizine (ZYRTEC) 10 MG tablet Take 1 tablet (10 mg total) by mouth daily. 08/23/15  Yes Josalyn Funches, MD  dicyclomine (BENTYL) 20 MG tablet Take 1 tablet (20 mg total) by mouth every 6 (six) hours. Prn abdominal cramping 07/26/16  Yes Dionne Bucy Chimere Klingensmith, PA-C  fluticasone (FLONASE) 50 MCG/ACT nasal spray PLACE 2 SPRAYS INTO BOTH NOSTRILS DAILY. 08/03/16  Yes Josalyn Funches, MD  furosemide (LASIX) 40 MG tablet Take 40 mg BID 06/27/16  Yes  Carlis Stable, NP  lactulose (CHRONULAC) 10 GM/15ML solution Take by mouth 2 (two) times daily.   Yes Historical Provider, MD  nadolol (CORGARD) 20 MG tablet TAKE 1 TABLET BY MOUTH DAILY. 11/06/16  Yes Annitta Needs, NP  omeprazole (PRILOSEC) 20 MG capsule TAKE 1 CAPSULE BY MOUTH TWICE DAILY 30 MINUTES BEFORE MEALS FOR 3 MONTHS AND THEN 1 CAPSULE DAILY. 11/02/16  Yes Carlis Stable, NP  spironolactone (ALDACTONE) 100 MG tablet Take 1 tablet (100 mg total) by mouth 2 (two) times daily. 06/27/16  Yes Carlis Stable, NP  traMADol (ULTRAM) 50 MG tablet Take by mouth 3 (three) times daily.   Yes Historical Provider, MD  doxycycline (VIBRA-TABS) 100 MG tablet Take 1 tablet (100 mg total) by mouth 2 (two) times daily. 11/08/16   Argentina Donovan, PA-C     Objective:  EXAM:   Vitals:   11/08/16 1049  BP: (!) 90/58  Pulse: 76  Resp: 20  Temp: 97.8 F (36.6 C)  TempSrc: Oral  SpO2: 100%  Weight: 207 lb (93.9 kg)  BP is about the same as it usu is for him  General appearance : A&OX3. NAD. Non-toxic-appearing HEENT: Atraumatic and Normocephalic.  PERRLA. EOM intact.  There is a 0.48mm flesh hyperpigmented and colored lesion with some scabbing under his L eye in the eyelid area.  Mild fluctuance underneath without induration.  No surrounding erythema.  Conjunctivae WNL.  TM clear B. Mouth-MMM, post pharynx WNL w/o erythema, No PND. Neck: supple, no JVD. No cervical lymphadenopathy. No thyromegaly Chest/Lungs:  Breathing-non-labored, Good air entry bilaterally, breath sounds normal without rales, rhonchi, or wheezing  CVS: S1 S2 regular, no murmurs, gallops, rubs  Extremities: Bilateral Lower Ext shows no edema, both legs are warm to touch with = pulse throughout Neurology:  CN II-XII grossly intact, Non focal.   Psych:  TP linear. J/I WNL. Normal speech. Appropriate eye contact and affect.  Skin:  No Rash  Data Review Lab Results  Component Value Date   HGBA1C 5.0 12/01/2015     Assessment & Plan     1. Lesion of lower eyelid Actinic keratosis vs R/O skin CA - Ambulatory referral to Dermatology Doxy to cover for any infection  Patient have been counseled extensively about nutrition and exercise  Return in about 2 months (around 01/09/2017), or if symptoms worsen or fail to improve, for Dr Adrian Blackwater.  The patient was given clear instructions to go to ER or return to medical center if symptoms don't improve, worsen or new problems develop. The patient verbalized understanding. The patient was told to call to get lab results if they haven't heard anything in the next week.     Freeman Caldron, PA-C [facility] and [facility] [place], [place]   11/08/2016, 11:20 AMPatient ID: SHANNA RUGAR, male   DOB: [birth year], 55 y.o.   MRN: [id number]

## 2016-12-10 MED FILL — SPIRONOLACTONE 100 MG TAB: 100 | 30 days supply | Qty: 60 | Fill #5

## 2016-12-10 MED FILL — NADOLOL 20 MG TABLET: 20 | 30 days supply | Qty: 30 | Fill #1

## 2016-12-10 MED FILL — FUROSEMIDE 20 MG TABLET: 20 | 30 days supply | Qty: 120 | Fill #4

## 2016-12-10 MED FILL — OMEPRAZOLE DR 20 MG CAPSULE: 20 | 30 days supply | Qty: 60 | Fill #1

## 2017-01-14 MED FILL — FUROSEMIDE 20 MG TABLET: 20 | 30 days supply | Qty: 120 | Fill #5

## 2017-01-14 MED FILL — SPIRONOLACTONE 100 MG TAB: 100 | 30 days supply | Qty: 60 | Fill #6

## 2017-01-14 MED FILL — NADOLOL 20 MG TABLET: 20 | 30 days supply | Qty: 30 | Fill #2

## 2017-01-14 MED FILL — OMEPRAZOLE DR 20 MG CAPSULE: 20 | 30 days supply | Qty: 60 | Fill #2

## 2017-02-05 ENCOUNTER — Ambulatory Visit (INDEPENDENT_AMBULATORY_CARE_PROVIDER_SITE_OTHER): Payer: Medicaid Other | Admitting: Gastroenterology

## 2017-02-05 ENCOUNTER — Encounter: Payer: Self-pay | Admitting: Gastroenterology

## 2017-02-05 VITALS — BP 97/57 | HR 57 | Temp 98.1°F | Ht 70.0 in | Wt 199.0 lb

## 2017-02-05 DIAGNOSIS — K7030 Alcoholic cirrhosis of liver without ascites: Secondary | ICD-10-CM | POA: Diagnosis not present

## 2017-02-05 NOTE — Progress Notes (Addendum)
REVIEWED-NO ADDITIONAL RECOMMENDATIONS.  Referring Provider: Boykin Nearing, MD Primary Care Physician:  Minerva Ends, MD Primary GI: Dr. Oneida Alar   Chief Complaint  Patient presents with  . Cirrhosis    HPI:   Kevin Nicholson is a 56 y.o. male presenting today with a history of likely ETOH cirrhosis, iron sats significantly elevated and ferritin elevated in past, with referral to hematology due to concern for iron overload. Likely a secondary effect in setting of ETOH cirrhosis. Hemochromatosis labs negative. Last EGD in Feb 2017 with 2 columns of Grade 1 varices, mild gastropathy, mild gastritis. Has established care with [facility] liver clinic for transplant evaluation. US abdomen up-to-date as of Nov 2017 and due again in May 2018. As per ASGE guidelines, he will need an EGD to screen for varices yearly due to history of ETOH induced cirrhosis.  Denies abdominal pain, no confusion. Taking Nadolol 20 mg once daily, as this is all he could tolerate. Remains on aldactone 100 BID and lasix 40 BID. No lower extremity edema or abdominal distension. No added salt.   Past Medical History:  Diagnosis Date  . Ascites   . Cirrhosis ([place])    varices  . Shortness of breath dyspnea    with stair climbing  . Ventral hernia     Past Surgical History:  Procedure Laterality Date  . BACK SURGERY  2000  . BIOPSY N/A 09/13/2015   Procedure: BIOPSY;  Surgeon: Danie Binder, MD;  Location: [facility];  Service: Endoscopy;  Laterality: N/A;  gastric,   . BIOPSY  01/03/2016   Procedure: BIOPSY;  Surgeon: Danie Binder, MD;  [facility];  Service: Endoscopy;;  gastric bx's  . COLONOSCOPY WITH PROPOFOL N/A 01/03/2016   Dr. Oneida Alar: 2 simple adenomas, 9 hyperplastic polyps, colonoscopy in 5 years   . ESOPHAGOGASTRODUODENOSCOPY (EGD) WITH PROPOFOL N/A 09/13/2015   Dr. 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 +H.pylori . Surveillance due in Jan 2017 for ulcer  healing  . ESOPHAGOGASTRODUODENOSCOPY (EGD) WITH PROPOFOL N/A 01/03/2016   Dr. Oneida Alar: 2 columns of Grade 1 varices, mild gastropathy, mild gastritis   . 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 POLYPECTOMY  01/03/2016   Procedure: POLYPECTOMY;  Surgeon: Danie Binder, MD;  [facility];  Service: Endoscopy;;  transverse colon polyps x2, sigmoid colon polyps x4, rectal polyps x5  . UMBILICAL HERNIA REPAIR N/A 03/11/2016   Procedure: laparoscopy, open HERNIA REPAIR UMBILICAL ADULT, small bowel resection ;  Surgeon: Leighton Ruff, MD;  Location: [facility];  Service: General;  Laterality: N/A;    Current Outpatient Prescriptions  Medication Sig Dispense Refill  . albuterol (PROVENTIL HFA;VENTOLIN HFA) 108 (90 Base) MCG/ACT inhaler Inhale 2 puffs into the lungs every 6 (six) hours as needed for wheezing or shortness of breath. 54 g 3  . cetirizine (ZYRTEC) 10 MG tablet Take 1 tablet (10 mg total) by mouth daily. 30 tablet 11  . dicyclomine (BENTYL) 20 MG tablet Take 1 tablet (20 mg total) by mouth every 6 (six) hours. Prn abdominal cramping 40 tablet 0  . doxycycline (VIBRA-TABS) 100 MG tablet Take 1 tablet (100 mg total) by mouth 2 (two) times daily. 20 tablet 0  . fluticasone (FLONASE) 50 MCG/ACT nasal spray PLACE 2 SPRAYS INTO BOTH NOSTRILS DAILY. 16 g 2  . furosemide (LASIX) 40 MG tablet Take 40 mg BID 60 tablet 5  .  lactulose (CHRONULAC) 10 GM/15ML solution Take by mouth 2 (two) times daily.    . nadolol (CORGARD) 20 MG tablet TAKE 1 TABLET BY MOUTH DAILY. 30 tablet 5  . omeprazole (PRILOSEC) 20 MG capsule TAKE 1 CAPSULE BY MOUTH TWICE DAILY 30 MINUTES BEFORE MEALS FOR 3 MONTHS AND THEN 1 CAPSULE DAILY. 60 capsule 11  . spironolactone (ALDACTONE) 100 MG tablet Take 1 tablet (100 mg total) by mouth 2 (two) times daily. 60 tablet 5  . traMADol (ULTRAM) 50 MG tablet Take by mouth 3 (three) times daily.     No current facility-administered medications  for this visit.     Allergies as of 02/05/2017  . (No Known Allergies)    Family History  Problem Relation Age of Onset  . Cancer Mother   . Cancer Father   . Colon cancer Neg Hx   . Liver disease Neg Hx     Social History   Social History  . Marital status: Single    Spouse name: N/A  . Number of children: N/A  . Years of education: N/A   Social History Main Topics  . Smoking status: Current Every Day Smoker    Packs/day: 1.00    Years: 45.00    Types: Cigarettes  . Smokeless tobacco: Never Used  . 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 Drug use: No  . Sexual activity: Not Asked   Other Topics Concern  . None   Social History Narrative  . None    Review of Systems: As mentioned in HPI   Physical Exam: BP (!) 97/57   Pulse (!) 57   Temp 98.1 F (36.7 C) (Oral)   Ht 5\' 10"  (1.778 m)   Wt 199 lb (90.3 kg)   BMI 28.55 kg/m  General:   Alert and oriented. No distress noted. Pleasant and cooperative.  Head:  Normocephalic and atraumatic. Eyes:  Conjuctiva clear without scleral icterus. Mouth:  Oral mucosa pink and moist.  Heart:  S1, S2 present without murmurs, rubs, or gallops. Regular rate and rhythm. Abdomen:  +BS, soft, non-tender and non-distended. No rebound or guarding. No HSM or masses noted. Msk:  Symmetrical without gross deformities. Normal posture. Extremities:  Without edema. Neurologic:  Alert and  oriented x4 Psych:  Alert and cooperative. Normal mood and affect.

## 2017-02-05 NOTE — Patient Instructions (Signed)
I am glad you are doing well!  We will see you in 6 months, and we will send a reminder for an ultrasound in May 2018.  I am requesting the labs from the [facility] to have on file.

## 2017-02-06 ENCOUNTER — Telehealth: Payer: Self-pay | Admitting: Gastroenterology

## 2017-02-06 NOTE — Telephone Encounter (Signed)
Please let patient know that after review of his chart, he actually does need an EGD with Propofol by Dr. Oneida Alar for variceal surveillance. Is he willing to pursue this now?

## 2017-02-06 NOTE — Assessment & Plan Note (Signed)
56 year old male with ETOH cirrhosis, followed by [facility] with last labs in Dec 2017. We are requesting. Remains on Nadolol 20 mg daily, lasix 40 BID and aldactone 100 BID. US abdomen due in  May 2018. He is also due for variceal surveillance due to history of ETOH cirrhosis, with last done in Feb 2017.   Proceed with upper endoscopy in the near future with Dr. Oneida Alar. The risks, benefits, and alternatives have been discussed in detail with patient. They have stated understanding and desire to proceed.  PROPOFOL  Return in 6 months

## 2017-02-08 NOTE — Progress Notes (Signed)
cc'ed to pcp °

## 2017-02-12 ENCOUNTER — Telehealth: Payer: Self-pay

## 2017-02-12 MED FILL — SPIRONOLACTONE 100 MG TAB: 100 | 30 days supply | Qty: 60 | Fill #0

## 2017-02-12 MED FILL — FUROSEMIDE 20 MG TABLET: 20 | 30 days supply | Qty: 120 | Fill #0

## 2017-02-12 MED FILL — FLUTICASONE PROP 50 MCG SPR: 50 | 30 days supply | Qty: 16 | Fill #1

## 2017-02-12 MED FILL — NADOLOL 20 MG TABLET: 20 | 30 days supply | Qty: 30 | Fill #3

## 2017-02-12 NOTE — Telephone Encounter (Signed)
PT is aware and OK to schedule. See separate triage.

## 2017-02-19 NOTE — Telephone Encounter (Signed)
Gastroenterology Pre-Procedure Review  Request Date: 02/12/2017 Requesting Physician:   PATIENT REVIEW QUESTIONS: The patient responded to the following health history questions as indicated:    1. Diabetes Melitis: no 2. Joint replacements in the past 12 months: no 3. Major health problems in the past 3 months: no 4. Has an artificial valve or MVP: no 5. Has a defibrillator: no 6. Has been advised in past to take antibiotics in advance of a procedure like teeth cleaning: no 7. Family history of colon cancer: no  8. Alcohol Use: no 9. History of sleep apnea: no  10. History of coronary artery or other vascular stents placed within the last 12 months: no    MEDICATIONS & ALLERGIES:    Patient reports the following regarding taking any blood thinners:   Plavix? no Aspirin? no Coumadin? no Brilinta? no Xarelto? no Eliquis? no Pradaxa? no Savaysa? no Effient? no  Patient confirms/reports the following medications:  Current Outpatient Prescriptions  Medication Sig Dispense Refill  . albuterol (PROVENTIL HFA;VENTOLIN HFA) 108 (90 Base) MCG/ACT inhaler Inhale 2 puffs into the lungs every 6 (six) hours as needed for wheezing or shortness of breath. 54 g 3  . cetirizine (ZYRTEC) 10 MG tablet Take 1 tablet (10 mg total) by mouth daily. 30 tablet 11  . fluticasone (FLONASE) 50 MCG/ACT nasal spray PLACE 2 SPRAYS INTO BOTH NOSTRILS DAILY. 16 g 2  . furosemide (LASIX) 40 MG tablet Take 40 mg BID 60 tablet 5  . lactulose (CHRONULAC) 10 GM/15ML solution Take by mouth 2 (two) times daily.    . nadolol (CORGARD) 20 MG tablet TAKE 1 TABLET BY MOUTH DAILY. 30 tablet 5  . omeprazole (PRILOSEC) 20 MG capsule TAKE 1 CAPSULE BY MOUTH TWICE DAILY 30 MINUTES BEFORE MEALS FOR 3 MONTHS AND THEN 1 CAPSULE DAILY. 60 capsule 11  . spironolactone (ALDACTONE) 100 MG tablet Take 1 tablet (100 mg total) by mouth 2 (two) times daily. 60 tablet 5  . dicyclomine (BENTYL) 20 MG tablet Take 1 tablet (20 mg total) by  mouth every 6 (six) hours. Prn abdominal cramping (Patient not taking: Reported on 02/12/2017) 40 tablet 0  . doxycycline (VIBRA-TABS) 100 MG tablet Take 1 tablet (100 mg total) by mouth 2 (two) times daily. (Patient not taking: Reported on 02/12/2017) 20 tablet 0  . traMADol (ULTRAM) 50 MG tablet Take by mouth 3 (three) times daily.     No current facility-administered medications for this visit.     Patient confirms/reports the following allergies:  No Known Allergies  No orders of the defined types were placed in this encounter.   AUTHORIZATION INFORMATION Primary Insurance:   ID #:  Group #:  Pre-Cert / Auth required: Pre-Cert / Auth #:   Secondary Insurance:  ID #:   Group #:  Pre-Cert / Auth required: Pre-Cert / Auth #:   SCHEDULE INFORMATION: Procedure has been scheduled as follows:  Date:   03/05/2017              Time:  9:45 Am Location: [facility] Short Stay  This Gastroenterology Pre-Precedure Review Form is being routed to the following provider(s) Dr. Oneida Alar

## 2017-02-22 NOTE — Telephone Encounter (Signed)
Appropriate.

## 2017-02-25 ENCOUNTER — Other Ambulatory Visit: Payer: Self-pay

## 2017-02-25 DIAGNOSIS — K7460 Unspecified cirrhosis of liver: Secondary | ICD-10-CM

## 2017-02-25 NOTE — Telephone Encounter (Signed)
PT has been scheduled for 03/05/2017 at 9:45 with Dr. Oneida Alar in [place].  Waiting on pre-op appt to let pt know.

## 2017-02-25 NOTE — Telephone Encounter (Signed)
Pt is aware of his pre-op appt on 03/01/2017 at 12:45 pm. Instructions have been mailed and he will call if he does not receive them by Thursday or Friday.

## 2017-02-26 NOTE — Patient Instructions (Signed)
Kevin Nicholson  02/26/2017     @PREFPERIOPPHARMACY @   Your procedure is scheduled on 03/05/2017.  Report to [name] at 8:15 A.M.  Call this number if you have problems the morning of surgery:  [phone]   Remember:  Do not eat food or drink liquids after midnight.  Take these medicines the morning of surgery with A SIP OF WATER : Zyrtec, Corgard, Prilosec.  Please use your albuterol inhaler before leaving home and bring it with you to the hospital.   Do not wear jewelry, make-up or nail polish.  Do not wear lotions, powders, or perfumes, or deoderant.  Do not shave 48 hours prior to surgery.  Men may shave face and neck.  Do not bring valuables to the hospital.  [facility] is not responsible for any belongings or valuables.  Contacts, dentures or bridgework may not be worn into surgery.  Leave your suitcase in the car.  After surgery it may be brought to your room.  For patients admitted to the hospital, discharge time will be determined by your treatment team.  Patients discharged the day of surgery will not be allowed to drive home.   Name and phone number of your driver:   family Special instructions:  n/a  Please read over the following fact sheets that you were given. Care and Recovery After Surgery           Esophagogastroduodenoscopy Esophagogastroduodenoscopy (EGD) is a procedure to examine the lining of the esophagus, stomach, and first part of the small intestine (duodenum). This procedure is done to check for problems such as inflammation, bleeding, ulcers, or growths. During this procedure, a long, flexible, lighted tube with a camera attached (endoscope) is inserted down the throat. Tell a health care provider about:  Any allergies you have.  All medicines you are taking, including vitamins, herbs, eye drops, creams, and over-the-counter medicines.  Any problems you or family members have had with anesthetic medicines.  Any blood disorders you  have.  Any surgeries you have had.  Any medical conditions you have.  Whether you are pregnant or may be pregnant. What are the risks? Generally, this is a safe procedure. However, problems may occur, including:  Infection.  Bleeding.  A tear (perforation) in the esophagus, stomach, or duodenum.  Trouble breathing.  Excessive sweating.  Spasms of the larynx.  A slowed heartbeat.  Low blood pressure. What happens before the procedure?  Follow instructions from your health care provider about eating or drinking restrictions.  Ask your health care provider about:  Changing or stopping your regular medicines. This is especially important if you are taking diabetes medicines or blood thinners.  Taking medicines such as aspirin and ibuprofen. These medicines can thin your blood. Do not take these medicines before your procedure if your health care provider instructs you not to.  Plan to have someone take you home after the procedure.  If you wear dentures, be ready to remove them before the procedure. What happens during the procedure?  To reduce your risk of infection, your health care team will wash or sanitize their hands.  An IV tube will be put in a vein in your hand or arm. You will get medicines and fluids through this tube.  You will be given one or more of the following:  A medicine to help you relax (sedative).  A medicine to numb the area (local anesthetic). This medicine may be sprayed into your throat. It will make you  feel more comfortable and keep you from gagging or coughing during the procedure.  A medicine for pain.  A mouth guard may be placed in your mouth to protect your teeth and to keep you from biting on the endoscope.  You will be asked to lie on your left side.  The endoscope will be lowered down your throat into your esophagus, stomach, and duodenum.  Air will be put into the endoscope. This will help your health care provider see  better.  The lining of your esophagus, stomach, and duodenum will be examined.  Your health care provider may:  Take a tissue sample so it can be looked at in a lab (biopsy).  Remove growths.  Remove objects (foreign bodies) that are stuck.  Treat any bleeding with medicines or other devices that stop tissue from bleeding.  Widen (dilate) or stretch narrowed areas of your esophagus and stomach.  The endoscope will be taken out. The procedure may vary among health care providers and hospitals. What happens after the procedure?  Your blood pressure, heart rate, breathing rate, and blood oxygen level will be monitored often until the medicines you were given have worn off.  Do not eat or drink anything until the numbing medicine has worn off and your gag reflex has returned. This information is not intended to replace advice given to you by your health care provider. Make sure you discuss any questions you have with your health care provider. Document Released: 03/08/2005 Document Revised: 04/12/2016 Document Reviewed: 09/29/2015 Elsevier Interactive Patient Education  2017 [employer].

## 2017-03-01 ENCOUNTER — Encounter (HOSPITAL_COMMUNITY): Payer: Self-pay

## 2017-03-01 ENCOUNTER — Encounter (HOSPITAL_COMMUNITY)
Admission: RE | Admit: 2017-03-01 | Discharge: 2017-03-01 | Disposition: A | Discharge status: Home / Self Care | Payer: Medicaid Other | Source: Ambulatory Visit | Attending: Gastroenterology | Admitting: Gastroenterology

## 2017-03-01 DIAGNOSIS — K7030 Alcoholic cirrhosis of liver without ascites: Secondary | ICD-10-CM | POA: Insufficient documentation

## 2017-03-01 DIAGNOSIS — Z01812 Encounter for preprocedural laboratory examination: Secondary | ICD-10-CM | POA: Insufficient documentation

## 2017-03-01 DIAGNOSIS — Z800 Family history of malignant neoplasm of digestive organs: Secondary | ICD-10-CM | POA: Diagnosis not present

## 2017-03-01 DIAGNOSIS — Z01810 Encounter for preprocedural cardiovascular examination: Secondary | ICD-10-CM | POA: Diagnosis not present

## 2017-03-01 DIAGNOSIS — Z9889 Other specified postprocedural states: Secondary | ICD-10-CM | POA: Diagnosis not present

## 2017-03-01 DIAGNOSIS — F17210 Nicotine dependence, cigarettes, uncomplicated: Secondary | ICD-10-CM | POA: Insufficient documentation

## 2017-03-01 DIAGNOSIS — R188 Other ascites: Secondary | ICD-10-CM | POA: Insufficient documentation

## 2017-03-01 DIAGNOSIS — Z79899 Other long term (current) drug therapy: Secondary | ICD-10-CM | POA: Insufficient documentation

## 2017-03-01 HISTORY — DX: Essential (primary) hypertension: I10

## 2017-03-01 LAB — CBC
HCT: 40.7 % (ref 39.0–52.0)
Hemoglobin: 14.2 g/dL (ref 13.0–17.0)
MCH: 34.3 pg — AB (ref 26.0–34.0)
MCHC: 34.9 g/dL (ref 30.0–36.0)
MCV: 98.3 fL (ref 78.0–100.0)
PLATELETS: 225 10*3/uL (ref 150–400)
RBC: 4.14 MIL/uL — ABNORMAL LOW (ref 4.22–5.81)
RDW: 13 % (ref 11.5–15.5)
WBC: 6.3 10*3/uL (ref 4.0–10.5)

## 2017-03-01 LAB — BASIC METABOLIC PANEL
Anion gap: 9 (ref 5–15)
BUN: 46 mg/dL — AB (ref 6–20)
CALCIUM: 9.3 mg/dL (ref 8.9–10.3)
CO2: 27 mmol/L (ref 22–32)
CREATININE: 1.62 mg/dL — AB (ref 0.61–1.24)
Chloride: 95 mmol/L — ABNORMAL LOW (ref 101–111)
GFR calc Af Amer: 53 mL/min — ABNORMAL LOW (ref >60)
GFR, EST NON AFRICAN AMERICAN: 46 mL/min — AB (ref >60)
GLUCOSE: 100 mg/dL — AB (ref 65–99)
Potassium: 4.5 mmol/L (ref 3.5–5.1)
Sodium: 131 mmol/L — ABNORMAL LOW (ref 135–145)

## 2017-03-05 ENCOUNTER — Encounter (HOSPITAL_COMMUNITY)
Admission: RE | Disposition: A | Discharge status: Home / Self Care | Payer: Self-pay | Source: Ambulatory Visit | Attending: Gastroenterology

## 2017-03-05 ENCOUNTER — Encounter (HOSPITAL_COMMUNITY): Payer: Self-pay | Admitting: *Deleted

## 2017-03-05 ENCOUNTER — Ambulatory Visit (HOSPITAL_COMMUNITY)
Admission: RE | Admit: 2017-03-05 | Discharge: 2017-03-05 | Disposition: A | Discharge status: Home / Self Care | Payer: Medicaid Other | Source: Ambulatory Visit | Attending: Gastroenterology | Admitting: Gastroenterology

## 2017-03-05 ENCOUNTER — Ambulatory Visit (HOSPITAL_COMMUNITY): Payer: Medicaid Other | Admitting: Anesthesiology

## 2017-03-05 DIAGNOSIS — K2970 Gastritis, unspecified, without bleeding: Secondary | ICD-10-CM | POA: Diagnosis not present

## 2017-03-05 DIAGNOSIS — I8500 Esophageal varices without bleeding: Secondary | ICD-10-CM | POA: Diagnosis not present

## 2017-03-05 DIAGNOSIS — Z09 Encounter for follow-up examination after completed treatment for conditions other than malignant neoplasm: Secondary | ICD-10-CM | POA: Insufficient documentation

## 2017-03-05 DIAGNOSIS — Z7951 Long term (current) use of inhaled steroids: Secondary | ICD-10-CM | POA: Diagnosis not present

## 2017-03-05 DIAGNOSIS — F1011 Alcohol abuse, in remission: Secondary | ICD-10-CM | POA: Insufficient documentation

## 2017-03-05 DIAGNOSIS — I10 Essential (primary) hypertension: Secondary | ICD-10-CM | POA: Insufficient documentation

## 2017-03-05 DIAGNOSIS — K766 Portal hypertension: Secondary | ICD-10-CM | POA: Diagnosis not present

## 2017-03-05 DIAGNOSIS — F17210 Nicotine dependence, cigarettes, uncomplicated: Secondary | ICD-10-CM | POA: Insufficient documentation

## 2017-03-05 DIAGNOSIS — Z8719 Personal history of other diseases of the digestive system: Secondary | ICD-10-CM | POA: Diagnosis not present

## 2017-03-05 DIAGNOSIS — Z79899 Other long term (current) drug therapy: Secondary | ICD-10-CM | POA: Diagnosis not present

## 2017-03-05 DIAGNOSIS — K3189 Other diseases of stomach and duodenum: Secondary | ICD-10-CM | POA: Insufficient documentation

## 2017-03-05 DIAGNOSIS — K7460 Unspecified cirrhosis of liver: Secondary | ICD-10-CM

## 2017-03-05 DIAGNOSIS — Z8711 Personal history of peptic ulcer disease: Secondary | ICD-10-CM | POA: Diagnosis not present

## 2017-03-05 HISTORY — PX: ESOPHAGOGASTRODUODENOSCOPY (EGD) WITH PROPOFOL: SHX5813

## 2017-03-05 SURGERY — ESOPHAGOGASTRODUODENOSCOPY (EGD) WITH PROPOFOL
Anesthesia: Monitor Anesthesia Care

## 2017-03-05 MED ORDER — MIDAZOLAM HCL 2 MG/2ML IJ SOLN
INTRAMUSCULAR | Status: AC
  Filled 2017-03-05: qty 2

## 2017-03-05 MED ORDER — GLYCOPYRROLATE 0.2 MG/ML IJ SOLN
INTRAMUSCULAR | Status: DC | PRN
  Administered 2017-03-05: 0.2 mg via INTRAVENOUS

## 2017-03-05 MED ORDER — MIDAZOLAM HCL 2 MG/2ML IJ SOLN
1.0000 mg | INTRAMUSCULAR | Status: AC
  Administered 2017-03-05: 2 mg via INTRAVENOUS

## 2017-03-05 MED ORDER — EPHEDRINE SULFATE 50 MG/ML IJ SOLN
INTRAMUSCULAR | Status: DC | PRN
  Administered 2017-03-05 (×2): 10 mg via INTRAVENOUS

## 2017-03-05 MED ORDER — LIDOCAINE VISCOUS 2 % MT SOLN
15.0000 mL | Freq: Once | OROMUCOSAL | Status: AC
  Administered 2017-03-05: 3 mL via OROMUCOSAL

## 2017-03-05 MED ORDER — CHLORHEXIDINE GLUCONATE CLOTH 2 % EX PADS: 6.0000 | MEDICATED_PAD | Freq: Once | CUTANEOUS | Status: DC

## 2017-03-05 MED ORDER — LACTATED RINGERS IV SOLN
INTRAVENOUS | Status: DC
  Administered 2017-03-05: 09:00:00 via INTRAVENOUS

## 2017-03-05 MED ORDER — FENTANYL CITRATE (PF) 100 MCG/2ML IJ SOLN
INTRAMUSCULAR | Status: AC
  Filled 2017-03-05: qty 2

## 2017-03-05 MED ORDER — PROPOFOL 10 MG/ML IV BOLUS
INTRAVENOUS | Status: AC
  Filled 2017-03-05: qty 20

## 2017-03-05 MED ORDER — LIDOCAINE HCL (PF) 1 % IJ SOLN
INTRAMUSCULAR | Status: AC
  Filled 2017-03-05: qty 5

## 2017-03-05 MED ORDER — FENTANYL CITRATE (PF) 100 MCG/2ML IJ SOLN
25.0000 ug | INTRAMUSCULAR | Status: AC
  Administered 2017-03-05: 25 ug via INTRAVENOUS

## 2017-03-05 MED ORDER — PROPOFOL 10 MG/ML IV BOLUS
INTRAVENOUS | Status: AC
  Filled 2017-03-05: qty 40

## 2017-03-05 MED ORDER — LIDOCAINE VISCOUS 2 % MT SOLN
OROMUCOSAL | Status: AC
  Filled 2017-03-05: qty 15

## 2017-03-05 MED ORDER — PROPOFOL 500 MG/50ML IV EMUL
INTRAVENOUS | Status: DC | PRN
  Administered 2017-03-05: 150 ug/kg/min via INTRAVENOUS

## 2017-03-05 NOTE — H&P (Signed)
Primary Care Physician:  Minerva Ends, MD Primary Gastroenterologist:  Dr. Oneida Alar  Pre-Procedure History & Physical: HPI:  Kevin Nicholson is a 56 y.o. male here for SCREENING VARICES.  Past Medical History:  Diagnosis Date  . Ascites   . 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 Hypertension   . Shortness of breath dyspnea    with stair climbing  . Ventral hernia     Past Surgical History:  Procedure Laterality Date  . BACK SURGERY  2000  . BIOPSY N/A 09/13/2015   Procedure: BIOPSY;  Surgeon: Danie Binder, MD;  Location: [facility] ORS;  Service: Endoscopy;  Laterality: N/A;  gastric,   . BIOPSY  01/03/2016   Procedure: BIOPSY;  Surgeon: Danie Binder, MD;  Location: [facility];  Service: Endoscopy;;  gastric bx's  . COLONOSCOPY WITH PROPOFOL N/A 01/03/2016   Dr. Oneida Alar: 2 simple adenomas, 9 hyperplastic polyps, colonoscopy in 5 years   . ESOPHAGOGASTRODUODENOSCOPY (EGD) WITH PROPOFOL N/A 09/13/2015   Dr. 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 +H.pylori . Surveillance due in Jan 2017 for ulcer healing  . ESOPHAGOGASTRODUODENOSCOPY (EGD) WITH PROPOFOL N/A 01/03/2016   Dr. Oneida Alar: 2 columns of Grade 1 varices, mild gastropathy, mild gastritis   . 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 POLYPECTOMY  01/03/2016   Procedure: POLYPECTOMY;  Surgeon: Danie Binder, MD;  Location: [facility];  Service: Endoscopy;;  transverse colon polyps x2, sigmoid colon polyps x4, rectal polyps x5  . UMBILICAL HERNIA REPAIR N/A 03/11/2016   Procedure: laparoscopy, open HERNIA REPAIR UMBILICAL ADULT, small bowel resection ;  Surgeon: Leighton Ruff, MD;  Location: [facility];  Service: General;  Laterality: N/A;    Prior to Admission medications   Medication Sig Start Date End Date Taking? Authorizing Provider  albuterol (PROVENTIL HFA;VENTOLIN HFA) 108 (90 Base) MCG/ACT inhaler Inhale 2 puffs into the lungs every 6 (six) hours as needed for  wheezing or shortness of breath. 10/31/16  Yes Josalyn Funches, MD  cetirizine (ZYRTEC) 10 MG tablet Take 1 tablet (10 mg total) by mouth daily. 08/23/15  Yes Josalyn Funches, MD  fluticasone (FLONASE) 50 MCG/ACT nasal spray PLACE 2 SPRAYS INTO BOTH NOSTRILS DAILY. 08/03/16  Yes Josalyn Funches, MD  furosemide (LASIX) 20 MG tablet Take 40 mg by mouth 2 (two) times daily. Takes 2 tablets 01/14/17  Yes Historical Provider, MD  nadolol (CORGARD) 20 MG tablet TAKE 1 TABLET BY MOUTH DAILY. 11/06/16  Yes Annitta Needs, NP  omeprazole (PRILOSEC) 20 MG capsule TAKE 1 CAPSULE BY MOUTH TWICE DAILY 30 MINUTES BEFORE MEALS FOR 3 MONTHS AND THEN 1 CAPSULE DAILY. Patient taking differently: TAKE 1 CAPSULE BY MOUTH DAILY 30 MINUTES BEFORE MEALS 11/02/16  Yes Carlis Stable, NP  spironolactone (ALDACTONE) 100 MG tablet Take 1 tablet (100 mg total) by mouth 2 (two) times daily. 06/27/16  Yes Carlis Stable, NP  tetrahydrozoline 0.05 % ophthalmic solution Place 2 drops into both eyes every evening.   Yes Historical Provider, MD  dicyclomine (BENTYL) 20 MG tablet Take 1 tablet (20 mg total) by mouth every 6 (six) hours. Prn abdominal cramping Patient not taking: Reported on 02/12/2017 07/26/16   Argentina Donovan, PA-C  doxycycline (VIBRA-TABS) 100 MG tablet Take 1 tablet (100 mg total) by mouth 2 (two) times daily. Patient not taking: Reported on 02/12/2017 11/08/16   Argentina Donovan, PA-C  furosemide (LASIX) 40 MG tablet Take 40 mg BID Patient not taking: Reported on 02/26/2017 06/27/16   Carlis Stable, NP    Allergies as of 02/25/2017  . (No Known Allergies)    Family History  Problem Relation Age of Onset  . Cancer Mother   . Cancer Father   . Colon cancer Neg Hx   . Liver disease Neg Hx     Social History   Social History  . Marital status: Single    Spouse name: N/A  . Number of children: N/A  . Years of education: N/A   Occupational History  . Not on file.   Social History Main Topics  . Smoking status:  Current Every Day Smoker    Packs/day: 1.00    Years: 45.00    Types: Cigarettes  . Smokeless tobacco: Never Used  . 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 Drug use: No  . Sexual activity: Not on file   Other Topics Concern  . Not on file   Social History Narrative  . No narrative on file    Review of Systems: See HPI, otherwise negative ROS   Physical Exam: Temp 97.5 F (36.4 C) (Oral)   Ht 5\' 10"  (1.778 m)   Wt 199 lb (90.3 kg)   BMI 28.55 kg/m  General:   Alert,  pleasant and cooperative in NAD Head:  Normocephalic and atraumatic. Neck:  Supple; Lungs:  Clear throughout to auscultation.    Heart:  Regular rate and rhythm. Abdomen:  Soft, nontender and nondistended. Normal bowel sounds, without guarding, and without rebound.   Neurologic:  Alert and  oriented x4;  grossly normal neurologically.  Impression/Plan:     SCREENING VARICES  PLAN:  1.EGD TODAY. DISCUSSED PROCEDURE, BENEFITS, & RISKS: < 1% chance of medication reaction, bleeding, OR perforation.

## 2017-03-05 NOTE — Transfer of Care (Signed)
Immediate Anesthesia Transfer of Care Note  Patient: Kevin Nicholson  Procedure(s) Performed: Procedure(s) with comments: ESOPHAGOGASTRODUODENOSCOPY (EGD) WITH PROPOFOL (N/A) - 9:45 AM  Patient Location: PACU  Anesthesia Type:MAC  Level of Consciousness: awake, alert , oriented and patient cooperative  Airway & Oxygen Therapy: Patient Spontanous Breathing and Patient connected to nasal cannula oxygen  Post-op Assessment: Report given to RN and Post -op Vital signs reviewed and stable  Post vital signs: Reviewed and stable  Last Vitals:  Vitals:   03/05/17 0940 03/05/17 0945  BP: (!) 87/47 (!) 83/49  Resp: 20 (!) 28  Temp:      Last Pain:  Vitals:   03/05/17 0847  TempSrc: Oral      Patients Stated Pain Goal: 8 (94/85/46 2703)  Complications: No apparent anesthesia complications

## 2017-03-05 NOTE — Anesthesia Procedure Notes (Signed)
Procedure Name: MAC Date/Time: 03/05/2017 10:49 AM Performed by: Andree Elk, AMY A Pre-anesthesia Checklist: Patient identified, Emergency Drugs available, Suction available, Patient being monitored and Timeout performed Oxygen Delivery Method: Simple face mask

## 2017-03-05 NOTE — Addendum Note (Signed)
Addendum  created 03/05/17 1048 by Mickel Baas, CRNA   Anesthesia Intra Meds edited, Anesthesia Staff edited

## 2017-03-05 NOTE — Op Note (Addendum)
[facility] Patient Name: Kevin Nicholson Procedure Date: 03/05/2017 9:41 AM MRN: [id number] Date of Birth: 01/26/1961 Attending MD: Barney Drain , MD CSN: [id number] Age: 57 Admit Type: Outpatient Procedure:                Upper GI endoscopy,SCREENING Indications:              Follow-up of esophageal varices. PT HAS ABSTAINED                            FROM ETOH FOR > 1 YEAR. Providers:                Barney Drain, MD, Jeanann Lewandowsky. Sharon Seller, RN, Rosina Lowenstein, RN Referring MD:             Boykin Nearing MD Medicines:                Propofol per Anesthesia Complications:            No immediate complications. Estimated Blood Loss:     Estimated blood loss: none. Procedure:                Pre-Anesthesia Assessment:                           - Prior to the procedure, a History and Physical                            was performed, and patient medications and                            allergies were reviewed. The patient's tolerance of                            previous anesthesia was also reviewed. The risks                            and benefits of the procedure and the sedation                            options and risks were discussed with the patient.                            All questions were answered, and informed consent                            was obtained. Prior Anticoagulants: The patient has                            taken no previous anticoagulant or antiplatelet                            agents. ASA Grade Assessment: II - A patient with  mild systemic disease. After reviewing the risks                            and benefits, the patient was deemed in                            satisfactory condition to undergo the procedure.                            After obtaining informed consent, the endoscope was                            passed under direct vision. Throughout the   procedure, the patient's blood pressure, pulse, and                            oxygen saturations were monitored continuously. The                            [id number] ([id number]) was introduced through the                            mouth, and advanced to the second part of duodenum.                            The upper GI endoscopy was accomplished without                            difficulty. The patient tolerated the procedure                            well. Scope In: 10:04:10 AM Scope Out: 13:08:65 AM Total Procedure Duration: 0 hours 4 minutes 8 seconds  Findings:      Mild portal hypertensive gastropathy was found in the gastric fundus and       in the gastric body.      Patchy mild inflammation characterized by congestion (edema) and       erythema was found in the gastric antrum. NO GASTRIC VARICES.      The examined duodenum was normal. NO DUODENAL VARICES.      No to small varices were found in the distal esophagus. May have had 2       COLUMNS Grade II. Impression:               - MILD Portal hypertensive gastropathy.                           - MILD Gastritis.                           - No to small Grade I esophageal varices.                           - Moderate Sedation:      Per Anesthesia Care Recommendation:           - Resume previous diet.                           -  Continue present medications.                           - Repeat upper endoscopy in 2 years for                            surveillance following ALGORHITHM HEPATOLOGY                            2017-NO VARICES. PT IS CHILD PUGH A. NO NEED TO                            CONTINUE NADOLOL-NO BENEFIT. WILL TAPER OFF.                           - Return to GI office in 6 months.                           - Patient has a contact number available for                            emergencies. The signs and symptoms of potential                            delayed complications were discussed with the                             patient. Return to normal activities tomorrow.                            Written discharge instructions were provided to the                            patient. Procedure Code(s):        --- Professional ---                           [phone], Esophagogastroduodenoscopy, flexible,                            transoral; diagnostic, including collection of                            specimen(s) by brushing or washing, when performed                            (separate procedure) Diagnosis Code(s):        --- Professional ---                           K76.6, Portal hypertension                           K31.89, Other diseases of stomach and duodenum  K29.70, Gastritis, unspecified, without bleeding                           I85.00, Esophageal varices without bleeding CPT copyright 2016 American Medical Association. All rights reserved. The codes documented in this report are preliminary and upon coder review may  be revised to meet current compliance requirements. Barney Drain, MD Barney Drain, MD 03/05/2017 10:33:03 AM This report has been signed electronically. Number of Addenda: 0

## 2017-03-05 NOTE — Anesthesia Postprocedure Evaluation (Signed)
Anesthesia Post Note  Patient: Kevin Nicholson  Procedure(s) Performed: Procedure(s) (LRB): ESOPHAGOGASTRODUODENOSCOPY (EGD) WITH PROPOFOL (N/A)  Patient location during evaluation: PACU Anesthesia Type: MAC Level of consciousness: awake and alert and oriented Pain management: pain level controlled Vital Signs Assessment: post-procedure vital signs reviewed and stable Respiratory status: respiratory function stable and patient connected to nasal cannula oxygen Cardiovascular status: stable Postop Assessment: no signs of nausea or vomiting Anesthetic complications: no     Last Vitals:  Vitals:   03/05/17 0940 03/05/17 0945  BP: (!) 87/47 (!) 83/49  Resp: 20 (!) 28  Temp:      Last Pain:  Vitals:   03/05/17 0847  TempSrc: Oral                 Doyl Bitting A

## 2017-03-05 NOTE — Discharge Instructions (Signed)
You have esophageal varices DUE TO SCARRING IN YOUR LIVER. YOUR LIVER IS BETTER SINCE YOU STOPPED DRINKING. YOU DO NOT NEED FLUID PILLS AND YOU CAN TAPER OFF THE NADOLOL.    YOU CAN STOP SPIRONOLACTONE AND FUROSEMIDE. PLEASE CALL IF YOU RETAIN FLUID.  CHANGE NADOLOL TO EVERY OTHER DAY. NEXT DOSE: APR 19, 21, AND 23 THEN ONCE EVERY 3RD DAY: APR 26, 29 AND THEN STOP.  FOLLOW UP IN OCTOBER 2018.  REPEAT EGD IN 2 YEARS UNLESS YOU START DRINKING AGAIN.    UPPER ENDOSCOPY AFTER CARE Read the instructions outlined below and refer to this sheet in the next week. These discharge instructions provide you with general information on caring for yourself after you leave the hospital. While your treatment has been planned according to the most current medical practices available, unavoidable complications occasionally occur. If you have any problems or questions after discharge, call DR. Mariabelen Pressly, [phone].  ACTIVITY  You may resume your regular activity, but move at a slower pace for the next 24 hours.   Take frequent rest periods for the next 24 hours.   Walking will help get rid of the air and reduce the bloated feeling in your belly (abdomen).   No driving for 24 hours (because of the medicine (anesthesia) used during the test).   You may shower.   Do not sign any important legal documents or operate any machinery for 24 hours (because of the anesthesia used during the test).    NUTRITION  Drink plenty of fluids.   You may resume your normal diet as instructed by your doctor.   Begin with a light meal and progress to your normal diet. Heavy or fried foods are harder to digest and may make you feel sick to your stomach (nauseated).   Avoid alcoholic beverages for 24 hours or as instructed.    MEDICATIONS  You may resume your normal medications.   WHAT YOU CAN EXPECT TODAY  Some feelings of bloating in the abdomen.   Passage of more gas than usual.    IF YOU HAD A BIOPSY  TAKEN DURING THE UPPER ENDOSCOPY:  Eat a soft diet IF YOU HAVE NAUSEA, BLOATING, ABDOMINAL PAIN, OR VOMITING.    FINDING OUT THE RESULTS OF YOUR TEST Not all test results are available during your visit. DR. Oneida Alar WILL CALL YOU WITHIN 7 DAYS OF YOUR PROCEDUE WITH YOUR RESULTS. Do not assume everything is normal if you have not heard from DR. Ricci Dirocco IN ONE WEEK, CALL HER OFFICE AT [phone].  SEEK IMMEDIATE MEDICAL ATTENTION AND CALL THE OFFICE: [phone] IF:  You have more than a spotting of blood in your stool.   Your belly is swollen (abdominal distention).   You are nauseated or vomiting.   You have a temperature over 101F.   You have abdominal pain or discomfort that is severe or gets worse throughout the day.

## 2017-03-05 NOTE — Anesthesia Preprocedure Evaluation (Signed)
Anesthesia Evaluation  Patient identified by MRN, date of birth, ID band Patient awake    Reviewed: Allergy & Precautions, NPO status , Patient's Chart, lab work & pertinent test results  Airway Mallampati: I  TM Distance: >3 FB     Dental  (+) Partial Lower   Pulmonary shortness of breath and with exertion, Current Smoker,    breath sounds clear to auscultation       Cardiovascular hypertension,  Rhythm:Regular Rate:Normal     Neuro/Psych    GI/Hepatic PUD, (+) Cirrhosis   Esophageal Varices and ascites  substance abuse (quit etoh 1 yr ago)  alcohol use,   Endo/Other    Renal/GU      Musculoskeletal   Abdominal   Peds  Hematology   Anesthesia Other Findings   Reproductive/Obstetrics                             Anesthesia Physical Anesthesia Plan  ASA: III  Anesthesia Plan: MAC   Post-op Pain Management:    Induction: Intravenous  Airway Management Planned: Simple Face Mask  Additional Equipment:   Intra-op Plan:   Post-operative Plan:   Informed Consent: I have reviewed the patients History and Physical, chart, labs and discussed the procedure including the risks, benefits and alternatives for the proposed anesthesia with the patient or authorized representative who has indicated his/her understanding and acceptance.     Plan Discussed with:   Anesthesia Plan Comments:         Anesthesia Quick Evaluation

## 2017-03-11 ENCOUNTER — Encounter (HOSPITAL_COMMUNITY): Payer: Self-pay | Admitting: Gastroenterology

## 2017-03-19 MED FILL — FUROSEMIDE 20 MG TABLET: 20 | 30 days supply | Qty: 120 | Fill #1

## 2017-03-19 MED FILL — NADOLOL 20 MG TABLET: 20 | 30 days supply | Qty: 30 | Fill #4

## 2017-03-19 MED FILL — SPIRONOLACTONE 100 MG TAB: 100 | 30 days supply | Qty: 60 | Fill #1

## 2017-04-03 ENCOUNTER — Encounter: Payer: Self-pay | Admitting: Family Medicine

## 2017-04-08 ENCOUNTER — Other Ambulatory Visit: Payer: Self-pay | Admitting: Nurse Practitioner

## 2017-04-08 DIAGNOSIS — K7030 Alcoholic cirrhosis of liver without ascites: Secondary | ICD-10-CM

## 2017-04-22 ENCOUNTER — Ambulatory Visit
Admission: RE | Admit: 2017-04-22 | Discharge: 2017-04-22 | Disposition: A | Discharge status: Home / Self Care | Payer: Medicaid Other | Source: Ambulatory Visit | Attending: Nurse Practitioner | Admitting: Nurse Practitioner

## 2017-04-22 DIAGNOSIS — K7030 Alcoholic cirrhosis of liver without ascites: Secondary | ICD-10-CM

## 2017-04-22 MED FILL — FLUTICASONE PROP 50 MCG SPR: 50 | 30 days supply | Qty: 16 | Fill #2

## 2017-04-22 MED FILL — NADOLOL 20 MG TAB: 20 | 30 days supply | Qty: 30 | Fill #5

## 2017-04-22 MED FILL — OMEPRAZOLE DR 20 MG CAPSULE: 20 | 30 days supply | Qty: 60 | Fill #3

## 2017-04-30 ENCOUNTER — Encounter: Payer: Self-pay | Admitting: Gastroenterology

## 2017-06-03 ENCOUNTER — Other Ambulatory Visit: Payer: Self-pay | Admitting: Gastroenterology

## 2017-06-03 DIAGNOSIS — K7460 Unspecified cirrhosis of liver: Secondary | ICD-10-CM

## 2017-06-03 MED FILL — FUROSEMIDE 20 MG TABLET: 20 | 30 days supply | Qty: 30 | Fill #0

## 2017-06-03 MED FILL — SPIRONOLACTONE 100 MG TAB: 100 | 60 days supply | Qty: 60 | Fill #0

## 2017-06-04 MED FILL — NADOLOL 20 MG TAB: 20 | 30 days supply | Qty: 30 | Fill #0

## 2017-06-11 ENCOUNTER — Telehealth: Payer: Self-pay

## 2017-06-11 NOTE — Telephone Encounter (Signed)
PT said he knew the Nadolol was cut back some. I told him Dr. Oneida Alar put him on a taper when he had procedure in April. He said he has been taking it some, but he is not at home and he will have to call me back later. He did not realize that he should be off by now.

## 2017-06-11 NOTE — Telephone Encounter (Signed)
Received a PA request from the pts pharmacy, [facility], for Nadolol. I asked Vicente Males about this and she reviewed the pts chart and per [facility] discharge paperwork from the pts procedure, pt does not need to be on Nadolol anymore. [facility] gave him taper instructions.  I have faxed a note back to the pharmacy to D/C this medication.

## 2017-06-11 NOTE — Telephone Encounter (Signed)
REVIEWED. AGREE. NO ADDITIONAL RECOMMENDATIONS. 

## 2017-06-13 NOTE — Telephone Encounter (Signed)
PT is aware.

## 2017-06-13 NOTE — Telephone Encounter (Signed)
LMOM to call.

## 2017-06-13 NOTE — Telephone Encounter (Signed)
PT said he never did taper the Nadalol. He has been taking 20 mg daily. Does he need to taper now?

## 2017-06-13 NOTE — Telephone Encounter (Signed)
CHANGE NADOLOL TO EVERY OTHER DAY. NEXT DOSE: JUL 28, JUL 30 AUG 1 AND THEN ONCE EVERY 3RD DAY: AUG 4, AND 7 AND THEN STOP.

## 2017-08-08 ENCOUNTER — Ambulatory Visit: Payer: Medicaid Other | Admitting: Gastroenterology

## 2017-08-19 ENCOUNTER — Ambulatory Visit: Payer: Medicaid Other | Admitting: Gastroenterology

## 2017-08-20 ENCOUNTER — Ambulatory Visit: Payer: Medicaid Other | Admitting: Gastroenterology

## 2017-08-20 ENCOUNTER — Telehealth: Payer: Self-pay | Admitting: Gastroenterology

## 2017-08-20 ENCOUNTER — Encounter: Payer: Self-pay | Admitting: Gastroenterology

## 2017-08-20 NOTE — Telephone Encounter (Signed)
Patient was a no show and letter sent  °

## 2019-01-30 ENCOUNTER — Emergency Department (HOSPITAL_COMMUNITY)
Admission: EM | Admit: 2019-01-30 | Discharge: 2019-01-31 | Disposition: A | Discharge status: Other Acute Inpatient Hospital | Payer: Medicare Other | Attending: Emergency Medicine | Admitting: Emergency Medicine

## 2019-01-30 ENCOUNTER — Other Ambulatory Visit: Payer: Self-pay

## 2019-01-30 ENCOUNTER — Encounter (HOSPITAL_COMMUNITY): Payer: Self-pay | Admitting: *Deleted

## 2019-01-30 DIAGNOSIS — F17210 Nicotine dependence, cigarettes, uncomplicated: Secondary | ICD-10-CM | POA: Insufficient documentation

## 2019-01-30 DIAGNOSIS — F10229 Alcohol dependence with intoxication, unspecified: Secondary | ICD-10-CM

## 2019-01-30 DIAGNOSIS — F1010 Alcohol abuse, uncomplicated: Secondary | ICD-10-CM | POA: Insufficient documentation

## 2019-01-30 DIAGNOSIS — I10 Essential (primary) hypertension: Secondary | ICD-10-CM | POA: Insufficient documentation

## 2019-01-30 DIAGNOSIS — F1994 Other psychoactive substance use, unspecified with psychoactive substance-induced mood disorder: Secondary | ICD-10-CM | POA: Insufficient documentation

## 2019-01-30 DIAGNOSIS — F1020 Alcohol dependence, uncomplicated: Secondary | ICD-10-CM

## 2019-01-30 DIAGNOSIS — F322 Major depressive disorder, single episode, severe without psychotic features: Secondary | ICD-10-CM

## 2019-01-30 DIAGNOSIS — Z79899 Other long term (current) drug therapy: Secondary | ICD-10-CM | POA: Insufficient documentation

## 2019-01-30 LAB — CBC
HCT: 44.3 % (ref 39.0–52.0)
Hemoglobin: 15.2 g/dL (ref 13.0–17.0)
MCH: 33.6 pg (ref 26.0–34.0)
MCHC: 34.3 g/dL (ref 30.0–36.0)
MCV: 97.8 fL (ref 80.0–100.0)
NRBC: 0 % (ref 0.0–0.2)
PLATELETS: 166 10*3/uL (ref 150–400)
RBC: 4.53 MIL/uL (ref 4.22–5.81)
RDW: 12.3 % (ref 11.5–15.5)
WBC: 8.1 10*3/uL (ref 4.0–10.5)

## 2019-01-30 LAB — COMPREHENSIVE METABOLIC PANEL
ALT: 38 U/L (ref 0–44)
ANION GAP: 12 (ref 5–15)
AST: 54 U/L — ABNORMAL HIGH (ref 15–41)
Albumin: 4.5 g/dL (ref 3.5–5.0)
Alkaline Phosphatase: 88 U/L (ref 38–126)
BUN: 21 mg/dL — ABNORMAL HIGH (ref 6–20)
CALCIUM: 8.6 mg/dL — AB (ref 8.9–10.3)
CHLORIDE: 100 mmol/L (ref 98–111)
CO2: 23 mmol/L (ref 22–32)
CREATININE: 1.01 mg/dL (ref 0.61–1.24)
Glucose, Bld: 96 mg/dL (ref 70–99)
Potassium: 3.8 mmol/L (ref 3.5–5.1)
Sodium: 135 mmol/L (ref 135–145)
Total Bilirubin: 1.1 mg/dL (ref 0.3–1.2)
Total Protein: 7.6 g/dL (ref 6.5–8.1)

## 2019-01-30 LAB — ACETAMINOPHEN LEVEL

## 2019-01-30 LAB — ETHANOL: ALCOHOL ETHYL (B): 309 mg/dL — AB (ref <10)

## 2019-01-30 LAB — SALICYLATE LEVEL

## 2019-01-30 MED ORDER — LORAZEPAM 1 MG PO TABS: 0.0000 mg | ORAL_TABLET | Freq: Two times a day (BID) | ORAL | Status: DC

## 2019-01-30 MED ORDER — LORAZEPAM 2 MG/ML IJ SOLN: 0.0000 mg | Freq: Four times a day (QID) | INTRAMUSCULAR | Status: DC

## 2019-01-30 MED ORDER — LORAZEPAM 1 MG PO TABS
0.0000 mg | ORAL_TABLET | Freq: Four times a day (QID) | ORAL | Status: DC
  Administered 2019-01-31: 2 mg via ORAL
  Filled 2019-01-30: qty 2

## 2019-01-30 MED ORDER — LORAZEPAM 2 MG/ML IJ SOLN: 0.0000 mg | Freq: Two times a day (BID) | INTRAMUSCULAR | Status: DC

## 2019-01-30 MED ORDER — VITAMIN B-1 100 MG PO TABS
100.0000 mg | ORAL_TABLET | Freq: Every day | ORAL | Status: DC
  Administered 2019-01-31: 100 mg via ORAL
  Filled 2019-01-30: qty 1

## 2019-01-30 MED ORDER — THIAMINE HCL 100 MG/ML IJ SOLN: 100.0000 mg | Freq: Every day | INTRAMUSCULAR | Status: DC

## 2019-01-30 NOTE — Progress Notes (Signed)
TTS calling telepsych cart, not ringing.

## 2019-01-30 NOTE — ED Provider Notes (Signed)
[facility] DEPT Provider Note   CSN: [id number] Arrival date & time: 01/30/19  2013    History   Chief Complaint Chief Complaint  Patient presents with  . IVC     HPI Kevin Nicholson is a 58 y.o. male.     HPI Patient brought in under IVC.  Reportedly had made some suicidal statements.  Patient states that earlier this week he was suicidal.  Was at [facility] on Monday.  States it kept him for 15 hours and then cleared him.  States he was upset over recent break-up.  States he is not suicidal now.  Does drink alcohol heavily.  States he drinks around 1/5 a day.  Had been drinking today.  Also smokes cigarettes.  States he does have guns at home but is not suicidal. Past Medical History:  Diagnosis Date  . Ascites   . Cirrhosis ([place])    varices  . Hypertension   . Shortness of breath dyspnea    with stair climbing  . Ventral hernia     Patient Active Problem List   Diagnosis Date Noted  . Hypotension- SBP in 90s is baselin 03/21/2016  . Malnutrition of moderate degree 03/13/2016  . Incarcerated umbilical hernia 50/35/4656  . SBO (small bowel obstruction) ([place]) 03/10/2016  . Alcoholic cirrhosis of liver with ascites ([place]) 03/01/2016  . PUD (peptic ulcer disease) 12/14/2015  . Encounter for screening colonoscopy 12/14/2015  . Esophageal varices determined by endoscopy ([place]) 09/23/2015  . Current smoker 08/09/2015  . Sinus pressure 08/09/2015  . SOB (shortness of breath) 08/09/2015  . Vaccine for viral hepatitis 08/09/2015  . Ascites 06/28/2015  . Hepatic cirrhosis ([place]) 06/28/2015    Past Surgical History:  Procedure Laterality Date  . BACK SURGERY  2000  . BIOPSY N/A 09/13/2015   Procedure: BIOPSY;  Surgeon: Danie Binder, MD;  Location: [facility] ORS;  Service: Endoscopy;  Laterality: N/A;  gastric,   . BIOPSY  01/03/2016   Procedure: BIOPSY;  Surgeon: Danie Binder, MD;  Location: [facility];  Service: Endoscopy;;  gastric  bx's  . COLONOSCOPY WITH PROPOFOL N/A 01/03/2016   Dr. Oneida Alar: 2 simple adenomas, 9 hyperplastic polyps, colonoscopy in 5 years   . ESOPHAGOGASTRODUODENOSCOPY (EGD) WITH PROPOFOL N/A 09/13/2015   Dr. 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 +H.pylori . Surveillance due in Jan 2017 for ulcer healing  . ESOPHAGOGASTRODUODENOSCOPY (EGD) WITH PROPOFOL N/A 01/03/2016   Dr. Oneida Alar: 2 columns of Grade 1 varices, mild gastropathy, mild gastritis   . ESOPHAGOGASTRODUODENOSCOPY (EGD) WITH PROPOFOL N/A 03/05/2017   Procedure: ESOPHAGOGASTRODUODENOSCOPY (EGD) WITH PROPOFOL;  Surgeon: Danie Binder, MD;  Location[facility];  Service: Endoscopy;  Laterality: N/A;  9:45 AM  . 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 POLYPECTOMY  01/03/2016   Procedure: POLYPECTOMY;  Surgeon: Danie Binder, MD;  Location: [facility] END[facility];  Service: Endoscopy;;  transverse colon polyps x2, sigmoid colon polyps x4, rectal polyps x5  . UMBILICAL HERNIA REPAIR N/A 03/11/2016   Procedure: laparoscopy, open HERNIA REPAIR UMBILICAL ADULT, small bowel resection ;  Surgeon: Leighton Ruff, MD;  Location: [facility] ORS;  Service: General;  Laterality: N/A;        Home Medications    Prior to Admission medications   Medication Sig Start Date End Date Taking? Authorizing Provider  albuterol (PROVENTIL HFA;VENTOLIN HFA) 108 (90 Base) MCG/ACT inhaler Inhale  2 puffs into the lungs every 6 (six) hours as needed for wheezing or shortness of breath. 10/31/16   Funches, Adriana Mccallum, MD  cetirizine (ZYRTEC) 10 MG tablet Take 1 tablet (10 mg total) by mouth daily. 08/23/15   Funches, Josalyn, MD  fluticasone (FLONASE) 50 MCG/ACT nasal spray PLACE 2 SPRAYS INTO BOTH NOSTRILS DAILY. 08/03/16   Funches, Adriana Mccallum, MD  nadolol (CORGARD) 20 MG tablet TAKE 1 TABLET BY MOUTH DAILY. 06/04/17   Carlis Stable, NP  omeprazole (PRILOSEC) 20 MG capsule TAKE 1 CAPSULE BY MOUTH TWICE DAILY 30 MINUTES BEFORE  MEALS FOR 3 MONTHS AND THEN 1 CAPSULE DAILY. Patient taking differently: TAKE 1 CAPSULE BY MOUTH DAILY 30 MINUTES BEFORE MEALS 11/02/16   Carlis Stable, NP  tetrahydrozoline 0.05 % ophthalmic solution Place 2 drops into both eyes every evening.    [provider]    Family History Family History  Problem Relation Age of Onset  . Cancer Mother   . Cancer Father   . Colon cancer Neg Hx   . Liver disease Neg Hx     Social History Social History   Tobacco Use  . Smoking status: Current Every Day Smoker    Packs/day: 1.00    Years: 45.00    Pack years: 45.00    Types: Cigarettes  . Smokeless tobacco: Never Used  Substance Use Topics  . Alcohol use: No    Alcohol/week: 0.0 standard drinks    Comment: former heavy drinker, stopped in March 2016  . Drug use: No     Allergies   Patient has no known allergies.   Review of Systems Review of Systems  Constitutional: Negative for appetite change.  HENT: Negative for congestion.   Respiratory: Negative for shortness of breath.   Cardiovascular: Negative for chest pain.  Gastrointestinal: Negative for abdominal distention.  Genitourinary: Negative for frequency.  Musculoskeletal: Negative for back pain.  Skin: Negative for rash.  Neurological: Negative for weakness.  Psychiatric/Behavioral: Positive for dysphoric mood. Negative for suicidal ideas.     Physical Exam Updated Vital Signs BP (!) 150/75 (BP Location: Left Arm)   Pulse 80   Temp 98.2 F (36.8 C) (Oral)   Resp 15   Wt 90.3 kg   SpO2 95%   BMI 28.55 kg/m   Physical Exam Vitals signs and nursing note reviewed.  HENT:     Head: Atraumatic.     Nose: Nose normal.     Mouth/Throat:     Mouth: Mucous membranes are moist.  Eyes:     Extraocular Movements: Extraocular movements intact.  Cardiovascular:     Rate and Rhythm: Normal rate and regular rhythm.  Pulmonary:     Effort: Pulmonary effort is normal.     Breath sounds: No rhonchi or rales.   Abdominal:     Tenderness: There is no abdominal tenderness.  Musculoskeletal:     Right lower leg: No edema.     Left lower leg: No edema.  Skin:    General: Skin is warm.  Neurological:     Mental Status: He is alert and oriented to person, place, and time.  Psychiatric:        Mood and Affect: Mood normal.      ED Treatments / Results  Labs (all labs ordered are listed, but only abnormal results are displayed) Labs Reviewed  COMPREHENSIVE METABOLIC PANEL - Abnormal; Notable for the following components:      Result Value   BUN 21 (*)  Calcium 8.6 (*)    AST 54 (*)    All other components within normal limits  ETHANOL - Abnormal; Notable for the following components:   Alcohol, Ethyl (B) 309 (*)    All other components within normal limits  ACETAMINOPHEN LEVEL - Abnormal; Notable for the following components:   Acetaminophen (Tylenol), Serum <10 (*)    All other components within normal limits  SALICYLATE LEVEL  CBC  RAPID URINE DRUG SCREEN, HOSP PERFORMED    EKG None  Radiology No results found.  Procedures Procedures (including critical care time)  Medications Ordered in ED Medications  LORazepam (ATIVAN) injection 0-4 mg (has no administration in time range)    Or  LORazepam (ATIVAN) tablet 0-4 mg (has no administration in time range)  LORazepam (ATIVAN) injection 0-4 mg (has no administration in time range)    Or  LORazepam (ATIVAN) tablet 0-4 mg (has no administration in time range)  thiamine (VITAMIN B-1) tablet 100 mg (has no administration in time range)    Or  thiamine (B-1) injection 100 mg (has no administration in time range)     Initial Impression / Assessment and Plan / ED Course  I have reviewed the triage vital signs and the nursing notes.  Pertinent labs & imaging results that were available during my care of the patient were reviewed by me and considered in my medical decision making (see chart for details).        Patient  brought in under IVC.  States that he was suicidal.  Patient denies telling anyone today at the doctor's office that he was suicidal but states that he did have suicidal thoughts on Monday.  States that is resolved.  Does not have a psychiatrist or therapist that he sees however.  Patient does drink heavily.  Alcohol level is elevated.  I think patient is medically cleared at this time.  To be seen by TTS.  Final Clinical Impressions(s) / ED Diagnoses   Final diagnoses:  Alcoholism [facility])    ED Discharge Orders    None       Davonna Belling, MD 01/30/19 2233

## 2019-01-30 NOTE — ED Notes (Signed)
Pt A&O x 3, no distress noted, calm & cooperative, presents with SI, no plan.  Pt drinking every day he reports  Denies HI or AVH.  Pt states he sometimes feels hopeless.  Pt is IVC.  Monitoring for safety, Q 15 min checks in effect.  Comfort measures given, pt eating meal at present.

## 2019-01-30 NOTE — ED Notes (Signed)
Bed: [facility] Expected date:  Expected time:  Means of arrival:  Comments: Triage 6

## 2019-01-30 NOTE — Progress Notes (Signed)
Pt is recommended for continued observation for safety and stabilization and to be reassessed in the AM by psych per Lindon Romp, NP. EDP Davonna Belling, MD and Darryll Capers, RN have been advised of the disposition.   Lind Covert, MSW, LCSW Therapeutic Triage Specialist  [phone]

## 2019-01-30 NOTE — ED Triage Notes (Signed)
Pt is transported to the ED [facility] officer with IVC papers.  Pt reports he spoke to a counselor at [place] center Monday, states he was having SI with a plan but he would not disclose his plan.  He verbalized to this nurse that he should not be here, that he will attempt to escape the minute he has the chance.

## 2019-01-30 NOTE — BH Assessment (Addendum)
Tele Assessment Note   Patient Name: Kevin Nicholson MRN: [id number] Referring Physician: Davonna Belling, MD Location of Patient: [facility] Location of Provider: [place] is an 58 y.o. male who presents to the ED under IVC initiated by a QP with [facility] mobile crisis. Per IVC, respondent "has been diagnosed with depression, anxiety, and has a alcohol drinking problem. He admits to suicidal ideations and having a plan, however he refused to disclose the plan. He stated on Monday that he had a plan to go with kill himself on Monday and hospitalized in [facility]. Today he reported he has drunk a 5th of alcohol. His primary doctor Heide Scales, NP-C has expressed concerns about results from depression and anxiety screening."   Pt is irritable during the assessment. Pt states he does not want to speak with this writer because "all ya'll do is make me mad." Pt states he was at home with his sister and law enforcement arrived to bring him to the ED. Pt denies SI, HI, and AVH. Pt states he has never had MH treatment and he is not looking for any type of MH treatment. Pt admits to drinking alcohol daily but refuses to disclose additional details. Pt demands to be d/c and states he does not want to be in the ED.   Pt refuses to provide consent for TTS to speak with any collateral contacts or supports. Pt does not disclose who contacted mobile crisis and if he is the one who contacted them. TTS contacted mobile crisis via [phone] and spoke with Cristy Hilts who reviewed the notes from the petitioner of the IVC. Ivin Booty reports the petitioner is currently unavailable however she is able to review the notes from the QP who initiated the IVC. Mobile crisis rep reports the pt expressed SI and stated that he locked himself in his car last week with a pistol. Pt reportedly told mobile crisis that he has had ongoing SI with plan. EDP  note reports the pt has access to guns.   Pt is recommended for continued observation for safety and stabilization and to be reassessed in the AM by psych per Lindon Romp, NP. EDP Davonna Belling, MD and Darryll Capers, RN have been advised of the disposition.   Diagnosis: MDD, single episode, severe, w/o psychosis; Alcohol use disorder, severe; Substance induced mood disorder  Past Medical History:  Past Medical History:  Diagnosis Date  . Ascites   . Cirrhosis ([place])    varices  . Hypertension   . Shortness of breath dyspnea    with stair climbing  . Ventral hernia     Past Surgical History:  Procedure Laterality Date  . BACK SURGERY  2000  . BIOPSY N/A 09/13/2015   Procedure: BIOPSY;  Surgeon: Danie Binder, MD;  Location: [facility] OR[facility];  Service: Endoscopy;  Laterality: N/A;  gastric,   . BIOPSY  01/03/2016   Procedure: BIOPSY;  Surgeon: Danie Binder, MD;  [facility];  Service: Endoscopy;;  gastric bx's  . COLONOSCOPY WITH PROPOFOL N/A 01/03/2016   Dr. Oneida Alar: 2 simple adenomas, 9 hyperplastic polyps, colonoscopy in 5 years   . ESOPHAGOGASTRODUODENOSCOPY (EGD) WITH PROPOFOL N/A 09/13/2015   Dr. 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 +H.pylori . Surveillance due in Jan 2017 for ulcer healing  . ESOPHAGOGASTRODUODENOSCOPY (EGD) WITH PROPOFOL N/A 01/03/2016   Dr. Oneida Alar: 2 columns of Grade 1 varices, mild gastropathy, mild gastritis   .  ESOPHAGOGASTRODUODENOSCOPY (EGD) WITH PROPOFOL N/A 03/05/2017   Procedure: ESOPHAGOGASTRODUODENOSCOPY (EGD) WITH PROPOFOL;  Surgeon: Danie Binder, MD;  [facility];  Service: Endoscopy;  Laterality: N/A;  9:45 AM  . 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 POLYPECTOMY  01/03/2016   Procedure: POLYPECTOMY;  Surgeon: Danie Binder, MD;  [facility];  Service: Endoscopy;;  transverse colon polyps x2, sigmoid colon polyps x4, rectal polyps x5  . UMBILICAL  HERNIA REPAIR N/A 03/11/2016   Procedure: laparoscopy, open HERNIA REPAIR UMBILICAL ADULT, small bowel resection ;  Surgeon: Leighton Ruff, MD;  Location: [facility] ORS;  Service: General;  Laterality: N/A;    Family History:  Family History  Problem Relation Age of Onset  . Cancer Mother   . Cancer Father   . Colon cancer Neg Hx   . Liver disease Neg Hx     Social History:  reports that he has been smoking cigarettes. He has a 45.00 pack-year smoking history. He has never used smokeless tobacco. He reports that he does not drink alcohol or use drugs.  Additional Social History:  Alcohol / Drug Use Pain Medications: See MAR Prescriptions: See MAR Over the Counter: See MAR History of alcohol / drug use?: Yes Longest period of sobriety (when/how long): unknown Substance #1 Name of Substance 1: Alcohol 1 - Age of First Use: unknown, pt refuses to disclose 1 - Amount (size/oz): BAL 309 1 - Frequency: daily 1 - Duration: ongoing 1 - Last Use / Amount: 01/30/19  CIWA: CIWA-Ar BP: (!) 150/75 Pulse Rate: 80 COWS:    Allergies: No Known Allergies  Home Medications: (Not in a hospital admission)   OB/GYN Status:  No LMP for male patient.  General Assessment Data [facility] of Assessment: [facility] ED TTS Assessment: In system Is this a Tele or Face-to-Face Assessment?: Tele Assessment Is this an Initial Assessment or a Re-assessment for this encounter?: Initial Assessment Patient Accompanied by:: N/A Language Other than English: No Living Arrangements: Other (Comment) What gender do you identify as?: Male Marital status: (UTA due to pt refused to respond to questions) Pregnancy Status: No Living Arrangements: Other relatives(sister) Can pt return to current living arrangement?: Yes Admission Status: Involuntary Petitioner: Other(QP) Is patient capable of signing voluntary admission?: No Referral Source: Other Insurance type: MCR     Crisis Care Plan Living Arrangements: Other  relatives(sister) Name of Psychiatrist: pt denies Name of Therapist: pt denies  Education Status Is patient currently in school?: No Is the patient employed, unemployed or receiving disability?: (UTA due to pt refused to respond to questions)  Risk to self with the past 6 months Suicidal Ideation: Yes-Currently Present(per IVC) Has patient been a risk to self within the past 6 months prior to admission? : Yes Suicidal Intent: Yes-Currently Present Has patient had any suicidal intent within the past 6 months prior to admission? : Yes Is patient at risk for suicide?: Yes Suicidal Plan?: Yes-Currently Present Has patient had any suicidal plan within the past 6 months prior to admission? : Yes Specify Current Suicidal Plan: IVC and collateral contact from mobile crisis report the pt made threats to shoot himself  Access to Means: Yes Specify Access to Suicidal Means: pt reportedly has access to guns What has been your use of drugs/alcohol within the last 12 months?: alcohol Previous Attempts/Gestures: No Triggers for Past Attempts: None known Intentional Self Injurious Behavior: None Family Suicide History: No Recent stressful  life event(s): Other (Comment)(UTA due to pt refused to respond to questions) Persecutory voices/beliefs?: No Depression: Yes Depression Symptoms: Feeling angry/irritable Substance abuse history and/or treatment for substance abuse?: Yes Suicide prevention information given to non-admitted patients: Not applicable  Risk to Others within the past 6 months Homicidal Ideation: No(pt denies) Does patient have any lifetime risk of violence toward others beyond the six months prior to admission? : No Thoughts of Harm to Others: No Current Homicidal Intent: No Current Homicidal Plan: No Access to Homicidal Means: No History of harm to others?: No Assessment of Violence: None Noted Does patient have access to weapons?: Yes (Comment)(guns) Criminal Charges Pending?:  (UTA due to pt refused to respond to questions) Does patient have a court date: (UTA due to pt refused to respond to questions) Is patient on probation?: Unknown  Psychosis Hallucinations: None noted Delusions: None noted  Mental Status Report Appearance/Hygiene: Disheveled, In scrubs Eye Contact: Poor Motor Activity: Freedom of movement Speech: Aggressive Level of Consciousness: Alert, Irritable Mood: Angry, Irritable Affect: Angry Anxiety Level: None Thought Processes: Relevant, Coherent Judgement: Impaired Orientation: Person, Place, Time Obsessive Compulsive Thoughts/Behaviors: None  Cognitive Functioning Concentration: Normal Memory: Unable to Assess Is patient IDD: No Insight: Poor Impulse Control: Poor Appetite: (UTA due to pt refused to respond to questions) Have you had any weight changes? : (UTA due to pt refused to respond to questions) Sleep: Unable to Assess Vegetative Symptoms: None  ADLScreening [facility] Assessment Services) Patient's cognitive ability adequate to safely complete daily activities?: Yes Patient able to express need for assistance with ADLs?: Yes Independently performs ADLs?: Yes (appropriate for developmental age)  Prior Inpatient Therapy Prior Inpatient Therapy: No  Prior Outpatient Therapy Prior Outpatient Therapy: No Does patient have an ACCT team?: No Does patient have Intensive In-House Services?  : No Does patient have [facility] services? : No Does patient have P4CC services?: No  ADL Screening (condition at time of admission) Patient's cognitive ability adequate to safely complete daily activities?: Yes Is the patient deaf or have difficulty hearing?: No Does the patient have difficulty seeing, even when wearing glasses/contacts?: No Does the patient have difficulty concentrating, remembering, or making decisions?: No Patient able to express need for assistance with ADLs?: Yes Does the patient have difficulty dressing or bathing?:  No Independently performs ADLs?: Yes (appropriate for developmental age) Does the patient have difficulty walking or climbing stairs?: No Weakness of Legs: None Weakness of Arms/Hands: Right  Home Assistive Devices/Equipment Home Assistive Devices/Equipment: None    Abuse/Neglect Assessment (Assessment to be complete while patient is alone) Abuse/Neglect Assessment Can Be Completed: Unable to assess, patient is non-responsive or altered mental status     Advance Directives (For Healthcare) Does Patient Have a Medical Advance Directive?: No Would patient like information on creating a medical advance directive?: No - Patient declined          Disposition: Pt is recommended for continued observation for safety and stabilization and to be reassessed in the AM by psych per Lindon Romp, NP. EDP Davonna Belling, MD and Darryll Capers, RN have been advised of the disposition.  Disposition Initial Assessment Completed for this Encounter: Yes Disposition of Patient: (overnight OBS pending AM psych assessment) Patient refused recommended treatment: No  This service was provided via telemedicine using a 2-way, interactive audio and video technology.  Names of all persons participating in this telemedicine service and their role in this encounter. Name: Kincaid Tiger Sammons Role: Patient  Name: Lind Covert Role: TTS  Lyanne Co 01/30/2019 11:30 PM

## 2019-01-31 ENCOUNTER — Other Ambulatory Visit: Payer: Self-pay

## 2019-01-31 ENCOUNTER — Inpatient Hospital Stay (HOSPITAL_COMMUNITY)
Admission: AD | Admit: 2019-01-31 | Discharge: 2019-02-03 | DRG: 897 | Disposition: A | Discharge status: Home / Self Care | Payer: Medicare Other | Source: Intra-hospital | Attending: Psychiatry | Admitting: Psychiatry

## 2019-01-31 ENCOUNTER — Encounter (HOSPITAL_COMMUNITY): Payer: Self-pay

## 2019-01-31 DIAGNOSIS — F17210 Nicotine dependence, cigarettes, uncomplicated: Secondary | ICD-10-CM | POA: Diagnosis present

## 2019-01-31 DIAGNOSIS — K7460 Unspecified cirrhosis of liver: Secondary | ICD-10-CM | POA: Diagnosis present

## 2019-01-31 DIAGNOSIS — Z811 Family history of alcohol abuse and dependence: Secondary | ICD-10-CM

## 2019-01-31 DIAGNOSIS — F322 Major depressive disorder, single episode, severe without psychotic features: Secondary | ICD-10-CM

## 2019-01-31 DIAGNOSIS — Z79899 Other long term (current) drug therapy: Secondary | ICD-10-CM

## 2019-01-31 DIAGNOSIS — F1024 Alcohol dependence with alcohol-induced mood disorder: Principal | ICD-10-CM | POA: Diagnosis present

## 2019-01-31 DIAGNOSIS — F10229 Alcohol dependence with intoxication, unspecified: Secondary | ICD-10-CM | POA: Diagnosis present

## 2019-01-31 DIAGNOSIS — R45851 Suicidal ideations: Secondary | ICD-10-CM | POA: Diagnosis present

## 2019-01-31 DIAGNOSIS — F1994 Other psychoactive substance use, unspecified with psychoactive substance-induced mood disorder: Secondary | ICD-10-CM | POA: Diagnosis not present

## 2019-01-31 DIAGNOSIS — K219 Gastro-esophageal reflux disease without esophagitis: Secondary | ICD-10-CM | POA: Diagnosis present

## 2019-01-31 DIAGNOSIS — Z590 Homelessness: Secondary | ICD-10-CM | POA: Diagnosis not present

## 2019-01-31 DIAGNOSIS — Z8711 Personal history of peptic ulcer disease: Secondary | ICD-10-CM

## 2019-01-31 DIAGNOSIS — I10 Essential (primary) hypertension: Secondary | ICD-10-CM | POA: Diagnosis present

## 2019-01-31 DIAGNOSIS — F410 Panic disorder [episodic paroxysmal anxiety] without agoraphobia: Secondary | ICD-10-CM | POA: Diagnosis present

## 2019-01-31 DIAGNOSIS — F329 Major depressive disorder, single episode, unspecified: Secondary | ICD-10-CM | POA: Diagnosis present

## 2019-01-31 LAB — RAPID URINE DRUG SCREEN, HOSP PERFORMED
Amphetamines: NOT DETECTED
Barbiturates: NOT DETECTED
Benzodiazepines: NOT DETECTED
Cocaine: NOT DETECTED
Opiates: NOT DETECTED
Tetrahydrocannabinol: NOT DETECTED

## 2019-01-31 MED ORDER — TRAZODONE HCL 100 MG PO TABS
100.0000 mg | ORAL_TABLET | Freq: Every day | ORAL | Status: DC
  Administered 2019-01-31: 100 mg via ORAL
  Filled 2019-01-31 (×4): qty 1

## 2019-01-31 MED ORDER — LORAZEPAM 1 MG PO TABS
0.0000 mg | ORAL_TABLET | Freq: Four times a day (QID) | ORAL | Status: DC
  Administered 2019-01-31 – 2019-02-01 (×2): 1 mg via ORAL
  Filled 2019-01-31 (×2): qty 1

## 2019-01-31 MED ORDER — ACETAMINOPHEN 325 MG PO TABS: 650.0000 mg | ORAL_TABLET | Freq: Four times a day (QID) | ORAL | Status: DC | PRN

## 2019-01-31 MED ORDER — LORAZEPAM 1 MG PO TABS: 0.0000 mg | ORAL_TABLET | Freq: Two times a day (BID) | ORAL | Status: DC

## 2019-01-31 MED ORDER — LORAZEPAM 2 MG/ML IJ SOLN: 0.0000 mg | Freq: Two times a day (BID) | INTRAMUSCULAR | Status: DC

## 2019-01-31 MED ORDER — ALUM & MAG HYDROXIDE-SIMETH 200-200-20 MG/5ML PO SUSP: 30.0000 mL | ORAL | Status: DC | PRN

## 2019-01-31 MED ORDER — MAGNESIUM HYDROXIDE 400 MG/5ML PO SUSP: 30.0000 mL | Freq: Every day | ORAL | Status: DC | PRN

## 2019-01-31 MED ORDER — TRAZODONE HCL 100 MG PO TABS: 100.0000 mg | ORAL_TABLET | Freq: Every day | ORAL | Status: DC

## 2019-01-31 MED ORDER — THIAMINE HCL 100 MG/ML IJ SOLN: 100.0000 mg | Freq: Every day | INTRAMUSCULAR | Status: DC

## 2019-01-31 MED ORDER — LORAZEPAM 2 MG/ML IJ SOLN: 0.0000 mg | Freq: Four times a day (QID) | INTRAMUSCULAR | Status: DC

## 2019-01-31 MED ORDER — VITAMIN B-1 100 MG PO TABS
100.0000 mg | ORAL_TABLET | Freq: Every day | ORAL | Status: DC
  Filled 2019-01-31: qty 1

## 2019-01-31 NOTE — BH Assessment (Signed)
[place] Assessment Progress Note Per [facility] AC patient has been accepted to 307-2 after 2100 hours. AC to coordinate later this date.

## 2019-01-31 NOTE — Progress Notes (Signed)
Patient ID: Kevin Nicholson, male   DOB: 1961-01-19, 58 y.o.   MRN: [id number]  Admission Note  D) Patient admitted to the adult unit 300 hall. Patient is a 58 year old male who is under IVC from [facility]. Patient reports he is here because "the police brought me in". Patient reports he does not need to be here and would like to discharge. Patient states, "you can ask me whatever, but I will just say yes, yes, I am fine because I want to leave". Patient affect appears angry and he does present irritable. Patient is cooperative with admission process. Patient states he lives with his sister and brother in [place]. Patient reports he drinks 1/5 of liquor daily with last drink yesterday. Patient denies current withdrawal symptoms. Patient declined to participate in most of the admission assessment-chart review conducted to complete admission. Patient denies taking medications at this time and denies SI/HI/AVH now. Patient denies legal issues.  Per chart review, patient was IVC by Mobile Crisis Unit due to expressing SI and would not disclose plan. BAL was 309 and UDS was negative on admit to ED.  Skin assessment was completed and unremarkable except for brittle, thin skin and bruising to arms bilaterally. Patient belongings searched with no contraband found. Belongings secured in locker. Vital signs obtained. Snacks and fluids offered.    A) Plan of care, unit policies and patient expectations were explained. Written consents obtained. Patient oriented to the unit and their room. Patient placed on standard q15 safety checks. High fall risk precautions initiated and reviewed with patient.   R) Patient is in no acute distress and verbalizes understanding of information provided. Patient with no concerns at this time. Patient contracts for safety with staff on the unit. Report given to receiving RN.

## 2019-01-31 NOTE — ED Notes (Signed)
Patient is alert and oriented to person, place and time.  Presents with anxious affect and mood.  Denies suicidal thoughts, auditory and visual hallucinations.  Medication given as prescribed.  Reports tremors as withdrawal symptoms. Routine safety checks maintained every 15 minutes and via security camera. Verbalizes understanding of discharge instruction and transfer.  Report given to Otila Kluver T.,RN at [facility]

## 2019-01-31 NOTE — Consult Note (Signed)
[facility] Face-to-Face Psychiatry Consult   Reason for Consult:  ''suicidal with plan'' Referring Physician:  EDP Patient Identification: Kevin Nicholson MRN:  [id number] Principal Diagnosis: Major depressive disorder, single episode, severe ([place]) Diagnosis:  Principal Problem:   Major depressive disorder, single episode, severe ([place]) Active Problems:   Alcohol intoxication with moderate or severe use disorder ([place])   Total Time spent with patient: 45 minutes  Subjective:   Kevin Nicholson is a 58 y.o. male patient admitted with suicidal with plan.  HPI:  58 y.o. male who reports history of Depression, Alcohol use disorder-severe but not complinat with his medications. He was brought to [facility] under IVC initiated by a QP with [facility] mobile crisis. Per IVC, respondent "has been diagnosed with depression, anxiety, and has a alcohol drinking problem. He admits to suicidal ideations and having a plan, however he refused to disclose the plan. He stated on Monday that he had a plan to go with kill himself on Monday and hospitalized in [facility]. Today he reported he has drunk a 5th of alcohol. His primary doctor Kevin Scales, NP-C has expressed concerns about results from depression and anxiety screening."  Currently, patient refused to disclose information about himself other drinking a 5th of alcohol on daily basis and feeling upset since a breakup with someone he refused to disclose. Patient unable to contract for safety.  Past Psychiatric History: as above  Risk to Self: Suicidal Ideation: Yes-Currently Present(per IVC) Suicidal Intent: Yes-Currently Present Is patient at risk for suicide?: Yes Suicidal Plan?: Yes-Currently Present Specify Current Suicidal Plan: IVC and collateral contact from mobile crisis report the pt made threats to shoot himself  Access to Means: Yes Specify Access to Suicidal Means: pt reportedly has access to guns What has been your  use of drugs/alcohol within the last 12 months?: alcohol Triggers for Past Attempts: None known Intentional Self Injurious Behavior: None Risk to Others: Homicidal Ideation: No(pt denies) Thoughts of Harm to Others: No Current Homicidal Intent: No Current Homicidal Plan: No Access to Homicidal Means: No History of harm to others?: No Assessment of Violence: None Noted Does patient have access to weapons?: Yes (Comment)(guns) Criminal Charges Pending?: (UTA due to pt refused to respond to questions) Does patient have a court date: (UTA due to pt refused to respond to questions) Prior Inpatient Therapy: Prior Inpatient Therapy: No Prior Outpatient Therapy: Prior Outpatient Therapy: No Does patient have an ACCT team?: No Does patient have Intensive In-House Services?  : No Does patient have [facility] services? : No Does patient have P4CC services?: No  Past Medical History:  Past Medical History:  Diagnosis Date  . Ascites   . 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 Hypertension   . Shortness of breath dyspnea    with stair climbing  . Ventral hernia     Past Surgical History:  Procedure Laterality Date  . BACK SURGERY  2000  . BIOPSY N/A 09/13/2015   Procedure: BIOPSY;  Surgeon: Danie Binder, MD;  Location: [facility];  Service: Endoscopy;  Laterality: N/A;  gastric,   . BIOPSY  01/03/2016   Procedure: BIOPSY;  Surgeon: Danie Binder, MD;  Location: [facility];  Service: Endoscopy;;  gastric bx's  . COLONOSCOPY WITH PROPOFOL N/A 01/03/2016   Dr. Oneida Alar: 2 simple adenomas, 9 hyperplastic polyps, colonoscopy in 5 years   . ESOPHAGOGASTRODUODENOSCOPY (EGD) WITH PROPOFOL N/A 09/13/2015   Dr. Hoy Register columns grade I varices/3 small ulcers in the  gastric antrum. +H.pylori . Surveillance due in Jan 2017 for ulcer healing  . ESOPHAGOGASTRODUODENOSCOPY (EGD) WITH PROPOFOL N/A 01/03/2016   Dr. Oneida Alar: 2 columns of Grade 1 varices, mild gastropathy, mild gastritis   . ESOPHAGOGASTRODUODENOSCOPY  (EGD) WITH PROPOFOL N/A 03/05/2017   Procedure: ESOPHAGOGASTRODUODENOSCOPY (EGD) WITH PROPOFOL;  Surgeon: Danie Binder, MD;  Location[facility];  Service: Endoscopy;  Laterality: N/A;  9:45 AM  . 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 POLYPECTOMY  01/03/2016   Procedure: POLYPECTOMY;  Surgeon: Danie Binder, MD;  Location: [facility];  Service: Endoscopy;;  transverse colon polyps x2, sigmoid colon polyps x4, rectal polyps x5  . UMBILICAL HERNIA REPAIR N/A 03/11/2016   Procedure: laparoscopy, open HERNIA REPAIR UMBILICAL ADULT, small bowel resection ;  Surgeon: Leighton Ruff, MD;  Location: [facility];  Service: General;  Laterality: N/A;   Family History:  Family History  Problem Relation Age of Onset  . Cancer Mother   . Cancer Father   . Colon cancer Neg Hx   . Liver disease Neg Hx    Family Psychiatric  History: Social History:  Social History   Substance and Sexual Activity  Alcohol Use No  . Alcohol/week: 0.0 standard drinks   Comment: former heavy drinker, stopped in March 2016     Social History   Substance and Sexual Activity  Drug Use No    Social History   Socioeconomic History  . Marital status: Single    Spouse name: Not on file  . Number of children: Not on file  . Years of education: Not on file  . Highest education level: Not on file  Occupational History  . Not on file  Social Needs  . Financial resource strain: Not on file  . Food insecurity:    Worry: Not on file    Inability: Not on file  . Transportation needs:    Medical: Not on file    Non-medical: Not on file  Tobacco Use  . Smoking status: Current Every Day Smoker    Packs/day: 1.00    Years: 45.00    Pack years: 45.00    Types: Cigarettes  . Smokeless tobacco: Never Used  Substance and Sexual Activity  . Alcohol use: No    Alcohol/week: 0.0 standard drinks    Comment: former heavy drinker, stopped in March 2016  . Drug use: No  . Sexual  activity: Not on file  Lifestyle  . Physical activity:    Days per week: Not on file    Minutes per session: Not on file  . Stress: Not on file  Relationships  . Social connections:    Talks on phone: Not on file    Gets together: Not on file    Attends religious service: Not on file    Active member of club or organization: Not on file    Attends meetings of clubs or organizations: Not on file    Relationship status: Not on file  Other Topics Concern  . Not on file  Social History Narrative  . Not on file   Additional Social History:    Allergies:  No Known Allergies  Labs:  Results for orders placed or performed during the hospital encounter of 01/30/19 (from the past 48 hour(s))  Comprehensive metabolic panel     Status: Abnormal   Collection Time: 01/30/19  9:19 PM  Result Value Ref Range   Sodium  135 135 - 145 mmol/L   Potassium 3.8 3.5 - 5.1 mmol/L   Chloride 100 98 - 111 mmol/L   CO2 23 22 - 32 mmol/L   Glucose, Bld 96 70 - 99 mg/dL   BUN 21 (H) 6 - 20 mg/dL   Creatinine, Ser 1.01 0.61 - 1.24 mg/dL   Calcium 8.6 (L) 8.9 - 10.3 mg/dL   Total Protein 7.6 6.5 - 8.1 g/dL   Albumin 4.5 3.5 - 5.0 g/dL   AST 54 (H) 15 - 41 U/L   ALT 38 0 - 44 U/L   Alkaline Phosphatase 88 38 - 126 U/L   Total Bilirubin 1.1 0.3 - 1.2 mg/dL   GFR calc non Af Amer >60 >60 mL/min   GFR calc Af Amer >60 >60 mL/min   Anion gap 12 5 - 15    Comment: Performed at [facility], [place] [address], [place], [place] [address]  Ethanol     Status: Abnormal   Collection Time: 01/30/19  9:19 PM  Result Value Ref Range   Alcohol, Ethyl (B) 309 (HH) <10 mg/dL    Comment: CRITICAL RESULT CALLED TO, READ BACK BY AND VERIFIED WITH: TALKINGTON,J RN @2200  ON 01/30/2019 JACKSON,K (NOTE) Lowest detectable limit for serum alcohol is 10 mg/dL. For medical purposes only. Performed at [facility], [place] [address] [place], [place] [address]   Salicylate level      Status: None   Collection Time: 01/30/19  9:19 PM  Result Value Ref Range   Salicylate Lvl <6.7 2.8 - 30.0 mg/dL    Comment: Performed at [facility], [place] [address], [place], [place] [address]  Acetaminophen level     Status: Abnormal   Collection Time: 01/30/19  9:19 PM  Result Value Ref Range   Acetaminophen (Tylenol), Serum <10 (L) 10 - 30 ug/mL    Comment: (NOTE) Therapeutic concentrations vary significantly. A range of 10-30 ug/mL  may be an effective concentration for many patients. However, some  are best treated at concentrations outside of this range. Acetaminophen concentrations >150 ug/mL at 4 hours after ingestion  and >50 ug/mL at 12 hours after ingestion are often associated with  toxic reactions. Performed at [facility], [place] [address], [place], [place] [address]   cbc     Status: None   Collection Time: 01/30/19  9:19 PM  Result Value Ref Range   WBC 8.1 4.0 - 10.5 K/uL   RBC 4.53 4.22 - 5.81 MIL/uL   Hemoglobin 15.2 13.0 - 17.0 g/dL   HCT 44.3 39.0 - 52.0 %   MCV 97.8 80.0 - 100.0 fL   MCH 33.6 26.0 - 34.0 pg   MCHC 34.3 30.0 - 36.0 g/dL   RDW 12.3 11.5 - 15.5 %   Platelets 166 150 - 400 K/uL   nRBC 0.0 0.0 - 0.2 %    Comment: Performed at [facility], [place] [address], [place], [place] [address]    Current Facility-Administered Medications  Medication Dose Route Frequency Provider Last Rate Last Dose  . LORazepam (ATIVAN) injection 0-4 mg  0-4 mg Intravenous Q6H Davonna Belling, MD       Or  . LORazepam (ATIVAN) tablet 0-4 mg  0-4 mg Oral Q6H Davonna Belling, MD   2 mg at 01/31/19 0519  . [START ON 02/02/2019] LORazepam (ATIVAN) injection 0-4 mg  0-4 mg Intravenous Q12H Davonna Belling, MD       Or  . [  START ON 02/02/2019] LORazepam (ATIVAN) tablet 0-4 mg  0-4 mg Oral Q12H Davonna Belling, MD      . thiamine (VITAMIN B-1) tablet 100 mg  100 mg Oral Daily Davonna Belling, MD   100 mg at  01/31/19 7209   Or  . thiamine (B-1) injection 100 mg  100 mg Intravenous Daily Davonna Belling, MD      . traZODone (DESYREL) tablet 100 mg  100 mg Oral QHS Corena Pilgrim, MD       Current Outpatient Medications  Medication Sig Dispense Refill  . Ca Carbonate-Mag Hydroxide (ROLAIDS) 550-110 MG CHEW Chew 1 tablet by mouth daily as needed (indigestion).    . naproxen sodium (ALEVE) 220 MG tablet Take 220 mg by mouth daily as needed (pain).    Marland Kitchen albuterol (PROVENTIL HFA;VENTOLIN HFA) 108 (90 Base) MCG/ACT inhaler Inhale 2 puffs into the lungs every 6 (six) hours as needed for wheezing or shortness of breath. (Patient not taking: Reported on 01/30/2019) 54 g 3  . cetirizine (ZYRTEC) 10 MG tablet Take 1 tablet (10 mg total) by mouth daily. (Patient not taking: Reported on 01/30/2019) 30 tablet 11  . fluticasone (FLONASE) 50 MCG/ACT nasal spray PLACE 2 SPRAYS INTO BOTH NOSTRILS DAILY. (Patient not taking: Reported on 01/30/2019) 16 g 2  . nadolol (CORGARD) 20 MG tablet TAKE 1 TABLET BY MOUTH DAILY. (Patient not taking: Reported on 01/30/2019) 30 tablet 5  . omeprazole (PRILOSEC) 20 MG capsule TAKE 1 CAPSULE BY MOUTH TWICE DAILY 30 MINUTES BEFORE MEALS FOR 3 MONTHS AND THEN 1 CAPSULE DAILY. (Patient not taking: Reported on 01/30/2019) 60 capsule 11  . sertraline (ZOLOFT) 50 MG tablet Take 50 mg by mouth daily.       Musculoskeletal: Strength & Muscle Tone: within normal limits Gait & Station: normal Patient leans: N/A  Psychiatric Specialty Exam: Physical Exam  Psychiatric: Judgment normal. His affect is blunt. His speech is delayed. He is slowed and withdrawn. Cognition and memory are normal. He exhibits a depressed mood. He expresses suicidal ideation. He expresses suicidal plans.    Review of Systems  Constitutional: Positive for malaise/fatigue.  HENT: Negative.   Eyes: Negative.   Respiratory: Negative.   Cardiovascular: Negative.   Gastrointestinal: Negative.   Genitourinary:  Negative.   Musculoskeletal: Negative.   Neurological: Negative.   Endo/Heme/Allergies: Negative.   Psychiatric/Behavioral: Positive for depression, substance abuse and suicidal ideas.    Blood pressure (!) 156/73, pulse 100, temperature 98.3 F (36.8 C), temperature source Oral, resp. rate 20, weight 90.3 kg, SpO2 94 %.Body mass index is 28.55 kg/m.  General Appearance: Casual  Eye Contact:  Fair  Speech:  Clear and Coherent  Volume:  Decreased  Mood:  Depressed  Affect:  Constricted  Thought Process:  Coherent and Linear  Orientation:  Full (Time, Place, and Person)  Thought Content:  Logical  Suicidal Thoughts:  Yes.  with intent/plan  Homicidal Thoughts:  No  Memory:  Immediate;   Good Recent;   Good Remote;   Good  Judgement:  Poor  Insight:  Shallow  Psychomotor Activity:  Psychomotor Retardation  Concentration:  Concentration: Fair and Attention Span: Fair  Recall:  [employer] of Knowledge:  Fair  Language:  Good  Akathisia:  No  Handed:  Right  AIMS (if indicated):     Assets:  Communication Skills  ADL's:  Intact  Cognition:  WNL  Sleep:   fair     Treatment Plan Summary: Daily contact with patient to assess  and evaluate symptoms and progress in treatment and Medication management  -Continue Ativan Alcohol detox protocol -Add Trazodone 100 mg at bedtime for sleep.  Disposition: Recommend psychiatric Inpatient admission when medically cleared. Supportive therapy provided about ongoing stressors.  Corena Pilgrim, MD 01/31/2019 11:16 AM

## 2019-01-31 NOTE — Tx Team (Signed)
Initial Treatment Plan 01/31/2019 5:56 PM TERRI MALERBA UDT:[id number]    PATIENT STRESSORS: Substance abuse   PATIENT STRENGTHS: Communication skills Supportive family/friends   PATIENT IDENTIFIED PROBLEMS: "I just want to leave"  Substance Abuse   Suicide Risk                 DISCHARGE CRITERIA:  Ability to meet basic life and health needs Adequate post-discharge living arrangements Improved stabilization in mood, thinking, and/or behavior Medical problems require only outpatient monitoring Motivation to continue treatment in a less acute level of care Need for constant or close observation no longer present Reduction of life-threatening or endangering symptoms to within safe limits Safe-care adequate arrangements made Verbal commitment to aftercare and medication compliance Withdrawal symptoms are absent or subacute and managed without 24-hour nursing intervention  PRELIMINARY DISCHARGE PLAN: Outpatient therapy  PATIENT/FAMILY INVOLVEMENT: This treatment plan has been presented to and reviewed with the patient, [facility].  The patient and family have been given the opportunity to ask questions and make suggestions.  Annia Friendly, RN 01/31/2019, 5:56 PM

## 2019-02-01 DIAGNOSIS — F1024 Alcohol dependence with alcohol-induced mood disorder: Principal | ICD-10-CM

## 2019-02-01 DIAGNOSIS — Z811 Family history of alcohol abuse and dependence: Secondary | ICD-10-CM

## 2019-02-01 LAB — HEMOGLOBIN A1C
Hgb A1c MFr Bld: 4.7 % — ABNORMAL LOW (ref 4.8–5.6)
Mean Plasma Glucose: 88.19 mg/dL

## 2019-02-01 LAB — LIPID PANEL
Cholesterol: 192 mg/dL (ref 0–200)
HDL: 87 mg/dL (ref >40)
LDL Cholesterol: 92 mg/dL (ref 0–99)
Total CHOL/HDL Ratio: 2.2 RATIO
Triglycerides: 65 mg/dL (ref <150)
VLDL: 13 mg/dL (ref 0–40)

## 2019-02-01 LAB — TSH: TSH: 2.812 u[IU]/mL (ref 0.350–4.500)

## 2019-02-01 MED ORDER — TRAZODONE HCL 50 MG PO TABS: 50.0000 mg | ORAL_TABLET | Freq: Every evening | ORAL | Status: DC | PRN

## 2019-02-01 MED ORDER — HYDROXYZINE HCL 25 MG PO TABS: 25.0000 mg | ORAL_TABLET | Freq: Four times a day (QID) | ORAL | Status: DC | PRN

## 2019-02-01 MED ORDER — LORAZEPAM 1 MG PO TABS
1.0000 mg | ORAL_TABLET | Freq: Two times a day (BID) | ORAL | Status: DC
  Administered 2019-02-03: 1 mg via ORAL
  Filled 2019-02-01: qty 1

## 2019-02-01 MED ORDER — VITAMIN B-1 100 MG PO TABS
100.0000 mg | ORAL_TABLET | Freq: Every day | ORAL | Status: DC
  Administered 2019-02-01 – 2019-02-03 (×3): 100 mg via ORAL
  Filled 2019-02-01 (×6): qty 1

## 2019-02-01 MED ORDER — LORAZEPAM 1 MG PO TABS
1.0000 mg | ORAL_TABLET | Freq: Four times a day (QID) | ORAL | Status: AC
  Administered 2019-02-01 (×4): 1 mg via ORAL
  Filled 2019-02-01 (×4): qty 1

## 2019-02-01 MED ORDER — ADULT MULTIVITAMIN W/MINERALS CH
1.0000 | ORAL_TABLET | Freq: Every day | ORAL | Status: DC
  Administered 2019-02-01 – 2019-02-03 (×3): 1 via ORAL
  Filled 2019-02-01 (×6): qty 1

## 2019-02-01 MED ORDER — LORAZEPAM 1 MG PO TABS: 1.0000 mg | ORAL_TABLET | Freq: Every day | ORAL | Status: DC

## 2019-02-01 MED ORDER — ONDANSETRON 4 MG PO TBDP: 4.0000 mg | ORAL_TABLET | Freq: Four times a day (QID) | ORAL | Status: DC | PRN

## 2019-02-01 MED ORDER — LOPERAMIDE HCL 2 MG PO CAPS: 2.0000 mg | ORAL_CAPSULE | ORAL | Status: DC | PRN

## 2019-02-01 MED ORDER — THIAMINE HCL 100 MG/ML IJ SOLN: 100.0000 mg | Freq: Once | INTRAMUSCULAR | Status: DC

## 2019-02-01 MED ORDER — LORAZEPAM 1 MG PO TABS
1.0000 mg | ORAL_TABLET | Freq: Three times a day (TID) | ORAL | Status: AC
  Administered 2019-02-02 (×2): 1 mg via ORAL
  Filled 2019-02-01 (×2): qty 1

## 2019-02-01 NOTE — Progress Notes (Signed)
Writer spoke with patient 1:1 and he had very little to say. Writer informed him of medications schedule which he took. He reports that he does not have much of an appetite when offered a snack. He returned to his room. Safety maintained on unit with 15 min checks

## 2019-02-01 NOTE — [facility] Suicide Risk Assessment (Signed)
[facility] Admission Suicide Risk Assessment   Nursing information obtained from:  Patient Demographic factors:  Male, Caucasian Current Mental Status:  Suicidal ideation indicated by patient, Suicide plan, Self-harm thoughts Loss Factors:  Decline in physical health Historical Factors:  Impulsivity Risk Reduction Factors:  Sense of responsibility to family  Total Time spent with patient: 45 minutes Principal Problem: Alcohol dependence, alcohol induced mood disorder  Diagnosis:  Active Problems:   Substance induced mood disorder (HCC)  Subjective Data:   Continued Clinical Symptoms:  Alcohol Use Disorder Identification Test Final Score (AUDIT): 0 The "Alcohol Use Disorders Identification Test", Guidelines for Use in Primary Care, Second Edition.  World Pharmacologist [facility]). Score between 0-7:  no or low risk or alcohol related problems. Score between 8-15:  moderate risk of alcohol related problems. Score between 16-19:  high risk of alcohol related problems. Score 20 or above:  warrants further diagnostic evaluation for alcohol dependence and treatment.   CLINICAL FACTORS:  58 year old male, single, has recently been staying with his sister.  Reports he has a history of alcohol use disorder.  Drinks intermittently but states that over recent weeks he has been drinking up to 1/5 of liquor per day.  Apparently made suicidal statements during an assessment and was involuntarily committed.  Patient reports he does not remember having made suicidal statements, and acknowledges that he was intoxicated at the time.  Admission BAL 309.  Currently minimizes recent depression or significant/severe neurovegetative symptoms.  Categorically denies any suicidal ideations.  Presents future oriented and hopeful for discharge soon.     Psychiatric Specialty Exam: Physical Exam  ROS  Blood pressure (!) 147/75, pulse 95, temperature 98.2 F (36.8 C), temperature source Oral, resp. rate 18, height  5\' 10"  (1.778 m), weight 98.9 kg, SpO2 97 %.Body mass index is 31.28 kg/m.  See admission MSE note   COGNITIVE FEATURES THAT CONTRIBUTE TO RISK:  Closed-mindedness and Loss of executive function        I certify that inpatient services furnished can reasonably be expected to improve the patient's condition.   Jenne Campus, MD 02/01/2019, 1:46 PM

## 2019-02-01 NOTE — H&P (Addendum)
Psychiatric Admission Assessment Adult  Patient Identification: Kevin Nicholson MRN:  [id number] Date of Evaluation:  02/01/2019 Chief Complaint:  " I called the ambulance " Principal Diagnosis: Alcohol Use Disorder, Alcohol Induced Mood Disorder. Suicidal Ideations Diagnosis:  Active Problems:   Substance induced mood disorder (HCC)  History of Present Illness: 58 year old male . He  presented to ED on 3/13 under IVC petitioned by [facility], reporting that  Patient has a history of depression, alcohol abuse, and had made suicidal suicidal statements. Currently patient states " I really don't know what I said or told them. I was drunk  at the time" . Patient is unsure of the events that led to admission, but does remember he called an ambulance because " I wanted to stop drinking and I was too drunk to drive to the hospital" He reports he has been drinking " almost every day" over the last 1-2 weeks,  usually up to a fifth of bourbon a day. States his drinking pattern is variable and explains that he can go several days/weeks without alcohol. States he does " shake and sweat for a little bit" when he stops drinking. At present minimizes severe or significant depression prior to admission, and states " I was feeling OK". Currently denies significant depression and denies any SI. He reports " I am feeling OK, I 'm hoping I can go home tomorrow" Currently denies suicidal ideations, and minimizes severe neuro-vegetative symptoms.  Admission BAL 309, admission UDs negative. Associated Signs/Symptoms: Depression Symptoms:  Does describe some neuro -vegetative symptoms, mainly poor sleep, decreased appetite, but denies anhedonia, pervasive sadness, and currently denies having had recent suicidal ideations. (Hypo) Manic Symptoms:  Denies  Anxiety Symptoms:   Denies increased or significant anxiety Psychotic Symptoms:  Denies  PTSD Symptoms: Denies  Total Time spent with  patient: 45 minutes  Past Psychiatric History: no prior psychiatric admissions, states he has never attempted suicide, no history of self cutting or self injurious behaviors, denies history of psychosis. Denies history of severe or persistent depressive episodes in the past . Denies history of mania or hypomania. Reports prior history of panic attacks but states have not occurred in " a long time". Denies history of violence   Is the patient at risk to self? Yes.    Has the patient been a risk to self in the past 6 months? No.  Has the patient been a risk to self within the distant past? No.  Is the patient a risk to others? No.  Has the patient been a risk to others in the past 6 months? No.  Has the patient been a risk to others within the distant past? No.   Prior Inpatient Therapy:  denies  Prior Outpatient Therapy:  none recently   Alcohol Screening: 1. How often do you have a drink containing alcohol?: Never 2. How many drinks containing alcohol do you have on a typical day when you are drinking?: 1 or 2 3. How often do you have six or more drinks on one occasion?: Never AUDIT-C Score: 0 9. Have you or someone else been injured as a result of your drinking?: No 10. Has a relative or friend or a doctor or another health worker been concerned about your drinking or suggested you cut down?: No Alcohol Use Disorder Identification Test Final Score (AUDIT): 0 Alcohol Brief Interventions/Follow-up: AUDIT Score <7 follow-up not indicated Substance Abuse History in the last 12 months:  Endorses history of alcohol  consumption since adolescence, states he drinks variably, but recently has been drinking almost every day, up to a fifth of liquor daily. Consequences of Substance Abuse: Denies history of seizure, denies history of DTs, reports prior history of blackouts and of DUI in the remote past  Previous Psychotropic Medications: States he was not taking any medications prior to admission for  more than 2-3 years. Chart notes indicate prior treatment with Zoloft . Patient states he does not remember being on it.  Psychological Evaluations:  No  Past Medical History:  HTN. States he was not taking any medications prior to admission. Past Medical History:  Diagnosis Date  . Ascites   . 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 Hypertension   . Shortness of breath dyspnea    with stair climbing  . Ventral hernia     Past Surgical History:  Procedure Laterality Date  . BACK SURGERY  2000  . BIOPSY N/A 09/13/2015   Procedure: BIOPSY;  Surgeon: Danie Binder, MD;  Location: [facility];  Service: Endoscopy;  Laterality: N/A;  gastric,   . BIOPSY  01/03/2016   Procedure: BIOPSY;  Surgeon: Danie Binder, MD;  Location: [facility];  Service: Endoscopy;;  gastric bx's  . COLONOSCOPY WITH PROPOFOL N/A 01/03/2016   Dr. Oneida Alar: 2 simple adenomas, 9 hyperplastic polyps, colonoscopy in 5 years   . ESOPHAGOGASTRODUODENOSCOPY (EGD) WITH PROPOFOL N/A 09/13/2015   Dr. 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 +H.pylori . Surveillance due in Jan 2017 for ulcer healing  . ESOPHAGOGASTRODUODENOSCOPY (EGD) WITH PROPOFOL N/A 01/03/2016   Dr. Oneida Alar: 2 columns of Grade 1 varices, mild gastropathy, mild gastritis   . ESOPHAGOGASTRODUODENOSCOPY (EGD) WITH PROPOFOL N/A 03/05/2017   Procedure: ESOPHAGOGASTRODUODENOSCOPY (EGD) WITH PROPOFOL;  Surgeon: Danie Binder, MD;  Location: [facility];  Service: Endoscopy;  Laterality: N/A;  9:45 AM  . 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 POLYPECTOMY  01/03/2016   Procedure: POLYPECTOMY;  Surgeon: Danie Binder, MD;  Location: [facility];  Service: Endoscopy;;  transverse colon polyps x2, sigmoid colon polyps x4, rectal polyps x5  . UMBILICAL HERNIA REPAIR N/A 03/11/2016   Procedure: laparoscopy, open HERNIA REPAIR UMBILICAL ADULT, small bowel resection ;  Surgeon: Leighton Ruff, MD;  Location: [facility];   Service: General;  Laterality: N/A;   Family History: mother deceased from lung cancer, father alive- patient has minimal contact with him. Has one brother and one sister . Family History  Problem Relation Age of Onset  . Cancer Mother   . Cancer Father   . Colon cancer Neg Hx   . Liver disease Neg Hx    Family Psychiatric  History: Reports brother has history of alcohol use disorder , no suicides in family .  Tobacco Screening:  Smokes 1 PPD  Social History: 58, single, no children, states he is currently homeless, prior to admission had been staying   with sister/brother in law for a few days. Denies legal issues .  On disability. Social History   Substance and Sexual Activity  Alcohol Use Yes  . Alcohol/week: 0.0 standard drinks   Comment: former heavy drinker, stopped in March 2016     Social History   Substance and Sexual Activity  Drug Use No    Additional Social History:  Allergies:  No Known Allergies Lab Results:  Results for orders placed or performed during the hospital  encounter of 01/31/19 (from the past 48 hour(s))  Hemoglobin A1c     Status: Abnormal   Collection Time: 02/01/19  6:53 AM  Result Value Ref Range   Hgb A1c MFr Bld 4.7 (L) 4.8 - 5.6 %    Comment: (NOTE) Pre diabetes:          5.7%-6.4% Diabetes:              >6.4% Glycemic control for   <7.0% adults with diabetes    Mean Plasma Glucose 88.19 mg/dL    Comment: Performed at [facility] [address], [place], [place] [address]  Lipid panel     Status: None   Collection Time: 02/01/19  6:53 AM  Result Value Ref Range   Cholesterol 192 0 - 200 mg/dL   Triglycerides 65 <150 mg/dL   HDL 87 >40 mg/dL   Total CHOL/HDL Ratio 2.2 RATIO   VLDL 13 0 - 40 mg/dL   LDL Cholesterol 92 0 - 99 mg/dL    Comment:        Total Cholesterol/HDL:CHD Risk Coronary Heart Disease Risk Table                     Men   Women  1/2 Average Risk   3.4   3.3  Average Risk       5.0   4.4  2 X Average Risk    9.6   7.1  3 X Average Risk  23.4   11.0        Use the calculated Patient Ratio above and the CHD Risk Table to determine the patient's CHD Risk.        ATP III CLASSIFICATION (LDL):  <100     mg/dL   Optimal  100-129  mg/dL   Near or Above                    Optimal  130-159  mg/dL   Borderline  160-189  mg/dL   High  >190     mg/dL   Very High Performed at [facility] [address] [place], [place] [address]   TSH     Status: None   Collection Time: 02/01/19  6:53 AM  Result Value Ref Range   TSH 2.812 0.350 - 4.500 uIU/mL    Comment: Performed by a 3rd Generation assay with a functional sensitivity of <=0.01 uIU/mL. Performed at [facility], [place] [address], [place], [place] [address]     Blood Alcohol level:  Lab Results  Component Value Date   ETH 309 [facility]) 42/68/3419    Metabolic Disorder Labs:  Lab Results  Component Value Date   HGBA1C 4.7 (L) 02/01/2019   MPG 88.19 02/01/2019   No results found for: PROLACTIN Lab Results  Component Value Date   CHOL 192 02/01/2019   TRIG 65 02/01/2019   HDL 87 02/01/2019   CHOLHDL 2.2 02/01/2019   VLDL 13 02/01/2019   LDLCALC 92 02/01/2019    Current Medications: Current Facility-Administered Medications  Medication Dose Route Frequency Provider Last Rate Last Dose  . acetaminophen (TYLENOL) tablet 650 mg  650 mg Oral Q6H PRN Ethelene Hal, NP      . alum & mag hydroxide-simeth (MAALOX/MYLANTA) 200-200-20 MG/5ML suspension 30 mL  30 mL Oral Q4H PRN Ethelene Hal, NP      . hydrOXYzine (ATARAX/VISTARIL) tablet 25 mg  25 mg Oral Q6H PRN Mikya Don,  Myer Peer, MD      . loperamide (IMODIUM) capsule 2-4 mg  2-4 mg Oral PRN Kamarion Zagami, Myer Peer, MD      . LORazepam (ATIVAN) tablet 1 mg  1 mg Oral QID Moya Duan, Myer Peer, MD   1 mg at 02/01/19 0912   Followed by  . [START ON 02/02/2019] LORazepam (ATIVAN) tablet 1 mg  1 mg Oral TID Rennae Ferraiolo, Myer Peer, MD       Followed by  .  [START ON 02/03/2019] LORazepam (ATIVAN) tablet 1 mg  1 mg Oral BID Syeda Prickett, Myer Peer, MD       Followed by  . [START ON 02/04/2019] LORazepam (ATIVAN) tablet 1 mg  1 mg Oral Daily Yasha Tibbett A, MD      . magnesium hydroxide (MILK OF MAGNESIA) suspension 30 mL  30 mL Oral Daily PRN Ethelene Hal, NP      . multivitamin with minerals tablet 1 tablet  1 tablet Oral Daily Unique Sillas, Myer Peer, MD   1 tablet at 02/01/19 0912  . ondansetron (ZOFRAN-ODT) disintegrating tablet 4 mg  4 mg Oral Q6H PRN Assad Harbeson A, MD      . thiamine (VITAMIN B-1) tablet 100 mg  100 mg Oral Daily Suhani Stillion, Myer Peer, MD   100 mg at 02/01/19 0912  . traZODone (DESYREL) tablet 100 mg  100 mg Oral QHS Ethelene Hal, NP   100 mg at 01/31/19 2206   PTA Medications: Medications Prior to Admission  Medication Sig Dispense Refill Last Dose  . albuterol (PROVENTIL HFA;VENTOLIN HFA) 108 (90 Base) MCG/ACT inhaler Inhale 2 puffs into the lungs every 6 (six) hours as needed for wheezing or shortness of breath. (Patient not taking: Reported on 01/30/2019) 54 g 3 Completed Course at Unknown time  . Ca Carbonate-Mag Hydroxide (ROLAIDS) 550-110 MG CHEW Chew 1 tablet by mouth daily as needed (indigestion).   Past Month at Unknown time  . cetirizine (ZYRTEC) 10 MG tablet Take 1 tablet (10 mg total) by mouth daily. (Patient not taking: Reported on 01/30/2019) 30 tablet 11 Completed Course at Unknown time  . fluticasone (FLONASE) 50 MCG/ACT nasal spray PLACE 2 SPRAYS INTO BOTH NOSTRILS DAILY. (Patient not taking: Reported on 01/30/2019) 16 g 2 Completed Course at Unknown time  . nadolol (CORGARD) 20 MG tablet TAKE 1 TABLET BY MOUTH DAILY. (Patient not taking: Reported on 01/30/2019) 30 tablet 5 Completed Course at Unknown time  . naproxen sodium (ALEVE) 220 MG tablet Take 220 mg by mouth daily as needed (pain).   01/30/2019 at Unknown time  . omeprazole (PRILOSEC) 20 MG capsule TAKE 1 CAPSULE BY MOUTH TWICE DAILY 30 MINUTES  BEFORE MEALS FOR 3 MONTHS AND THEN 1 CAPSULE DAILY. (Patient not taking: Reported on 01/30/2019) 60 capsule 11 Completed Course at Unknown time  . sertraline (ZOLOFT) 50 MG tablet Take 50 mg by mouth daily.        Musculoskeletal: Strength & Muscle Tone: within normal limits Gait & Station: normal Patient leans: N/A  Psychiatric Specialty Exam: Physical Exam  Review of Systems  Constitutional: Negative.   Eyes: Negative.   Respiratory: Negative.   Cardiovascular: Negative.   Gastrointestinal: Negative.   Genitourinary: Negative.   Musculoskeletal: Negative.   Skin: Negative.   Neurological: Negative for seizures and headaches.  Endo/Heme/Allergies: Negative.   Psychiatric/Behavioral: Positive for depression and substance abuse.  All other systems reviewed and are negative.   Blood pressure (!) 147/75, pulse 95, temperature 98.2 F (36.8 C), temperature  source Oral, resp. rate 18, height 5\' 10"  (1.778 m), weight 98.9 kg, SpO2 97 %.Body mass index is 31.28 kg/m.  General Appearance: Fairly Groomed  Eye Contact:  Fair  Speech:  Normal Rate  Volume:  Normal  Mood:  reports mood is " OK", minimizes depression at this time  Affect:  subtly irritalbe, does smile briefly at times   Thought Process:  Linear and Descriptions of Associations: Intact  Orientation:  Other:  presents fully alert and attentive. He is oriented to March 2020, [place], [place]  Thought Content:  Denies hallucinations,no delusions, not internally preoccupied at this time  Suicidal Thoughts:  No currently denies suicidal or self injurious ideations, denies homicidal or violent ideations, contracts for safety on unit   Homicidal Thoughts:  No  Memory:  recent and remote fair   Judgement:  Fair  Insight:  Fair  Psychomotor Activity:  Normal- no psychomotor agitation or restlessness , no diaphoresis, does not appear in any acute distress  Concentration:  Concentration: Fair and Attention Span: Fair  Recall:  [employer] of Knowledge:  Fair  Language:  Fair  Akathisia:  Negative  Handed:  Right  AIMS (if indicated):     Assets:  Communication Skills Desire for Improvement Resilience  ADL's:  Intact  Cognition:  WNL  Sleep:  Number of Hours: 6.75    Treatment Plan Summary: Daily contact with patient to assess and evaluate symptoms and progress in treatment, Medication management, Plan inpatient treatment and medications as below  Observation Level/Precautions:  15 minute checks  Laboratory:  As needed   Psychotherapy:  Milieu, group therapy   Medications:  Ativan detox protocol to minimize withdrawal symptoms. We discussed standing psychiatric medications - patient currently denies/minimizes depression and describes mostly as substance induced . Not currently interested in a standing psychiatric medication. For now, will continue to monitor. Would consider starting antidepressant if patient 's mood does not improve further with abstinence   Consultations: as noted   Discharge Concerns:  -  Estimated LOS:4-5 days   Other:     Physician Treatment Plan for Primary Diagnosis:  Alcohol Dependence, Alcohol Induced Mood Disorder, Suicidal Ideations Long Term Goal(s): Improvement in symptoms so as ready for discharge  Short Term Goals: Ability to identify changes in lifestyle to reduce recurrence of condition will improve and Ability to maintain clinical measurements within normal limits will improve  Physician Treatment Plan for Secondary Diagnosis: As above Long Term Goal(s): Improvement in symptoms so as ready for discharge  Short Term Goals: Ability to identify changes in lifestyle to reduce recurrence of condition will improve, Ability to verbalize feelings will improve, Ability to disclose and discuss suicidal ideas, Ability to demonstrate self-control will improve and Ability to identify and develop effective coping behaviors will improve  I certify that inpatient services furnished can  reasonably be expected to improve the patient's condition.    Jenne Campus, MD 3/15/20201:12 PM

## 2019-02-01 NOTE — [facility] Group Notes (Signed)
[facility] LCSW Group Therapy Note  Date/Time:  02/01/2019 9:00-10:00 or 10:00-11:00AM  Type of Therapy and Topic:  Group Therapy:  Healthy and Unhealthy Supports  Participation Level:  Did Not Attend   Description of Group:  Patients in this group were introduced to the idea of adding a variety of healthy supports to address the various needs in their lives.Patients discussed what additional healthy supports could be helpful in their recovery and wellness after discharge in order to prevent future hospitalizations.   An emphasis was placed on using counselor, doctor, therapy groups, 12-step groups, and problem-specific support groups to expand supports.  They also worked as a group on developing a specific plan for several patients to deal with unhealthy supports through boundary-setting, psychoeducation with loved ones, and even termination of relationships.   Therapeutic Goals:   1)  discuss importance of adding supports to stay well once out of the hospital  2)  compare healthy versus unhealthy supports and identify some examples of each  3)  generate ideas and descriptions of healthy supports that can be added  4)  offer mutual support about how to address unhealthy supports  5)  encourage active participation in and adherence to discharge plan    Summary of Patient Progress:  The patient did not attend Therapeutic Modalities:   Motivational Interviewing Brief Solution-Focused Therapy  Riggin Cuttino D Laketa Sandoz         

## 2019-02-01 NOTE — Progress Notes (Signed)
D Pt is seen OOB UAL on the 300 hall today. He endorses a flat, irritable affect. He clearly is not engaged in his sobriety as evidenced by his statement to this Probation officer" I dont want medicine...ibuprofen wnana know when Im going to get the helloutta here".     A He completed his daily assessment and on this he wrote he denied SI today and he rated his depression, hopelessness and anxiety "0/0/0/", respectively. He takes his medications.     R Safety maintained.

## 2019-02-01 NOTE — Plan of Care (Signed)
  Problem: Education: Goal: Knowledge of [place] [facility] Education information/materials will improve Outcome: Not Progressing Goal: Emotional status will improve Outcome: Not Progressing Goal: Mental status will improve Outcome: Not Progressing   

## 2019-02-02 DIAGNOSIS — F1994 Other psychoactive substance use, unspecified with psychoactive substance-induced mood disorder: Secondary | ICD-10-CM

## 2019-02-02 NOTE — [facility] Counselor (Signed)
Adult Comprehensive Assessment  Patient ID: Kevin Nicholson, male   DOB: 1960-11-26, 58 y.o.   MRN: [id number]  Information Source: Information source: Patient  Current Stressors:  Patient states their primary concerns and needs for treatment are:: Discharge Patient states their goals for this hospitilization and ongoing recovery are:: Discharge, declines follow up Educational / Learning stressors: Denies Employment / Job issues: Denies, on disabiltiy Family Relationships: Denies, good relationships. Financial / Lack of resources (include bankruptcy): Denies, on [employer] / Lack of housing: Staying with sister and brother in [place], reports he will return there when he leaves [facility] Physical health (include injuries & life threatening diseases): Liver issues, says this is why he is on disabilty. Intensinal issues.  Social relationships: Denies Substance abuse: Daily ETOH use, doesn't want treatment.  Bereavement / Loss: Recent break up  Living/Environment/Situation:  Living Arrangements: Other relatives Living conditions (as described by patient or guardian): Lives with sister and brother in law in [place] Who else lives in the home?: Sister and brother in law How long has patient lived in current situation?: "not long" What is atmosphere in current home: Comfortable  Family History:  Marital status: Single Are you sexually active?: No What is your sexual orientation?: Straight Has your sexual activity been affected by drugs, alcohol, medication, or emotional stress?: denies Does patient have children?: No  Childhood History:  By whom was/is the patient raised?: Grandparents Additional childhood history information: Raised by maternal grandparents Description of patient's relationship with caregiver when they were a child: "Loved them to death" Patient's description of current relationship with people who raised him/her: Deceased How were you disciplined when you got in  trouble as a child/adolescent?: appropriate Does patient have siblings?: Yes Number of Siblings: 2 Description of patient's current relationship with siblings: Sister, good relationships. Brother, don't talk often  Did patient suffer any verbal/emotional/physical/sexual abuse as a child?: No Did patient suffer from severe childhood neglect?: No Has patient ever been sexually abused/assaulted/raped as an adolescent or adult?: No Was the patient ever a victim of a crime or a disaster?: No Witnessed domestic violence?: No Has patient been effected by domestic violence as an adult?: No  Education:  Highest grade of school patient has completed: 8th grade Currently a student?: No Learning disability?: No  Employment/Work Situation:   Employment situation: On disability Why is patient on disability: Liver problems How long has patient been on disability: 4-5 years Patient's job has been impacted by current illness: No What is the longest time patient has a held a job?: 30+ years 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, Architect Did You Receive Any Psychiatric Treatment/Services While in Passenger transport manager?: No Are There Guns or Other Weapons in [place]?: No  Financial Resources:   Museum/gallery curator resources: Teacher, early years/pre, [employer], Medicaid Does patient have a Programmer, applications or guardian?: No  Alcohol/Substance Abuse:   What has been your use of drugs/alcohol within the last 12 months?: Drinks ETOH, can go weeks without, often binge drinks If attempted suicide, did drugs/alcohol play a role in this?: No Alcohol/Substance Abuse Treatment Hx: Denies past history Has alcohol/substance abuse ever caused legal problems?: Yes(DUIs many many years ago)  Social Support System:   Patient's Community Support System: Good Describe Community Support System: Sister and brother in law Type of faith/religion: Not really How does patient's faith help to cope with current  illness?: n/a  Leisure/Recreation:   Leisure and Hobbies: Fishing, walking around the river, caring for livestock  Strengths/Needs:   What is the patient's perception of their strengths?: Fishing, hunting Patient states they can use these personal strengths during their treatment to contribute to their recovery: unsure Patient states these barriers may affect/interfere with their treatment: denies Patient states these barriers may affect their return to the community: denies  Discharge Plan:   Currently receiving community mental health services: No Patient states concerns and preferences for aftercare planning are: Does not want follow up Patient states they will know when they are safe and ready for discharge when: Feels safe now Does patient have access to transportation?: Yes Does patient have financial barriers related to discharge medications?: No Patient description of barriers related to discharge medications: Medicaid and Medicare per patient  Will patient be returning to same living situation after discharge?: Yes  Summary/Recommendations:   Summary and Recommendations (to be completed by the evaluator): Kevin Nicholson is a 58 year old male presenting to [facility] under IVC petitioned by [facility] for stating SI without plan while intoxicated. Patient minimizes ETOH use and is focused on discharge. Patient declines referrals for residential and outpatient treatment. He reports he is eager to return to his sister's house. Patient reports his primary stressor is "being here [facility])." Patient will benefit from crisis stabilization, medication management, therapeutic milieu, and referrals for services.   Joellen Jersey. 02/02/2019

## 2019-02-02 NOTE — [facility] Suicide Risk Assessment (Signed)
[place] INPATIENT:  Family/Significant Other Suicide Prevention Education  Suicide Prevention Education:  Patient Refusal for Family/Significant Other Suicide Prevention Education: The patient Kevin Nicholson has refused to provide written consent for family/significant other to be provided Family/Significant Other Suicide Prevention Education during admission and/or prior to discharge.  Physician notified.   SPE reviewed with patient. Denies SI.  Joellen Jersey 02/02/2019, 2:19 PM

## 2019-02-02 NOTE — Progress Notes (Signed)
Recreation Therapy Notes  Date:  3.16.20 Time: 0930 Location: 300 Hall Dayroom  Group Topic: Stress Management  Goal Area(s) Addresses:  Patient will identify positive stress management techniques. Patient will identify benefits of using stress management post d/c.  Intervention: Stress Management  Activity :  Guided Imagery.  LRT introduced the stress management technique of guided imagery.  LRT read a script that patients on a journey at the beach to envision the peaceful waves.  Patients were to listen and follow along as script was read.  Education:  Stress Management, Discharge Planning.   Education Outcome: Acknowledges Education  Clinical Observations/Feedback:  Pt did not attend group.     Victorino Sparrow, LRT/CTRS      Victorino Sparrow A 02/02/2019 10:41 AM

## 2019-02-02 NOTE — Progress Notes (Signed)
[facility] MD Progress Note  02/02/2019 11:18 AM Kevin Nicholson  MRN:  [id number] Subjective: Patient reports he wants to go home is not sure why he is here when reminded of his blood alcohol level states that there probably was alcohol in his system.  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  He denies wanting to harm self as mentioned.  He denies cravings   Principal Problem:  Diagnosis: Active Problems:   Substance induced mood disorder (HCC)  Total Time spent with patient: 20 minutes  Past Medical History:  Past Medical History:  Diagnosis Date  . Ascites   . 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 Hypertension   . Shortness of breath dyspnea    with stair climbing  . Ventral hernia     Past Surgical History:  Procedure Laterality Date  . BACK SURGERY  2000  . BIOPSY N/A 09/13/2015   Procedure: BIOPSY;  Surgeon: Danie Binder, MD;  Location: [facility] ORS;  Service: Endoscopy;  Laterality: N/A;  gastric,   . BIOPSY  01/03/2016   Procedure: BIOPSY;  Surgeon: Danie Binder, MD;  Location: [facility];  Service: Endoscopy;;  gastric bx's  . COLONOSCOPY WITH PROPOFOL N/A 01/03/2016   Dr. Oneida Alar: 2 simple adenomas, 9 hyperplastic polyps, colonoscopy in 5 years   . ESOPHAGOGASTRODUODENOSCOPY (EGD) WITH PROPOFOL N/A 09/13/2015   Dr. 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 +H.pylori . Surveillance due in Jan 2017 for ulcer healing  . ESOPHAGOGASTRODUODENOSCOPY (EGD) WITH PROPOFOL N/A 01/03/2016   Dr. Oneida Alar: 2 columns of Grade 1 varices, mild gastropathy, mild gastritis   . ESOPHAGOGASTRODUODENOSCOPY (EGD) WITH PROPOFOL N/A 03/05/2017   Procedure: ESOPHAGOGASTRODUODENOSCOPY (EGD) WITH PROPOFOL;  Surgeon: Danie Binder, MD;  Location: [facility];  Service: Endoscopy;   Laterality: N/A;  9:45 AM  . 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 POLYPECTOMY  01/03/2016   Procedure: POLYPECTOMY;  Surgeon: Danie Binder, MD;  Location: [facility];  Service: Endoscopy;;  transverse colon polyps x2, sigmoid colon polyps x4, rectal polyps x5  . UMBILICAL HERNIA REPAIR N/A 03/11/2016   Procedure: laparoscopy, open HERNIA REPAIR UMBILICAL ADULT, small bowel resection ;  Surgeon: Leighton Ruff, MD;  Location: [facility];  Service: General;  Laterality: N/A;   Family History:  Family History  Problem Relation Age of Onset  . Cancer Mother   . Cancer Father   . Colon cancer Neg Hx   . Liver disease Neg Hx     Social History:  Social History   Substance and Sexual Activity  Alcohol Use Yes  . Alcohol/week: 0.0 standard drinks   Comment: former heavy drinker, stopped in March 2016     Social History   Substance and Sexual Activity  Drug Use No    Social History   Socioeconomic History  . Marital status: Single    Spouse name: Not on file  . Number of children: Not on file  . Years of education: Not on file  . Highest education level: Not on file  Occupational History  . Not on file  Social Needs  . Financial resource strain: Not on file  . Food  insecurity:    Worry: Not on file    Inability: Not on file  . Transportation needs:    Medical: Not on file    Non-medical: Not on file  Tobacco Use  . Smoking status: Current Every Day Smoker    Packs/day: 1.00    Years: 45.00    Pack years: 45.00    Types: Cigarettes  . Smokeless tobacco: Never Used  Substance and Sexual Activity  . Alcohol use: Yes    Alcohol/week: 0.0 standard drinks    Comment: former heavy drinker, stopped in March 2016  . Drug use: No  . Sexual activity: Not Currently  Lifestyle  . Physical activity:    Days per week: Not on file    Minutes per session: Not on file  . Stress: Not on file  Relationships  . Social connections:    Talks  on phone: Not on file    Gets together: Not on file    Attends religious service: Not on file    Active member of club or organization: Not on file    Attends meetings of clubs or organizations: Not on file    Relationship status: Not on file  Other Topics Concern  . Not on file  Social History Narrative  . Not on file   Sleep: Good  Appetite:  Good  Current Medications: Current Facility-Administered Medications  Medication Dose Route Frequency Provider Last Rate Last Dose  . acetaminophen (TYLENOL) tablet 650 mg  650 mg Oral Q6H PRN Ethelene Hal, NP      . alum & mag hydroxide-simeth (MAALOX/MYLANTA) 200-200-20 MG/5ML suspension 30 mL  30 mL Oral Q4H PRN Ethelene Hal, NP      . hydrOXYzine (ATARAX/VISTARIL) tablet 25 mg  25 mg Oral Q6H PRN Cobos, Myer Peer, MD      . loperamide (IMODIUM) capsule 2-4 mg  2-4 mg Oral PRN Cobos, Myer Peer, MD      . LORazepam (ATIVAN) tablet 1 mg  1 mg Oral TID Cobos, Myer Peer, MD       Followed by  . [START ON 02/03/2019] LORazepam (ATIVAN) tablet 1 mg  1 mg Oral BID Cobos, Myer Peer, MD       Followed by  . [START ON 02/04/2019] LORazepam (ATIVAN) tablet 1 mg  1 mg Oral Daily Cobos, Fernando A, MD      . magnesium hydroxide (MILK OF MAGNESIA) suspension 30 mL  30 mL Oral Daily PRN Ethelene Hal, NP      . multivitamin with minerals tablet 1 tablet  1 tablet Oral Daily Cobos, Myer Peer, MD   1 tablet at 02/01/19 0912  . ondansetron (ZOFRAN-ODT) disintegrating tablet 4 mg  4 mg Oral Q6H PRN Cobos, Fernando A, MD      . thiamine (VITAMIN B-1) tablet 100 mg  100 mg Oral Daily Cobos, Myer Peer, MD   100 mg at 02/01/19 0912  . traZODone (DESYREL) tablet 50 mg  50 mg Oral QHS PRN Cobos, Myer Peer, MD        Lab Results:  Results for orders placed or performed during the hospital encounter of 01/31/19 (from the past 48 hour(s))  Hemoglobin A1c     Status: Abnormal   Collection Time: 02/01/19  6:53 AM  Result Value Ref  Range   Hgb A1c MFr Bld 4.7 (L) 4.8 - 5.6 %    Comment: (NOTE) Pre diabetes:          5.7%-6.4% Diabetes:              >  6.4% Glycemic control for   <7.0% adults with diabetes    Mean Plasma Glucose 88.19 mg/dL    Comment: Performed at [facility] [address], [place], [place] [address]  Lipid panel     Status: None   Collection Time: 02/01/19  6:53 AM  Result Value Ref Range   Cholesterol 192 0 - 200 mg/dL   Triglycerides 65 <150 mg/dL   HDL 87 >40 mg/dL   Total CHOL/HDL Ratio 2.2 RATIO   VLDL 13 0 - 40 mg/dL   LDL Cholesterol 92 0 - 99 mg/dL    Comment:        Total Cholesterol/HDL:CHD Risk Coronary Heart Disease Risk Table                     Men   Women  1/2 Average Risk   3.4   3.3  Average Risk       5.0   4.4  2 X Average Risk   9.6   7.1  3 X Average Risk  23.4   11.0        Use the calculated Patient Ratio above and the CHD Risk Table to determine the patient's CHD Risk.        ATP III CLASSIFICATION (LDL):  <100     mg/dL   Optimal  100-129  mg/dL   Near or Above                    Optimal  130-159  mg/dL   Borderline  160-189  mg/dL   High  >190     mg/dL   Very High Performed at [facility] [address], [place], [place] [address]   TSH     Status: None   Collection Time: 02/01/19  6:53 AM  Result Value Ref Range   TSH 2.812 0.350 - 4.500 uIU/mL    Comment: Performed by a 3rd Generation assay with a functional sensitivity of <=0.01 uIU/mL. Performed at [facility], [place] [address], [place], [place] [address]     Blood Alcohol level:  Lab Results  Component Value Date   ETH [facility]) 31/54/0086    Metabolic Disorder Labs: Lab Results  Component Value Date   HGBA1C 4.7 (L) 02/01/2019   MPG 88.19 02/01/2019   No results found for: PROLACTIN Lab Results  Component Value Date   CHOL 192 02/01/2019   TRIG 65 02/01/2019   HDL 87 02/01/2019   CHOLHDL 2.2 02/01/2019   VLDL 13 02/01/2019    LDLCALC 92 02/01/2019    Physical Findings: AIMS: Facial and Oral Movements Muscles of Facial Expression: None, normal Lips and Perioral Area: None, normal Jaw: None, normal Tongue: None, normal,Extremity Movements Upper (arms, wrists, hands, fingers): None, normal Lower (legs, knees, ankles, toes): None, normal, Trunk Movements Neck, shoulders, hips: None, normal, Overall Severity Severity of abnormal movements (highest score from questions above): None, normal Incapacitation due to abnormal movements: None, normal Patient's awareness of abnormal movements (rate only patient's report): No Awareness, Dental Status Current problems with teeth and/or dentures?: No Does patient usually wear dentures?: No  CIWA:  CIWA-Ar Total: 2 COWS:     Musculoskeletal: Strength & Muscle Tone: within normal limits Gait & Station: normal Patient leans: N/A  Psychiatric Specialty Exam: Physical Exam  ROS  Blood pressure 128/62, pulse 73, temperature 98.2 F (36.8 C), temperature source Oral, resp. rate 18, height 5\' 10"  (1.778 m), weight  98.9 kg, SpO2 97 %.Body mass index is 31.28 kg/m.  General Appearance: Casual  Eye Contact:  Good  Speech:  Clear and Coherent  Volume:  Decreased  Mood:  Dysphoric  Affect:  Constricted  Thought Process:  Coherent  Orientation:  Full (Time, Place, and Person)  Thought Content:  Tangential  Suicidal Thoughts:  No  Homicidal Thoughts:  No  Memory:  Immediate;   Fair  Judgement:  Fair  Insight:  Fair  Psychomotor Activity:  Decreased  Concentration:  Concentration: Fair  Recall:  [employer] of Knowledge:  Fair  Language:  Fair  Akathisia:  Negative  Handed:  Right  AIMS (if indicated):     Assets:  Physical Health  ADL's:  Intact  Cognition:  WNL  Sleep:  Number of Hours: 6.75     Treatment Plan Summary: Daily contact with patient to assess and evaluate symptoms and progress in treatment and Medication management continue detox measures and  protocol lacking insight is his biggest hurdle to long-term recovery but we will probably discharge tomorrow if he still reporting no thoughts of harming self or others  Johnn Hai, MD 02/02/2019, 11:18 AM

## 2019-02-02 NOTE — Progress Notes (Signed)
Pt invited to group.  Did not attend   spiritual care group on grief and loss facilitated by chaplain Jerene Pitch and PhD counseling intern, Kerry Hough   Group opened with brief discussion and psycho-social ed around grief and loss in relationships and in relation to self - identifying life patterns, circumstances, changes that cause losses. Established group norm of speaking from own life experience. Group goal of establishing open and affirming space for members to share loss and experience with grief, normalize grief experience and provide psycho social education and grief support.  Group drew on Worden's four tasks of grief

## 2019-02-02 NOTE — Tx Team (Signed)
Interdisciplinary Treatment and Diagnostic Plan Update  02/02/2019 Time of Session: 9:00am Kevin Nicholson MRN: [id number]  Principal Diagnosis: <principal problem not specified>  Secondary Diagnoses: Active Problems:   Substance induced mood disorder (HCC)   Current Medications:  Current Facility-Administered Medications  Medication Dose Route Frequency Provider Last Rate Last Dose  . acetaminophen (TYLENOL) tablet 650 mg  650 mg Oral Q6H PRN Ethelene Hal, NP      . alum & mag hydroxide-simeth (MAALOX/MYLANTA) 200-200-20 MG/5ML suspension 30 mL  30 mL Oral Q4H PRN Ethelene Hal, NP      . hydrOXYzine (ATARAX/VISTARIL) tablet 25 mg  25 mg Oral Q6H PRN Cobos, Myer Peer, MD      . loperamide (IMODIUM) capsule 2-4 mg  2-4 mg Oral PRN Cobos, Myer Peer, MD      . LORazepam (ATIVAN) tablet 1 mg  1 mg Oral TID Cobos, Myer Peer, MD       Followed by  . [START ON 02/03/2019] LORazepam (ATIVAN) tablet 1 mg  1 mg Oral BID Cobos, Myer Peer, MD       Followed by  . [START ON 02/04/2019] LORazepam (ATIVAN) tablet 1 mg  1 mg Oral Daily Cobos, Fernando A, MD      . magnesium hydroxide (MILK OF MAGNESIA) suspension 30 mL  30 mL Oral Daily PRN Ethelene Hal, NP      . multivitamin with minerals tablet 1 tablet  1 tablet Oral Daily Cobos, Myer Peer, MD   1 tablet at 02/01/19 0912  . ondansetron (ZOFRAN-ODT) disintegrating tablet 4 mg  4 mg Oral Q6H PRN Cobos, Fernando A, MD      . thiamine (VITAMIN B-1) tablet 100 mg  100 mg Oral Daily Cobos, Myer Peer, MD   100 mg at 02/01/19 0912  . traZODone (DESYREL) tablet 50 mg  50 mg Oral QHS PRN Cobos, Myer Peer, MD       PTA Medications: Medications Prior to Admission  Medication Sig Dispense Refill Last Dose  . albuterol (PROVENTIL HFA;VENTOLIN HFA) 108 (90 Base) MCG/ACT inhaler Inhale 2 puffs into the lungs every 6 (six) hours as needed for wheezing or shortness of breath. (Patient not taking: Reported on 01/30/2019) 54 g 3  Completed Course at Unknown time  . Ca Carbonate-Mag Hydroxide (ROLAIDS) 550-110 MG CHEW Chew 1 tablet by mouth daily as needed (indigestion).   Past Month at Unknown time  . cetirizine (ZYRTEC) 10 MG tablet Take 1 tablet (10 mg total) by mouth daily. (Patient not taking: Reported on 01/30/2019) 30 tablet 11 Completed Course at Unknown time  . fluticasone (FLONASE) 50 MCG/ACT nasal spray PLACE 2 SPRAYS INTO BOTH NOSTRILS DAILY. (Patient not taking: Reported on 01/30/2019) 16 g 2 Completed Course at Unknown time  . nadolol (CORGARD) 20 MG tablet TAKE 1 TABLET BY MOUTH DAILY. (Patient not taking: Reported on 01/30/2019) 30 tablet 5 Completed Course at Unknown time  . naproxen sodium (ALEVE) 220 MG tablet Take 220 mg by mouth daily as needed (pain).   01/30/2019 at Unknown time  . omeprazole (PRILOSEC) 20 MG capsule TAKE 1 CAPSULE BY MOUTH TWICE DAILY 30 MINUTES BEFORE MEALS FOR 3 MONTHS AND THEN 1 CAPSULE DAILY. (Patient not taking: Reported on 01/30/2019) 60 capsule 11 Completed Course at Unknown time  . sertraline (ZOLOFT) 50 MG tablet Take 50 mg by mouth daily.        Patient Stressors: Substance abuse  Patient Strengths: Armed forces logistics/support/administrative officer Supportive family/friends  Treatment Modalities: Medication Management,  Group therapy, Case management,  1 to 1 session with clinician, Psychoeducation, Recreational therapy.   Physician Treatment Plan for Primary Diagnosis: <principal problem not specified> Long Term Goal(s): Improvement in symptoms so as ready for discharge Improvement in symptoms so as ready for discharge   Short Term Goals: Ability to identify changes in lifestyle to reduce recurrence of condition will improve Ability to maintain clinical measurements within normal limits will improve Ability to identify changes in lifestyle to reduce recurrence of condition will improve Ability to verbalize feelings will improve Ability to disclose and discuss suicidal ideas Ability to demonstrate  self-control will improve Ability to identify and develop effective coping behaviors will improve  Medication Management: Evaluate patient's response, side effects, and tolerance of medication regimen.  Therapeutic Interventions: 1 to 1 sessions, Unit Group sessions and Medication administration.  Evaluation of Outcomes: Not Met  Physician Treatment Plan for Secondary Diagnosis: Active Problems:   Substance induced mood disorder ([place])  Long Term Goal(s): Improvement in symptoms so as ready for discharge Improvement in symptoms so as ready for discharge   Short Term Goals: Ability to identify changes in lifestyle to reduce recurrence of condition will improve Ability to maintain clinical measurements within normal limits will improve Ability to identify changes in lifestyle to reduce recurrence of condition will improve Ability to verbalize feelings will improve Ability to disclose and discuss suicidal ideas Ability to demonstrate self-control will improve Ability to identify and develop effective coping behaviors will improve     Medication Management: Evaluate patient's response, side effects, and tolerance of medication regimen.  Therapeutic Interventions: 1 to 1 sessions, Unit Group sessions and Medication administration.  Evaluation of Outcomes: Not Met   RN Treatment Plan for Primary Diagnosis: <principal problem not specified> Long Term Goal(s): Knowledge of disease and therapeutic regimen to maintain health will improve  Short Term Goals: Ability to demonstrate self-control, Ability to identify and develop effective coping behaviors will improve and Compliance with prescribed medications will improve  Medication Management: RN will administer medications as ordered by provider, will assess and evaluate patient's response and provide education to patient for prescribed medication. RN will report any adverse and/or side effects to prescribing provider.  Therapeutic  Interventions: 1 on 1 counseling sessions, Psychoeducation, Medication administration, Evaluate responses to treatment, Monitor vital signs and CBGs as ordered, Perform/monitor CIWA, COWS, AIMS and Fall Risk screenings as ordered, Perform wound care treatments as ordered.  Evaluation of Outcomes: Not Met   LCSW Treatment Plan for Primary Diagnosis: <principal problem not specified> Long Term Goal(s): Safe transition to appropriate next level of care at discharge, Engage patient in therapeutic group addressing interpersonal concerns.  Short Term Goals: Engage patient in aftercare planning with referrals and resources, Increase social support, Increase emotional regulation, Identify triggers associated with mental health/substance abuse issues and Increase skills for wellness and recovery  Therapeutic Interventions: Assess for all discharge needs, 1 to 1 time with Social worker, Explore available resources and support systems, Assess for adequacy in community support network, Educate family and significant other(s) on suicide prevention, Complete Psychosocial Assessment, Interpersonal group therapy.  Evaluation of Outcomes: Not Met   Progress in Treatment: Attending groups: No. Participating in groups: No. Taking medication as prescribed: Yes. Toleration medication: Yes. Family/Significant other contact made: No, will contact:  supports if consents are granted Patient understands diagnosis: Yes. Discussing patient identified problems/goals with staff: No. Medical problems stabilized or resolved: Yes. Denies suicidal/homicidal ideation: Yes. Issues/concerns per patient self-inventory: No.  New problem(s) identified: No,  Describe:  CSW continuing to assess]  New Short Term/Long Term Goal(s): detox, medication management for mood stabilization; elimination of SI thoughts; development of comprehensive mental wellness/sobriety plan.  Patient Goals:  "Discharge." Not interested in outpatient  follow up at this time.   Discharge Plan or Barriers: CSW continuing to assess. [place] pamphlet, Mobile Crisis information, and AA/NA information provided to patient for additional community support and resources.   Reason for Continuation of Hospitalization: Anxiety Depression  Estimated Length of Stay: 3-5 days  Attendees: Patient: Kevin Nicholson 02/02/2019 11:21 AM  Physician: Queen Blossom 02/02/2019 11:21 AM  Nursing: Rise Paganini, [place] 02/02/2019 11:21 AM  RN Care Manager: 02/02/2019 11:21 AM  Social Worker: Stephanie Acre, [place] 02/02/2019 11:21 AM  Recreational Therapist:  02/02/2019 11:21 AM  Other:  02/02/2019 11:21 AM  Other:  02/02/2019 11:21 AM  Other: 02/02/2019 11:21 AM    Scribe for Treatment Team: Joellen Jersey, [place] 02/02/2019 11:21 AM

## 2019-02-02 NOTE — Progress Notes (Signed)
Writer spoke with patient 1:1 while group was going on. He did not attend group and was irritable reporting he was ready to leave. He reports that he not supposed to be here. Writer got him a cup of coffee while he looked at a magazine until group ended. He did sit up in the dayroom briefly and watched tv before returning to his room. Support given and safety maintained on unit with 15 min checks.

## 2019-02-02 NOTE — Plan of Care (Signed)
Progress note   D: pt found in bed; noncompliant with morning medication administration and assessments. Upon awakening at noon, the patient was complaint with his meds and assessments. Pt denies any physical pain or symptoms. Pt seems agitated and guarded. Pt is fixated on discharge. Pt denies si/hi/ah/vh and verbally agrees to approach staff if these become apparent or before harming herself/others while at [facility]. A: pt provided support and encouragement. Pt given medication per protocol and standing orders. Q29m safety checks implemented and continued.  R: pt safe on the unit. Will continue to monitor.   Pt progressing in the following metrics  Problem: Education: Goal: Verbalization of understanding the information provided will improve Outcome: Progressing   Problem: Activity: Goal: Sleeping patterns will improve Outcome: Progressing   Problem: Coping: Goal: Ability to verbalize frustrations and anger appropriately will improve Outcome: Progressing Goal: Ability to demonstrate self-control will improve Outcome: Progressing   Problem: Health Behavior/Discharge Planning: Goal: Compliance with treatment plan for underlying cause of condition will improve Outcome: Progressing

## 2019-02-03 MED ORDER — ADULT MULTIVITAMIN W/MINERALS CH: 1.0000 | ORAL_TABLET | Freq: Every day | ORAL | Status: AC

## 2019-02-03 NOTE — Progress Notes (Signed)
Patient ID: Kevin Nicholson, male   DOB: 1961-11-12, 58 y.o.   MRN: [id number]  D: Pt alert and oriented on the unit.   A: Education, support, and encouragement provided. Discharge summary, medications and follow up appointments reviewed with pt. Suicide prevention resources provided, including "My 3 App." Pt's belongings in locker #26 returned and belongings sheet signed.  R: Pt denies SI/HI, A/VH, pain, or any concerns at this time. Pt ambulatory on and off unit. Pt discharged to lobby.

## 2019-02-03 NOTE — [facility] Suicide Risk Assessment (Signed)
[facility] Discharge Suicide Risk Assessment   Principal Problem: Alcoholism/depression Discharge Diagnoses: Active Problems:   Substance induced mood disorder (HCC)   Total Time spent with patient: 45 minutes  Remains alert oriented cooperative answers questions minimally but adequately denies wanting to harm self states those thoughts and statements must of, only while impaired denies cravings tremors or withdrawal symptoms no seizure activity no psychosis contracting fully  Mental Status Per Nursing Assessment::   On Admission:  Suicidal ideation indicated by patient, Suicide plan, Self-harm thoughts  Demographic Factors:  Male  Loss Factors: Decrease in vocational status  Historical Factors: NA  Risk Reduction Factors:   Sense of responsibility to family and Religious beliefs about death  Continued Clinical Symptoms:  Alcohol/Substance Abuse/Dependencies  Cognitive Features That Contribute To Risk:  None    Suicide Risk:  Minimal: No identifiable suicidal ideation.  Patients presenting with no risk factors but with morbid ruminations; may be classified as minimal risk based on the severity of the depressive symptoms  Follow-up Information    Patient declines Follow up.   Contact information: Declines outpatient referrals          Plan Of Care/Follow-up recommendations:  Activity:  full  Kevin Bjorkman, MD 02/03/2019, 8:54 AM

## 2019-02-03 NOTE — Progress Notes (Signed)
DAR NOTE: Patient presents with anxious affect and depressed mood.  Denies pain, auditory and visual hallucinations.  Maintained on routine safety checks.  Medications given as prescribed.  Support and encouragement offered as needed.  Attended wrap up  group and participated. Patient observed socializing with peers in the dayroom.  Offered no complaint.

## 2019-02-03 NOTE — Progress Notes (Signed)
  [facility] Adult Case Management Discharge Plan :  Will you be returning to the same living situation after discharge:  Yes,  with sister At discharge, do you have transportation home?: Yes,  sister will pick him up at 4pm Do you have the ability to pay for your medications: Yes,  Medicare  Release of information consent forms completed and in the chart.  Patient to Follow up at: Follow-up Information    Patient declines Follow up.   Contact information: Declines outpatient referrals          Next level of care provider has access to [place] and Suicide Prevention discussed: Yes,  with patient.  Have you used any form of tobacco in the last 30 days? (Cigarettes, Smokeless Tobacco, Cigars, and/or Pipes): Yes  Has patient been referred to the Quitline?: Patient refused referral  Patient has been referred for addiction treatment: Pt. refused referral  Joellen Jersey, [place] 02/03/2019, 11:33 AM

## 2019-02-03 NOTE — Discharge Summary (Signed)
Physician Discharge Summary Note  Patient:  Kevin Nicholson is an 58 y.o., male MRN:  [id number] DOB:  06/30/1961 Patient phone:  [phone] (home)  Patient address:   [place] [address],  Total Time spent with patient: 15 minutes  Date of Admission:  01/31/2019 Date of Discharge: 02/03/19  Reason for Admission:  Suicidal ideation, alcohol dependence  Principal Problem: Substance induced mood disorder [facility]) Discharge Diagnoses: Principal Problem:   Substance induced mood disorder ([place])   Past Psychiatric History: Per admission H&P: no prior psychiatric admissions, states he has never attempted suicide, no history of self cutting or self injurious behaviors, denies history of psychosis. Denies history of severe or persistent depressive episodes in the past . Denies history of mania or hypomania. Reports prior history of panic attacks but states have not occurred in " a long time". Denies history of violence   Past Medical History:  Past Medical History:  Diagnosis Date  . Ascites   . 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 Hypertension   . Shortness of breath dyspnea    with stair climbing  . Ventral hernia     Past Surgical History:  Procedure Laterality Date  . BACK SURGERY  2000  . BIOPSY N/A 09/13/2015   Procedure: BIOPSY;  Surgeon: Danie Binder, MD;  Location: [facility] ORS;  Service: Endoscopy;  Laterality: N/A;  gastric,   . BIOPSY  01/03/2016   Procedure: BIOPSY;  Surgeon: Danie Binder, MD;  Location: [facility] END[facility] SU[facility];  Service: Endoscopy;;  gastric bx's  . COLONOSCOPY WITH PROPOFOL N/A 01/03/2016   Dr. Oneida Alar: 2 simple adenomas, 9 hyperplastic polyps, colonoscopy in 5 years   . ESOPHAGOGASTRODUODENOSCOPY (EGD) WITH PROPOFOL N/A 09/13/2015   Dr. 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 +H.pylori . Surveillance due in Jan 2017 for ulcer healing  . ESOPHAGOGASTRODUODENOSCOPY (EGD) WITH PROPOFOL N/A 01/03/2016   Dr. Oneida Alar: 2  columns of Grade 1 varices, mild gastropathy, mild gastritis   . ESOPHAGOGASTRODUODENOSCOPY (EGD) WITH PROPOFOL N/A 03/05/2017   Procedure: ESOPHAGOGASTRODUODENOSCOPY (EGD) WITH PROPOFOL;  Surgeon: Danie Binder, MD;  Location: [facility];  Service: Endoscopy;  Laterality: N/A;  9:45 AM  . 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 POLYPECTOMY  01/03/2016   Procedure: POLYPECTOMY;  Surgeon: Danie Binder, MD;  Location: [facility];  Service: Endoscopy;;  transverse colon polyps x2, sigmoid colon polyps x4, rectal polyps x5  . UMBILICAL HERNIA REPAIR N/A 03/11/2016   Procedure: laparoscopy, open HERNIA REPAIR UMBILICAL ADULT, small bowel resection ;  Surgeon: Leighton Ruff, MD;  Location: [facility];  Service: General;  Laterality: N/A;   Family History:  Family History  Problem Relation Age of Onset  . Cancer Mother   . Cancer Father   . Colon cancer Neg Hx   . Liver disease Neg Hx    Family Psychiatric  History: Per admission H&P: Reports brother has history of alcohol use disorder , no suicides in family  Social History:  Social History   Substance and Sexual Activity  Alcohol Use Yes  . Alcohol/week: 0.0 standard drinks   Comment: former heavy drinker, stopped in March 2016     Social History   Substance and Sexual Activity  Drug Use No    Social History   Socioeconomic History  . Marital status: Single    Spouse name: Not on file  .  Number of children: Not on file  . Years of education: Not on file  . Highest education level: Not on file  Occupational History  . Not on file  Social Needs  . Financial resource strain: Not on file  . Food insecurity:    Worry: Not on file    Inability: Not on file  . Transportation needs:    Medical: Not on file    Non-medical: Not on file  Tobacco Use  . Smoking status: Current Every Day Smoker    Packs/day: 1.00    Years: 45.00    Pack years: 45.00    Types: Cigarettes  . Smokeless  tobacco: Never Used  Substance and Sexual Activity  . Alcohol use: Yes    Alcohol/week: 0.0 standard drinks    Comment: former heavy drinker, stopped in March 2016  . Drug use: No  . Sexual activity: Not Currently  Lifestyle  . Physical activity:    Days per week: Not on file    Minutes per session: Not on file  . Stress: Not on file  Relationships  . Social connections:    Talks on phone: Not on file    Gets together: Not on file    Attends religious service: Not on file    Active member of club or organization: Not on file    Attends meetings of clubs or organizations: Not on file    Relationship status: Not on file  Other Topics Concern  . Not on file  Social History Narrative  . Not on file    Hospital Course:  Per admission H&P 02/01/2019: 58 year old male . He  presented to ED on 3/13 under IVC petitioned by [facility], reporting that  Patient has a history of depression, alcohol abuse, and had made suicidal suicidal statements. Currently patient states " I really don't know what I said or told them. I was drunk  at the time" . Patient is unsure of the events that led to admission, but does remember he called an ambulance because " I wanted to stop drinking and I was too drunk to drive to the hospital" He reports he has been drinking " almost every day" over the last 1-2 weeks,  usually up to a fifth of bourbon a day. States his drinking pattern is variable and explains that he can go several days/weeks without alcohol. States he does " shake and sweat for a little bit" when he stops drinking. At present minimizes severe or significant depression prior to admission, and states " I was feeling OK". Currently denies significant depression and denies any SI. He reports " I am feeling OK, I 'm hoping I can go home tomorrow" Currently denies suicidal ideations, and minimizes severe neuro-vegetative symptoms.  Admission BAL 309, admission UDs negative.  [name] was admitted involuntarily after making suicidal statements while intoxicated with alcohol. Ativan CIWA protocol was started for alcohol withdrawal. Patient denied SI/HI/AVH throughout admission. Patient denied history of depression during periods of abstinence from alcohol. Patient minimized his problems with alcohol, declined referrals for follow-up and requested discharge. Mr. Joles remained on the [facility] unit for 2 days. He was discharged on the medications listed below. He denies any SI/HI/AVH and contracts for safety. He declines referral for follow up resources for mental health or alcohol use. He is discharging home.  Physical Findings: AIMS: Facial and Oral Movements Muscles of Facial Expression: None, normal Lips and Perioral Area: None, normal Jaw: None, normal  Tongue: None, normal,Extremity Movements Upper (arms, wrists, hands, fingers): None, normal Lower (legs, knees, ankles, toes): None, normal, Trunk Movements Neck, shoulders, hips: None, normal, Overall Severity Severity of abnormal movements (highest score from questions above): None, normal Incapacitation due to abnormal movements: None, normal Patient's awareness of abnormal movements (rate only patient's report): No Awareness, Dental Status Current problems with teeth and/or dentures?: No Does patient usually wear dentures?: No  CIWA:  CIWA-Ar Total: 1 COWS:     Musculoskeletal: Strength & Muscle Tone: within normal limits Gait & Station: normal Patient leans: N/A  Psychiatric Specialty Exam: Physical Exam  Nursing note and vitals reviewed. Constitutional: He is oriented to person, place, and time. He appears well-developed and well-nourished.  Cardiovascular: Normal rate.  Respiratory: Effort normal.  Neurological: He is alert and oriented to person, place, and time.    Review of Systems  Constitutional: Negative.   Psychiatric/Behavioral: Positive for substance abuse (ETOH). Negative for depression,  hallucinations, memory loss and suicidal ideas. The patient is not nervous/anxious and does not have insomnia.     Blood pressure 130/80, pulse (!) 104, temperature 98.1 F (36.7 C), temperature source Oral, resp. rate 18, height 5\' 10"  (1.778 m), weight 98.9 kg, SpO2 97 %.Body mass index is 31.28 kg/m.  General Appearance: Casual  Eye Contact:  Good  Speech:  Normal Rate  Volume:  Normal  Mood:  Euthymic  Affect:  Congruent  Thought Process:  Coherent  Orientation:  Full (Time, Place, and Person)  Thought Content:  WDL  Suicidal Thoughts:  No  Homicidal Thoughts:  No  Memory:  Immediate;   Fair Recent;   Fair  Judgement:  Fair  Insight:  Lacking  Psychomotor Activity:  Normal  Concentration:  Concentration: Fair  Recall:  [place] of Knowledge:  Fair  Language:  Fair  Akathisia:  No  Handed:  Right  AIMS (if indicated):     Assets:  Communication Skills Desire for Improvement Housing Resilience  ADL's:  Intact  Cognition:  WNL  Sleep:  Number of Hours: 6.75     Have you used any form of tobacco in the last 30 days? (Cigarettes, Smokeless Tobacco, Cigars, and/or Pipes): Yes  Has this patient used any form of tobacco in the last 30 days? (Cigarettes, Smokeless Tobacco, Cigars, and/or Pipes)No  Blood Alcohol level:  Lab Results  Component Value Date   ETH 309 (HH) 94/17/4081    Metabolic Disorder Labs:  Lab Results  Component Value Date   HGBA1C 4.7 (L) 02/01/2019   MPG 88.19 02/01/2019   No results found for: PROLACTIN Lab Results  Component Value Date   CHOL 192 02/01/2019   TRIG 65 02/01/2019   HDL 87 02/01/2019   CHOLHDL 2.2 02/01/2019   VLDL 13 02/01/2019   [place] 92 02/01/2019    See Psychiatric Specialty Exam and Suicide Risk Assessment completed by Attending Physician prior to discharge.  Discharge destination:  Home  Is patient on multiple antipsychotic therapies at discharge:  No   Has Patient had three or more failed trials of  antipsychotic monotherapy by history:  No  Recommended Plan for Multiple Antipsychotic Therapies: NA  Discharge Instructions    Discharge instructions   Complete by:  As directed    Patient is instructed to take all prescribed medications as recommended. Report any side effects or adverse reactions to your outpatient psychiatrist. Patient is instructed to abstain from alcohol and illegal drugs while on prescription medications. In the event of worsening symptoms,  patient is instructed to call the crisis hotline, 911, or go to the nearest emergency department for evaluation and treatment.     Allergies as of 02/03/2019   No Known Allergies     Medication List    STOP taking these medications   albuterol 108 (90 Base) MCG/ACT inhaler Commonly known as:  PROVENTIL HFA;VENTOLIN HFA   cetirizine 10 MG tablet Commonly known as:  ZYRTEC   fluticasone 50 MCG/ACT nasal spray Commonly known as:  FLONASE   nadolol 20 MG tablet Commonly known as:  CORGARD   Rolaids 550-110 MG Chew Generic drug:  Ca Carbonate-Mag Hydroxide   sertraline 50 MG tablet Commonly known as:  ZOLOFT     TAKE these medications     Indication  multivitamin with minerals Tabs tablet Take 1 tablet by mouth daily.  Indication:  Supplementation   naproxen sodium 220 MG tablet Commonly known as:  ALEVE Take 220 mg by mouth daily as needed (pain).  Indication:  Pain   omeprazole 20 MG capsule Commonly known as:  PRILOSEC TAKE 1 CAPSULE BY MOUTH TWICE DAILY 30 MINUTES BEFORE MEALS FOR 3 MONTHS AND THEN 1 CAPSULE DAILY.  Indication:  Gastroesophageal Reflux Disease      Follow-up Information    Patient declines Follow up.   Contact information: Declines outpatient referrals          Follow-up recommendations: Activity as tolerated. Diet as recommended by primary care physician. Keep all scheduled follow-up appointments as recommended.   Comments:   Patient is instructed to take all prescribed  medications as recommended. Report any side effects or adverse reactions to your outpatient psychiatrist. Patient is instructed to abstain from alcohol and illegal drugs while on prescription medications. In the event of worsening symptoms, patient is instructed to call the crisis hotline, 911, or go to the nearest emergency department for evaluation and treatment.  Signed: Connye Burkitt, NP 02/03/2019, 9:27 AM

## 2019-04-22 ENCOUNTER — Encounter: Payer: Self-pay | Admitting: Gastroenterology

## 2020-06-17 ENCOUNTER — Other Ambulatory Visit: Payer: Self-pay

## 2020-06-17 DIAGNOSIS — Z01818 Encounter for other preprocedural examination: Secondary | ICD-10-CM

## 2020-06-20 ENCOUNTER — Ambulatory Visit: Payer: Medicare Other | Admitting: Cardiology

## 2020-06-20 ENCOUNTER — Other Ambulatory Visit: Payer: Self-pay

## 2020-06-20 DIAGNOSIS — Z01818 Encounter for other preprocedural examination: Secondary | ICD-10-CM | POA: Insufficient documentation

## 2020-06-20 NOTE — Progress Notes (Signed)
EKG 06/20/2020: Sinus rhythm 78 bpm Normal EKG  This visit was for EKG only, as requested by his dentist. Patient was not seen by me.

## 2020-07-21 ENCOUNTER — Encounter (HOSPITAL_BASED_OUTPATIENT_CLINIC_OR_DEPARTMENT_OTHER): Payer: Self-pay | Admitting: Oral Surgery

## 2020-07-21 ENCOUNTER — Other Ambulatory Visit: Payer: Self-pay

## 2020-07-26 ENCOUNTER — Encounter (HOSPITAL_BASED_OUTPATIENT_CLINIC_OR_DEPARTMENT_OTHER)
Admission: RE | Admit: 2020-07-26 | Discharge: 2020-07-26 | Disposition: A | Discharge status: Home / Self Care | Payer: Medicare Other | Source: Ambulatory Visit | Attending: Oral Surgery | Admitting: Oral Surgery

## 2020-07-26 ENCOUNTER — Other Ambulatory Visit (HOSPITAL_COMMUNITY)
Admission: RE | Admit: 2020-07-26 | Discharge: 2020-07-26 | Disposition: A | Discharge status: Home / Self Care | Payer: Medicare Other | Source: Ambulatory Visit | Attending: Oral Surgery | Admitting: Oral Surgery

## 2020-07-26 DIAGNOSIS — Z20822 Contact with and (suspected) exposure to covid-19: Secondary | ICD-10-CM | POA: Insufficient documentation

## 2020-07-26 DIAGNOSIS — Z01812 Encounter for preprocedural laboratory examination: Secondary | ICD-10-CM | POA: Insufficient documentation

## 2020-07-26 LAB — COMPREHENSIVE METABOLIC PANEL
ALT: 16 U/L (ref 0–44)
AST: 19 U/L (ref 15–41)
Albumin: 3.8 g/dL (ref 3.5–5.0)
Alkaline Phosphatase: 60 U/L (ref 38–126)
Anion gap: 10 (ref 5–15)
BUN: 24 mg/dL — ABNORMAL HIGH (ref 6–20)
CO2: 26 mmol/L (ref 22–32)
Calcium: 8.8 mg/dL — ABNORMAL LOW (ref 8.9–10.3)
Chloride: 103 mmol/L (ref 98–111)
Creatinine, Ser: 1.11 mg/dL (ref 0.61–1.24)
GFR calc Af Amer: >60 mL/min (ref >60)
GFR calc non Af Amer: >60 mL/min (ref >60)
Glucose, Bld: 76 mg/dL (ref 70–99)
Potassium: 5.1 mmol/L (ref 3.5–5.1)
Sodium: 139 mmol/L (ref 135–145)
Total Bilirubin: 1 mg/dL (ref 0.3–1.2)
Total Protein: 6.5 g/dL (ref 6.5–8.1)

## 2020-07-26 NOTE — H&P (Signed)
HISTORY AND PHYSICAL  Kevin Nicholson is a 59 y.o. male patient with CC: Painful teeth. Referred by dentist for extractions.   No diagnosis found.  Past Medical History:  Diagnosis Date  . Ascites   . 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 Shortness of breath dyspnea    with stair climbing  . Ventral hernia     No current facility-administered medications for this encounter.   Current Outpatient Medications  Medication Sig Dispense Refill  . Multiple Vitamin (MULTIVITAMIN WITH MINERALS) TABS tablet Take 1 tablet by mouth daily.    . naproxen sodium (ALEVE) 220 MG tablet Take 220 mg by mouth daily as needed (pain).    Marland Kitchen testosterone enanthate (DELATESTRYL) 200 MG/ML injection Inject into the muscle every 14 (fourteen) days. For IM use only     No Known Allergies Active Problems:   * No active hospital problems. *  Vitals: Height 5\' 11"  (1.803 m), weight 117.9 kg. Lab results:No results found for this or any previous visit (from the past 21 hour(s)). Radiology Results: No results found. General appearance: alert, cooperative and moderately obese Head: Normocephalic, without obvious abnormality, atraumatic Eyes: negative Nose: Nares normal. Septum midline. Mucosa normal. No drainage or sinus tenderness. Throat: Multiple carious and periodontally involved teeth. No edema, purulence, fluctuance, or trismus. Pharynx clear.  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   Plan: Mujltiple extractions with alveoloplasty. GA, Day surgery.   Diona Browner 07/26/2020

## 2020-07-27 LAB — SARS CORONAVIRUS 2 (TAT 6-24 HRS): SARS Coronavirus 2: NEGATIVE

## 2020-07-27 NOTE — Anesthesia Preprocedure Evaluation (Addendum)
Anesthesia Evaluation  Patient identified by MRN, date of birth, ID band Patient awake    Reviewed: Allergy & Precautions, NPO status , Patient's Chart, lab work & pertinent test results  History of Anesthesia Complications Negative for: history of anesthetic complications  Airway Mallampati: II  TM Distance: >3 FB Neck ROM: Full    Dental  (+) Missing,    Pulmonary Current Smoker,    Pulmonary exam normal        Cardiovascular negative cardio ROS Normal cardiovascular exam     Neuro/Psych Depression negative neurological ROS     GI/Hepatic PUD, (+) Cirrhosis   Esophageal Varices  substance abuse  marijuana use,   Endo/Other  BMI 36  Renal/GU negative Renal ROS  negative genitourinary   Musculoskeletal negative musculoskeletal ROS (+)   Abdominal   Peds  Hematology negative hematology ROS (+)   Anesthesia Other Findings Day of surgery medications reviewed with patient.  Reproductive/Obstetrics negative OB ROS                            Anesthesia Physical Anesthesia Plan  ASA: III  Anesthesia Plan: General   Post-op Pain Management:    Induction: Intravenous  PONV Risk Score and Plan: 1 and Treatment may vary due to age or medical condition, Ondansetron, Dexamethasone and Midazolam  Airway Management Planned: Oral ETT  Additional Equipment: None  Intra-op Plan:   Post-operative Plan: Extubation in OR  Informed Consent: I have reviewed the patients History and Physical, chart, labs and discussed the procedure including the risks, benefits and alternatives for the proposed anesthesia with the patient or authorized representative who has indicated his/her understanding and acceptance.     Dental advisory given  Plan Discussed with: CRNA  Anesthesia Plan Comments: (No INR available. No nasal ETT.)       Anesthesia Quick Evaluation

## 2020-07-28 ENCOUNTER — Ambulatory Visit (HOSPITAL_BASED_OUTPATIENT_CLINIC_OR_DEPARTMENT_OTHER)
Admission: RE | Admit: 2020-07-28 | Discharge: 2020-07-28 | Disposition: A | Discharge status: Home / Self Care | Payer: Medicare Other | Attending: Oral Surgery | Admitting: Oral Surgery

## 2020-07-28 ENCOUNTER — Ambulatory Visit (HOSPITAL_BASED_OUTPATIENT_CLINIC_OR_DEPARTMENT_OTHER): Payer: Medicare Other | Admitting: Anesthesiology

## 2020-07-28 ENCOUNTER — Other Ambulatory Visit: Payer: Self-pay

## 2020-07-28 ENCOUNTER — Encounter (HOSPITAL_BASED_OUTPATIENT_CLINIC_OR_DEPARTMENT_OTHER)
Admission: RE | Disposition: A | Discharge status: Home / Self Care | Payer: Self-pay | Source: Home / Self Care | Attending: Oral Surgery

## 2020-07-28 DIAGNOSIS — K7460 Unspecified cirrhosis of liver: Secondary | ICD-10-CM | POA: Insufficient documentation

## 2020-07-28 DIAGNOSIS — K029 Dental caries, unspecified: Secondary | ICD-10-CM | POA: Diagnosis present

## 2020-07-28 DIAGNOSIS — K056 Periodontal disease, unspecified: Secondary | ICD-10-CM | POA: Insufficient documentation

## 2020-07-28 DIAGNOSIS — I8500 Esophageal varices without bleeding: Secondary | ICD-10-CM | POA: Insufficient documentation

## 2020-07-28 HISTORY — PX: MULTIPLE EXTRACTIONS WITH ALVEOLOPLASTY: SHX5342

## 2020-07-28 SURGERY — MULTIPLE EXTRACTION WITH ALVEOLOPLASTY
Anesthesia: General | Site: Mouth | Laterality: Bilateral

## 2020-07-28 MED ORDER — CEFAZOLIN SODIUM-DEXTROSE 2-4 GM/100ML-% IV SOLN
INTRAVENOUS | Status: AC
  Filled 2020-07-28: qty 100

## 2020-07-28 MED ORDER — SUGAMMADEX SODIUM 500 MG/5ML IV SOLN
INTRAVENOUS | Status: AC
  Filled 2020-07-28: qty 5

## 2020-07-28 MED ORDER — THROMBIN 5000 UNITS EX SOLR
CUTANEOUS | Status: DC | PRN
  Administered 2020-07-28: 5000 [IU] via TOPICAL

## 2020-07-28 MED ORDER — LACTATED RINGERS IV SOLN: INTRAVENOUS | Status: DC

## 2020-07-28 MED ORDER — FENTANYL CITRATE (PF) 250 MCG/5ML IJ SOLN
INTRAMUSCULAR | Status: DC | PRN
Start: 2020-07-28 — End: 2020-07-28
  Administered 2020-07-28: 100 ug via INTRAVENOUS

## 2020-07-28 MED ORDER — SUGAMMADEX SODIUM 200 MG/2ML IV SOLN
INTRAVENOUS | Status: DC | PRN
  Administered 2020-07-28: 200 mg via INTRAVENOUS

## 2020-07-28 MED ORDER — LIDOCAINE 2% (20 MG/ML) 5 ML SYRINGE
INTRAMUSCULAR | Status: DC | PRN
  Administered 2020-07-28: 100 mg via INTRAVENOUS

## 2020-07-28 MED ORDER — OXYCODONE-ACETAMINOPHEN 5-325 MG PO TABS: 1.0000 | ORAL_TABLET | ORAL | 0 refills | Status: AC | PRN

## 2020-07-28 MED ORDER — ALBUTEROL SULFATE (2.5 MG/3ML) 0.083% IN NEBU
2.5000 mg | INHALATION_SOLUTION | Freq: Four times a day (QID) | RESPIRATORY_TRACT | Status: DC | PRN
  Administered 2020-07-28: 2.5 mg via RESPIRATORY_TRACT

## 2020-07-28 MED ORDER — CEFAZOLIN SODIUM-DEXTROSE 2-4 GM/100ML-% IV SOLN
2.0000 g | INTRAVENOUS | Status: AC
  Administered 2020-07-28: 2 g via INTRAVENOUS

## 2020-07-28 MED ORDER — ONDANSETRON HCL 4 MG/2ML IJ SOLN
INTRAMUSCULAR | Status: AC
  Filled 2020-07-28: qty 2

## 2020-07-28 MED ORDER — OXYCODONE HCL 5 MG PO TABS: 5.0000 mg | ORAL_TABLET | Freq: Once | ORAL | Status: DC | PRN

## 2020-07-28 MED ORDER — PROPOFOL 10 MG/ML IV BOLUS
INTRAVENOUS | Status: AC
  Filled 2020-07-28: qty 20

## 2020-07-28 MED ORDER — ALBUTEROL SULFATE HFA 108 (90 BASE) MCG/ACT IN AERS
INHALATION_SPRAY | RESPIRATORY_TRACT | Status: DC | PRN
  Administered 2020-07-28: 6 via RESPIRATORY_TRACT

## 2020-07-28 MED ORDER — ROCURONIUM BROMIDE 10 MG/ML (PF) SYRINGE
PREFILLED_SYRINGE | INTRAVENOUS | Status: AC
  Filled 2020-07-28: qty 10

## 2020-07-28 MED ORDER — PROPOFOL 10 MG/ML IV BOLUS
INTRAVENOUS | Status: DC | PRN
  Administered 2020-07-28: 200 mg via INTRAVENOUS

## 2020-07-28 MED ORDER — LIDOCAINE-EPINEPHRINE 2 %-1:100000 IJ SOLN
INTRAMUSCULAR | Status: DC | PRN
  Administered 2020-07-28: 20 mL via INTRADERMAL

## 2020-07-28 MED ORDER — OXYCODONE HCL 5 MG/5ML PO SOLN: 5.0000 mg | Freq: Once | ORAL | Status: DC | PRN

## 2020-07-28 MED ORDER — AMOXICILLIN 500 MG PO CAPS: 500.0000 mg | ORAL_CAPSULE | Freq: Three times a day (TID) | ORAL | 0 refills | Status: AC

## 2020-07-28 MED ORDER — FENTANYL CITRATE (PF) 100 MCG/2ML IJ SOLN
INTRAMUSCULAR | Status: AC
  Filled 2020-07-28: qty 2

## 2020-07-28 MED ORDER — LIDOCAINE 2% (20 MG/ML) 5 ML SYRINGE
INTRAMUSCULAR | Status: AC
  Filled 2020-07-28: qty 5

## 2020-07-28 MED ORDER — THROMBIN 5000 UNITS EX SOLR
CUTANEOUS | Status: AC
  Filled 2020-07-28: qty 5000

## 2020-07-28 MED ORDER — ONDANSETRON HCL 4 MG/2ML IJ SOLN
INTRAMUSCULAR | Status: DC | PRN
  Administered 2020-07-28: 4 mg via INTRAVENOUS

## 2020-07-28 MED ORDER — ROCURONIUM BROMIDE 10 MG/ML (PF) SYRINGE
PREFILLED_SYRINGE | INTRAVENOUS | Status: DC | PRN
  Administered 2020-07-28: 50 mg via INTRAVENOUS

## 2020-07-28 MED ORDER — DEXMEDETOMIDINE (PRECEDEX) IN NS 20 MCG/5ML (4 MCG/ML) IV SYRINGE
PREFILLED_SYRINGE | INTRAVENOUS | Status: AC
  Filled 2020-07-28: qty 5

## 2020-07-28 MED ORDER — SODIUM CHLORIDE 0.9 % IV SOLN
INTRAVENOUS | Status: AC | PRN
  Administered 2020-07-28: 500 mL

## 2020-07-28 MED ORDER — FENTANYL CITRATE (PF) 100 MCG/2ML IJ SOLN
25.0000 ug | INTRAMUSCULAR | Status: DC | PRN
  Administered 2020-07-28: 25 ug via INTRAVENOUS

## 2020-07-28 MED ORDER — DEXAMETHASONE SODIUM PHOSPHATE 10 MG/ML IJ SOLN
INTRAMUSCULAR | Status: DC | PRN
  Administered 2020-07-28: 5 mg via INTRAVENOUS

## 2020-07-28 MED ORDER — PROMETHAZINE HCL 25 MG/ML IJ SOLN: 6.2500 mg | INTRAMUSCULAR | Status: DC | PRN

## 2020-07-28 MED ORDER — ALBUTEROL SULFATE (2.5 MG/3ML) 0.083% IN NEBU
INHALATION_SOLUTION | RESPIRATORY_TRACT | Status: AC
  Filled 2020-07-28: qty 3

## 2020-07-28 SURGICAL SUPPLY — 40 items
BLADE SURG 15 STRL LF DISP TIS (BLADE) ×2 IMPLANT
BLADE SURG 15 STRL SS (BLADE) ×4
BNDG EYE OVAL (GAUZE/BANDAGES/DRESSINGS) ×6 IMPLANT
BUR CROSS CUT FISSURE 1.6 (BURR) ×3 IMPLANT
BUR CROSS CUT FISSURE 1.6MM (BURR) ×1
BUR EGG ELITE 4.0 (BURR) ×2 IMPLANT
BUR EGG ELITE 4.0MM (BURR) ×1
CANISTER SUCT 1200ML W/VALVE (MISCELLANEOUS) ×4 IMPLANT
COVER BACK TABLE 60X90IN (DRAPES) ×4 IMPLANT
COVER MAYO STAND STRL (DRAPES) ×4 IMPLANT
DECANTER SPIKE VIAL GLASS SM (MISCELLANEOUS) ×3 IMPLANT
DRAPE U-SHAPE 76X120 STRL (DRAPES) ×4 IMPLANT
ELECT REM PT RETURN 9FT ADLT (ELECTROSURGICAL) ×4
ELECTRODE REM PT RTRN 9FT ADLT (ELECTROSURGICAL) ×1 IMPLANT
GAUZE PACKING FOLDED 2  STR (GAUZE/BANDAGES/DRESSINGS) ×4
GAUZE PACKING FOLDED 2 STR (GAUZE/BANDAGES/DRESSINGS) ×2 IMPLANT
GLOVE BIO SURGEON STRL SZ 6.5 (GLOVE) ×3 IMPLANT
GLOVE BIO SURGEON STRL SZ7.5 (GLOVE) ×4 IMPLANT
GLOVE BIO SURGEONS STRL SZ 6.5 (GLOVE) ×1
GLOVE BIOGEL PI IND STRL 6.5 (GLOVE) ×2 IMPLANT
GLOVE BIOGEL PI INDICATOR 6.5 (GLOVE) ×2
IV NS 500ML (IV SOLUTION) ×4
IV NS 500ML BAXH (IV SOLUTION) ×2 IMPLANT
NDL HYPO 25X1 1.5 SAFETY (NEEDLE) IMPLANT
NEEDLE HYPO 22GX1.5 SAFETY (NEEDLE) ×4 IMPLANT
NEEDLE HYPO 25X1 1.5 SAFETY (NEEDLE) ×4 IMPLANT
NS IRRIG 1000ML POUR BTL (IV SOLUTION) ×4 IMPLANT
PACK BASIN DAY SURGERY FS (CUSTOM PROCEDURE TRAY) ×4 IMPLANT
PENCIL SMOKE EVACUATOR (MISCELLANEOUS) ×3 IMPLANT
SLEEVE IRRIGATION ELITE 7 (MISCELLANEOUS) ×4 IMPLANT
SLEEVE SCD COMPRESS KNEE MED (MISCELLANEOUS) ×3 IMPLANT
SPONGE SURGIFOAM ABS GEL 12-7 (HEMOSTASIS) ×3 IMPLANT
SUT CHROMIC 3 0 PS 2 (SUTURE) ×4 IMPLANT
SYR BULB EAR ULCER 3OZ GRN STR (SYRINGE) ×4 IMPLANT
SYR CONTROL 10ML LL (SYRINGE) ×4 IMPLANT
TOWEL GREEN STERILE FF (TOWEL DISPOSABLE) ×4 IMPLANT
TUBE CONNECTING 20'X1/4 (TUBING) ×1
TUBE CONNECTING 20X1/4 (TUBING) ×3 IMPLANT
TUBING IRRIGATION (MISCELLANEOUS) ×4 IMPLANT
YANKAUER SUCT BULB TIP NO VENT (SUCTIONS) ×4 IMPLANT

## 2020-07-28 NOTE — H&P (Signed)
H&P documentation  -History and Physical Reviewed  -Patient has been re-examined  -No change in the plan of care  Kevin Nicholson  

## 2020-07-28 NOTE — Anesthesia Procedure Notes (Signed)
Procedure Name: Intubation Date/Time: 07/28/2020 12:47 PM Performed by: Myna Bright, CRNA Pre-anesthesia Checklist: Patient identified, Emergency Drugs available, Suction available and Patient being monitored Patient Re-evaluated:Patient Re-evaluated prior to induction Oxygen Delivery Method: Circle system utilized Preoxygenation: Pre-oxygenation with 100% oxygen Induction Type: IV induction Ventilation: Mask ventilation without difficulty and Oral airway inserted - appropriate to patient size Laryngoscope Size: Mac and 4 Grade View: Grade I Tube type: Oral Tube size: 7.5 mm Number of attempts: 1 Airway Equipment and Method: Stylet Placement Confirmation: ETT inserted through vocal cords under direct vision,  positive ETCO2 and breath sounds checked- equal and bilateral Secured at: 22 cm Tube secured with: Tape Dental Injury: Teeth and Oropharynx as per pre-operative assessment

## 2020-07-28 NOTE — Discharge Instructions (Signed)

## 2020-07-28 NOTE — Transfer of Care (Signed)
Immediate Anesthesia Transfer of Care Note  Patient: Kevin Nicholson  Procedure(s) Performed: MULTIPLE EXTRACTIONS WITH ALVEOLOPLASTY (Bilateral Mouth)  Patient Location: PACU  Anesthesia Type:General  Level of Consciousness: awake, alert , oriented and patient cooperative  Airway & Oxygen Therapy: Patient Spontanous Breathing and Patient connected to face mask oxygen  Post-op Assessment: Report given to RN, Post -op Vital signs reviewed and stable and Patient moving all extremities  Post vital signs: Reviewed and stable  Last Vitals:  Vitals Value Taken Time  BP    Temp    Pulse 78 07/28/20 1349  Resp 13 07/28/20 1349  SpO2 100 % 07/28/20 1349  Vitals shown include unvalidated device data.  Last Pain:  Vitals:   07/28/20 1134  TempSrc: Oral  PainSc: 0-No pain         Complications: No complications documented.

## 2020-07-28 NOTE — Anesthesia Postprocedure Evaluation (Signed)
Anesthesia Post Note  Patient: Marisol Glazer Farnworth  Procedure(s) Performed: MULTIPLE EXTRACTIONS WITH ALVEOLOPLASTY (Bilateral Mouth)     Patient location during evaluation: PACU Anesthesia Type: General Level of consciousness: sedated Pain management: pain level controlled Vital Signs Assessment: post-procedure vital signs reviewed and stable Respiratory status: spontaneous breathing and respiratory function stable Cardiovascular status: stable Postop Assessment: no apparent nausea or vomiting Anesthetic complications: no   No complications documented.  Last Vitals:  Vitals:   07/28/20 1426 07/28/20 1436  BP: (!) 147/83 (!) 153/85  Pulse: 75 73  Resp: 13 16  Temp:  36.7 C  SpO2: 98% 96%    Last Pain:  Vitals:   07/28/20 1436  TempSrc:   PainSc: 3                  [place] Ramone DANIEL

## 2020-07-28 NOTE — Op Note (Signed)
07/28/2020  1:44 PM  PATIENT:  Posey Boyer Mccadden  59 y.o. male  PRE-OPERATIVE DIAGNOSIS:  NONRESTORABLE TEETH #4, 9, 10,12, 13, 20, 21, 22, 26, 27, 28, 29 SECONDARY TO DENTAL CARIES AND PERIODONTAL DISEASE  POST-OPERATIVE DIAGNOSIS:  SAME  PROCEDURE:  Procedure(s): MULTIPLE EXTRACTION TEETH #4, 9, 10,12, 13, 20, 21, 22, 26, 27, 28, 29 WITH ALVEOLOPLASTY  SURGEON:  Surgeon(s): Diona Browner, DDS  ANESTHESIA:   local and general  EBL:  minimal  DRAINS: none   SPECIMEN:  No Specimen  COUNTS:  YES  PLAN OF CARE: Discharge to home after PACU  PATIENT DISPOSITION:  PACU - hemodynamically stable.   PROCEDURE DETAILS: Dictation #[id number]  Gae Bon, DMD 07/28/2020 1:44 PM

## 2020-07-28 NOTE — Op Note (Signed)
Kevin Nicholson, Kevin Nicholson MEDICAL RECORD [id number] ACCOUNT [id number] DATE OF BIRTH:[birth year] FACILITY: [facility] LOCATION: [facility] PHYSICIAN:Lashan Gluth M. Alekxander Isola, DDS  OPERATIVE REPORT  DATE OF PROCEDURE:  07/28/2020  PREOPERATIVE DIAGNOSIS:  Nonrestorable teeth numbers 4, 9, 10, 12, 13, 20, 21, 22, 26, 27, 28, 29 secondary to dental caries and periodontal disease.  PROCEDURE:   1.  Extraction of teeth numbers 4, 9, 10, 12, 13, 20, 21, 22, 26, 27, 28, 29. 2.  Alveoloplasty left maxilla, left mandible and right mandible.  SURGEON:  Diona Browner, DDS  ANESTHESIA:  General oral intubation, Dr. Esperanza Richters attending.  DESCRIPTION OF PROCEDURE:  The patient was taken to the operating room and placed on the table in supine position.  General anesthesia was administered and an oral endotracheal tube was placed and secured.  The eyes were protected and the patient was  draped for surgery.  A timeout was performed.  The posterior pharynx was suctioned and a throat pack was placed.  Two percent lidocaine, 1:100,000 epinephrine was infiltrated in an inferior alveolar block on the right and left sides and in buccal and  palatal infiltration in the maxilla.  Additional anesthesia was administered buccally in the right and left mandible around the teeth to be removed.  A bite block was placed on the right side of the mouth and a sweetheart retractor was used to retract  the tongue.  A #15 blade was used to make an incision along the alveolar crest in the area of tooth #19 and carried forward around teeth numbers 20, 21, 22.  In the buccal and lingual gingival sulcus, it was carried to the midline along the alveolar  crest.  The periosteum was reflected.  However, the tissue was friable and very thin in certain areas.  Reflection of the tissues caused some tearing of the tissues.  The teeth were elevated with a 301 elevator.  The Ash forceps was used to remove tooth  #20.  However, teeth numbers 21 and 22  fractured upon attempted removal, necessitating removal of bone in the apex of these teeth so that the root tip could be removed.  Once the root tip was removed, the periosteum was reflected to expose the alveolar  crest.  The sockets were curetted and then an alveoplasty was performed.  Then, attention was turned to the left maxilla.  The 15 blade was used to make an incision around teeth numbers 13, 12, 10 and 9.  The periosteum was reflected.  The teeth were  elevated and removed with the dental forceps.  The sockets were curetted.  The periosteum was reflected to expose the alveolar crest.  Alveoplasty was then performed on the left maxilla using the Stryker handpiece with the egg-shaped bur under irrigation  and then the bone file was used to further smooth these areas.  Then, this incision was closed with 3-0 chromic.  In the left mandible, alveoplasty was performed with the egg bur and bone file under irrigation and then the area was closed with 3-0  chromic.  Then, the bite block and sweetheart retractor were removed.  The endotracheal tube was repositioned to the other side of the mouth and then attention was turned to the right mandible.  A 15 blade was used to make an incision in the buccal and  lingual sulcus of teeth numbers 26, 27, 28, 29 in the mandible and around tooth #24 in the maxilla.  The teeth were elevated and tooth #4 was removed with the dental forceps.  Teeth numbers 26 and 28 were removed using the dental forceps and then teeth  numbers 27 and 29 fractured upon removal, necessitating removal of additional bone around the root tips of these teeth.  Once that was done, the root tips were removed with a root tip pick.  Then, the sockets were curetted.  The periosteum was reflected  in the mandible to expose the alveolar crest, which was irregular in shape.  The egg-shaped bur with a Stryker handpiece under irrigation was used to perform the alveoplasty.  Then, the bone file was used to  further smooth the area and then the area was  irrigated and closed with 3-0 chromic.  In the right maxilla, a Gelfoam sponge was placed in the socket of tooth #4 and closure with 3-0 chromic was achieved.  The mouth was inspected.  There was found to be some mild to moderate bleeding in the left  mandible.  The incision of the left mandible was opened back up and a Bovie electrocautery was used to cauterize any bleeding areas.  Gelfoam sponges soaked in thrombin were placed along the incision and in the sockets of the teeth and then sutured in  place with a 3-0 chromic.  The bleeding seemed to be much more under control.  Then, the oral cavity was irrigated and suctioned.  The throat pack was removed.  The patient was left in care of anesthesia for extubation and transport to recovery room with  plans for discharge home through day surgery.  ESTIMATED BLOOD LOSS:  Minimal.  COMPLICATIONS:  None.  SPECIMENS:  None.  VN/NUANCE  D:07/28/2020 T:07/28/2020 JOB:[id number]

## 2020-07-29 ENCOUNTER — Encounter (HOSPITAL_BASED_OUTPATIENT_CLINIC_OR_DEPARTMENT_OTHER): Payer: Self-pay | Admitting: Oral Surgery

## 2020-08-04 NOTE — Addendum Note (Signed)
Addendum  created 08/04/20 1043 by Duane Boston, MD   Intraprocedure Staff edited

## 2020-12-19 ENCOUNTER — Encounter: Payer: Self-pay | Admitting: Internal Medicine

## 2023-03-05 ENCOUNTER — Other Ambulatory Visit: Payer: Self-pay | Admitting: Vascular Surgery

## 2023-03-05 DIAGNOSIS — R1903 Right lower quadrant abdominal swelling, mass and lump: Secondary | ICD-10-CM

## 2023-03-14 ENCOUNTER — Other Ambulatory Visit: Payer: Self-pay | Admitting: Vascular Surgery

## 2023-03-14 DIAGNOSIS — T1590XA Foreign body on external eye, part unspecified, unspecified eye, initial encounter: Secondary | ICD-10-CM

## 2023-03-14 DIAGNOSIS — R1903 Right lower quadrant abdominal swelling, mass and lump: Secondary | ICD-10-CM

## 2023-04-05 ENCOUNTER — Ambulatory Visit
Admission: RE | Admit: 2023-04-05 | Discharge: 2023-04-05 | Disposition: A | Discharge status: Home / Self Care | Payer: Medicare Other | Source: Ambulatory Visit | Attending: Vascular Surgery | Admitting: Vascular Surgery

## 2023-04-05 DIAGNOSIS — R1903 Right lower quadrant abdominal swelling, mass and lump: Secondary | ICD-10-CM

## 2023-04-05 DIAGNOSIS — T1590XA Foreign body on external eye, part unspecified, unspecified eye, initial encounter: Secondary | ICD-10-CM

## 2023-04-05 MED ORDER — GADOPICLENOL 0.5 MMOL/ML IV SOLN: 10.0000 mL | Freq: Once | INTRAVENOUS | Status: DC | PRN

## 2023-07-05 ENCOUNTER — Telehealth: Payer: Self-pay

## 2023-07-05 NOTE — Patient Outreach (Signed)
  Care Coordination   07/05/2023 Name: Kevin Nicholson MRN: [id number] DOB: July 27, 1961   Care Coordination Outreach Attempts:  An unsuccessful telephone outreach was attempted today to offer the patient information about available care coordination services.  Follow Up Plan:  Additional outreach attempts will be made to offer the patient care coordination information and services.   Encounter Outcome:  No Answer   Care Coordination Interventions:  No, not indicated    Rowe Pavy, RN, BSN, [facility] [phone]
